# Patient Record
Sex: Female | Born: 1955 | Race: Black or African American | Hispanic: No | Smoking: Current every day smoker
Health system: Southern US, Community
[De-identification: ages and names within clinical notes are randomized; demographics above are authoritative.]

## PROBLEM LIST (undated history)

## (undated) DIAGNOSIS — I10 Essential (primary) hypertension: Secondary | ICD-10-CM

## (undated) DIAGNOSIS — G35 Multiple sclerosis: Secondary | ICD-10-CM

## (undated) DIAGNOSIS — M810 Age-related osteoporosis without current pathological fracture: Secondary | ICD-10-CM

---

## 1999-02-16 ENCOUNTER — Encounter: Payer: Self-pay | Admitting: Family Medicine

## 1999-02-16 ENCOUNTER — Ambulatory Visit (HOSPITAL_COMMUNITY): Admission: RE | Admit: 1999-02-16 | Discharge: 1999-02-16 | Payer: Self-pay | Admitting: Pediatrics

## 1999-03-19 ENCOUNTER — Ambulatory Visit (HOSPITAL_COMMUNITY): Admission: RE | Admit: 1999-03-19 | Discharge: 1999-03-19 | Payer: Self-pay | Admitting: Family Medicine

## 1999-05-08 ENCOUNTER — Encounter: Payer: Self-pay | Admitting: Neurology

## 1999-05-08 ENCOUNTER — Ambulatory Visit (HOSPITAL_COMMUNITY): Admission: RE | Admit: 1999-05-08 | Discharge: 1999-05-08 | Payer: Self-pay | Admitting: Neurology

## 2012-08-06 DIAGNOSIS — J449 Chronic obstructive pulmonary disease, unspecified: Secondary | ICD-10-CM | POA: Insufficient documentation

## 2012-08-06 DIAGNOSIS — E781 Pure hyperglyceridemia: Secondary | ICD-10-CM | POA: Insufficient documentation

## 2012-08-06 DIAGNOSIS — I10 Essential (primary) hypertension: Secondary | ICD-10-CM | POA: Insufficient documentation

## 2015-10-07 DIAGNOSIS — G500 Trigeminal neuralgia: Secondary | ICD-10-CM | POA: Diagnosis present

## 2016-01-13 DIAGNOSIS — G4700 Insomnia, unspecified: Secondary | ICD-10-CM | POA: Insufficient documentation

## 2016-01-13 DIAGNOSIS — I6930 Unspecified sequelae of cerebral infarction: Secondary | ICD-10-CM | POA: Insufficient documentation

## 2016-01-13 DIAGNOSIS — F17200 Nicotine dependence, unspecified, uncomplicated: Secondary | ICD-10-CM | POA: Insufficient documentation

## 2016-03-11 DIAGNOSIS — M722 Plantar fascial fibromatosis: Secondary | ICD-10-CM | POA: Insufficient documentation

## 2016-09-12 DIAGNOSIS — S4292XA Fracture of left shoulder girdle, part unspecified, initial encounter for closed fracture: Secondary | ICD-10-CM | POA: Insufficient documentation

## 2016-11-23 DIAGNOSIS — M810 Age-related osteoporosis without current pathological fracture: Secondary | ICD-10-CM | POA: Insufficient documentation

## 2016-11-23 DIAGNOSIS — M14671 Charcot's joint, right ankle and foot: Secondary | ICD-10-CM | POA: Insufficient documentation

## 2017-05-18 DIAGNOSIS — Z87311 Personal history of (healed) other pathological fracture: Secondary | ICD-10-CM | POA: Insufficient documentation

## 2017-05-18 DIAGNOSIS — E559 Vitamin D deficiency, unspecified: Secondary | ICD-10-CM | POA: Insufficient documentation

## 2018-03-22 ENCOUNTER — Emergency Department (HOSPITAL_BASED_OUTPATIENT_CLINIC_OR_DEPARTMENT_OTHER)
Admission: EM | Admit: 2018-03-22 | Discharge: 2018-03-22 | Disposition: A | Discharge status: Home / Self Care | Payer: Medicare HMO | Attending: Emergency Medicine | Admitting: Emergency Medicine

## 2018-03-22 ENCOUNTER — Other Ambulatory Visit: Payer: Self-pay

## 2018-03-22 ENCOUNTER — Encounter (HOSPITAL_BASED_OUTPATIENT_CLINIC_OR_DEPARTMENT_OTHER): Payer: Self-pay | Admitting: Emergency Medicine

## 2018-03-22 DIAGNOSIS — H5712 Ocular pain, left eye: Secondary | ICD-10-CM | POA: Diagnosis present

## 2018-03-22 DIAGNOSIS — F17210 Nicotine dependence, cigarettes, uncomplicated: Secondary | ICD-10-CM | POA: Insufficient documentation

## 2018-03-22 HISTORY — DX: Age-related osteoporosis without current pathological fracture: M81.0

## 2018-03-22 HISTORY — DX: Multiple sclerosis: G35

## 2018-03-22 MED ORDER — TRAMADOL HCL 50 MG PO TABS
50.0000 mg | ORAL_TABLET | Freq: Once | ORAL | Status: AC
  Administered 2018-03-22: 50 mg via ORAL
  Filled 2018-03-22: qty 1

## 2018-03-22 MED ORDER — PROPARACAINE HCL 0.5 % OP SOLN
1.0000 [drp] | Freq: Once | OPHTHALMIC | Status: AC
  Administered 2018-03-22: 1 [drp] via OPHTHALMIC
  Filled 2018-03-22: qty 15

## 2018-03-22 NOTE — ED Provider Notes (Signed)
[facility] EMERGENCY DEPARTMENT Provider Note   CSN: [id number] Arrival date & time: 03/22/18  1502     History   Chief Complaint Chief Complaint  Patient presents with  . Eye Pain    HPI Kerin Ransomva G Stanko is a 63 y.o. female.  The history is provided by the patient.  Eye Pain  This is a recurrent problem. The current episode started more than 2 days ago. The problem occurs daily. Progression since onset: intermittent. Pertinent negatives include no chest pain, no abdominal pain, no headaches and no shortness of breath. Exacerbated by: light. Nothing relieves the symptoms. Treatments tried: on prednisone. The treatment provided mild relief.    Past Medical History:  Diagnosis Date  . MS (multiple sclerosis) (HCC)   . Osteoporosis     There are no active problems to display for this patient.   History reviewed. No pertinent surgical history.   OB History   No obstetric history on file.      Home Medications    Prior to Admission medications   Not on File    Family History History reviewed. No pertinent family history.  Social History Social History   Tobacco Use  . Smoking status: Current Every Day Smoker    Packs/day: 0.50    Types: Cigarettes  . Smokeless tobacco: Never Used  Substance Use Topics  . Alcohol use: Never    Frequency: Never  . Drug use: Never     Allergies   Penicillins and Sulfa antibiotics   Review of Systems Review of Systems  Constitutional: Negative for chills and fever.  HENT: Negative for ear pain and sore throat.   Eyes: Positive for photophobia and pain. Negative for discharge, redness, itching and visual disturbance.  Respiratory: Negative for cough and shortness of breath.   Cardiovascular: Negative for chest pain and palpitations.  Gastrointestinal: Negative for abdominal pain and vomiting.  Genitourinary: Negative for dysuria and hematuria.  Musculoskeletal: Negative for arthralgias and back pain.  Skin:  Negative for color change and rash.  Neurological: Negative for seizures, syncope and headaches.  All other systems reviewed and are negative.    Physical Exam Updated Vital Signs BP (!) 164/90 (BP Location: Right Arm)   Pulse 72   Temp 97.8 F (36.6 C) (Oral)   Resp 16   Ht 5\' 3"  (1.6 m)   Wt 49 kg   SpO2 98%   BMI 19.13 kg/m   Physical Exam Vitals signs and nursing note reviewed.  Constitutional:      General: She is not in acute distress.    Appearance: She is well-developed.  HENT:     Head: Normocephalic and atraumatic.  Eyes:     General: Lids are normal.        Right eye: No discharge.        Left eye: No foreign body or discharge.     Intraocular pressure: Left eye pressure is 20 mmHg. Measurements were taken using a handheld tonometer.    Extraocular Movements: Extraocular movements intact.     Right eye: Normal extraocular motion.     Left eye: Normal extraocular motion.     Conjunctiva/sclera: Conjunctivae normal.     Right eye: Right conjunctiva is not injected. No chemosis.    Left eye: Left conjunctiva is not injected. No chemosis.    Pupils: Pupils are equal, round, and reactive to light.     Visual Fields: Right eye visual fields normal and left eye visual fields normal.  Comments: 20/50 vision b/l without glasses  Neck:     Musculoskeletal: Neck supple.  Cardiovascular:     Rate and Rhythm: Normal rate and regular rhythm.     Heart sounds: No murmur.  Pulmonary:     Effort: Pulmonary effort is normal. No respiratory distress.     Breath sounds: Normal breath sounds.  Abdominal:     Palpations: Abdomen is soft.     Tenderness: There is no abdominal tenderness.  Skin:    General: Skin is warm and dry.  Neurological:     Mental Status: She is alert.      ED Treatments / Results  Labs (all labs ordered are listed, but only abnormal results are displayed) Labs Reviewed - No data to display  EKG None  Radiology No results  found.  Procedures Procedures (including critical care time)  Medications Ordered in ED Medications  traMADol (ULTRAM) tablet 50 mg (has no administration in time range)  proparacaine (ALCAINE) 0.5 % ophthalmic solution 1 drop (1 drop Left Eye Given 03/22/18 1531)     Initial Impression / Assessment and Plan / ED Course  I have reviewed the triage vital signs and the nursing notes.  Pertinent labs & imaging results that were available during my care of the patient were reviewed by me and considered in my medical decision making (see chart for details).     ZYKERRIA GIBILISCO is a 63 year old female history of MS, trigeminal neuralgia who presents to the ED with left-sided head pain, left-sided eye pain.  Patient with normal vitals.  No fever.  Patient seen yesterday at outside emergency department for similar symptoms.  Was started on prednisone and was to follow-up with her ophthalmologist today.  Patient is being treated for trigeminal neuralgia.  She has no new vision changes.  She has normal pressure in the left eye.  She states that pain is similar to prior trigeminal neuralgia attacks.  Has used prednisone, tramadol in the past but does not have any tramadol.  Patient states that pain is intermittent.  She denies any loss of vision.  No history of any trauma to the eye.  She has no jaw pain.  No tenderness over the temporal artery.  Doubt temporal arteritis.  No fixed or dilated pupil.  No chemosis.  Normal eye pressure and doubt acute angle glaucoma.  Patient was given tramadol.  Encouraged her to follow-up with her ophthalmologist as well as previously instructed.  She would benefit from a dilated eye exam.  Likely trigeminal neuralgia.  Discharged from the ED in good condition given return precautions.  This chart was dictated using voice recognition software.  Despite best efforts to proofread,  errors can occur which can change the documentation meaning.   Final Clinical Impressions(s) /  ED Diagnoses   Final diagnoses:  Pain of left eye    ED Discharge Orders    None       Virgina Norfolk, DO 03/22/18 1552

## 2018-03-22 NOTE — ED Notes (Signed)
Discharge by Lupe Carney, RN

## 2018-03-22 NOTE — ED Triage Notes (Signed)
Reports left eye pain with history of MS.  States this episode started yesterday.  Reports photophobia.

## 2018-06-18 ENCOUNTER — Inpatient Hospital Stay: Admit: 2018-06-18 | Payer: Medicare HMO | Admitting: Orthopedic Surgery

## 2018-06-18 ENCOUNTER — Encounter (HOSPITAL_BASED_OUTPATIENT_CLINIC_OR_DEPARTMENT_OTHER): Payer: Self-pay | Admitting: Emergency Medicine

## 2018-06-18 ENCOUNTER — Observation Stay (HOSPITAL_BASED_OUTPATIENT_CLINIC_OR_DEPARTMENT_OTHER)
Admission: EM | Admit: 2018-06-18 | Discharge: 2018-06-19 | Disposition: A | Discharge status: Home / Self Care | Payer: Medicare HMO | Attending: Orthopedic Surgery | Admitting: Orthopedic Surgery

## 2018-06-18 ENCOUNTER — Encounter (HOSPITAL_COMMUNITY)
Admission: EM | Disposition: A | Discharge status: Home / Self Care | Payer: Self-pay | Source: Home / Self Care | Attending: Emergency Medicine

## 2018-06-18 ENCOUNTER — Other Ambulatory Visit: Payer: Self-pay

## 2018-06-18 ENCOUNTER — Emergency Department (HOSPITAL_BASED_OUTPATIENT_CLINIC_OR_DEPARTMENT_OTHER): Payer: Medicare HMO

## 2018-06-18 ENCOUNTER — Emergency Department (HOSPITAL_COMMUNITY): Payer: Medicare HMO | Admitting: Anesthesiology

## 2018-06-18 DIAGNOSIS — M810 Age-related osteoporosis without current pathological fracture: Secondary | ICD-10-CM | POA: Diagnosis not present

## 2018-06-18 DIAGNOSIS — F17210 Nicotine dependence, cigarettes, uncomplicated: Secondary | ICD-10-CM | POA: Diagnosis not present

## 2018-06-18 DIAGNOSIS — S62345A Nondisplaced fracture of base of fourth metacarpal bone, left hand, initial encounter for closed fracture: Secondary | ICD-10-CM | POA: Diagnosis present

## 2018-06-18 DIAGNOSIS — W06XXXA Fall from bed, initial encounter: Secondary | ICD-10-CM | POA: Insufficient documentation

## 2018-06-18 DIAGNOSIS — Z882 Allergy status to sulfonamides status: Secondary | ICD-10-CM | POA: Diagnosis not present

## 2018-06-18 DIAGNOSIS — S63269A Dislocation of metacarpophalangeal joint of unspecified finger, initial encounter: Secondary | ICD-10-CM

## 2018-06-18 DIAGNOSIS — S63261A Dislocation of metacarpophalangeal joint of left index finger, initial encounter: Principal | ICD-10-CM | POA: Insufficient documentation

## 2018-06-18 DIAGNOSIS — S6292XA Unspecified fracture of left wrist and hand, initial encounter for closed fracture: Secondary | ICD-10-CM | POA: Diagnosis present

## 2018-06-18 DIAGNOSIS — G35 Multiple sclerosis: Secondary | ICD-10-CM | POA: Diagnosis not present

## 2018-06-18 DIAGNOSIS — S62309A Unspecified fracture of unspecified metacarpal bone, initial encounter for closed fracture: Secondary | ICD-10-CM | POA: Diagnosis present

## 2018-06-18 DIAGNOSIS — Z880 Allergy status to penicillin: Secondary | ICD-10-CM | POA: Diagnosis not present

## 2018-06-18 DIAGNOSIS — S62323A Displaced fracture of shaft of third metacarpal bone, left hand, initial encounter for closed fracture: Secondary | ICD-10-CM | POA: Diagnosis present

## 2018-06-18 DIAGNOSIS — S52502A Unspecified fracture of the lower end of left radius, initial encounter for closed fracture: Secondary | ICD-10-CM

## 2018-06-18 HISTORY — PX: OPEN REDUCTION INTERNAL FIXATION (ORIF) METACARPAL: SHX6234

## 2018-06-18 SURGERY — OPEN REDUCTION INTERNAL FIXATION (ORIF) METACARPAL
Anesthesia: General | Laterality: Left

## 2018-06-18 MED ORDER — VANCOMYCIN HCL 1000 MG IV SOLR
INTRAVENOUS | Status: DC | PRN
  Administered 2018-06-18: 1000 mg via INTRAVENOUS

## 2018-06-18 MED ORDER — LACTATED RINGERS IV SOLN
INTRAVENOUS | Status: DC | PRN
  Administered 2018-06-18 (×2): via INTRAVENOUS

## 2018-06-18 MED ORDER — MIDAZOLAM HCL 2 MG/2ML IJ SOLN
INTRAMUSCULAR | Status: AC
  Filled 2018-06-18: qty 2

## 2018-06-18 MED ORDER — PROPOFOL 10 MG/ML IV BOLUS
INTRAVENOUS | Status: DC | PRN
  Administered 2018-06-18: 150 mg via INTRAVENOUS
  Administered 2018-06-18 (×2): 50 mg via INTRAVENOUS

## 2018-06-18 MED ORDER — FENTANYL CITRATE (PF) 250 MCG/5ML IJ SOLN
INTRAMUSCULAR | Status: AC
  Filled 2018-06-18: qty 5

## 2018-06-18 MED ORDER — BUPIVACAINE HCL (PF) 0.25 % IJ SOLN
INTRAMUSCULAR | Status: AC
  Filled 2018-06-18: qty 30

## 2018-06-18 MED ORDER — ONDANSETRON HCL 4 MG/2ML IJ SOLN
INTRAMUSCULAR | Status: DC | PRN
  Administered 2018-06-18: 4 mg via INTRAVENOUS

## 2018-06-18 MED ORDER — PROPOFOL 10 MG/ML IV BOLUS
INTRAVENOUS | Status: AC
  Filled 2018-06-18: qty 20

## 2018-06-18 MED ORDER — DEXAMETHASONE SODIUM PHOSPHATE 10 MG/ML IJ SOLN
INTRAMUSCULAR | Status: AC
  Filled 2018-06-18: qty 1

## 2018-06-18 MED ORDER — ALBUTEROL SULFATE (2.5 MG/3ML) 0.083% IN NEBU
INHALATION_SOLUTION | RESPIRATORY_TRACT | Status: AC
  Administered 2018-06-18: 2.5 mg via RESPIRATORY_TRACT
  Filled 2018-06-18: qty 3

## 2018-06-18 MED ORDER — 0.9 % SODIUM CHLORIDE (POUR BTL) OPTIME
TOPICAL | Status: DC | PRN
  Administered 2018-06-18: 1000 mL

## 2018-06-18 MED ORDER — LIDOCAINE HCL (PF) 1 % IJ SOLN
30.0000 mL | Freq: Once | INTRAMUSCULAR | Status: AC
  Administered 2018-06-18: 30 mL

## 2018-06-18 MED ORDER — HYDRALAZINE HCL 20 MG/ML IJ SOLN
5.0000 mg | INTRAMUSCULAR | Status: AC | PRN
  Administered 2018-06-18 – 2018-06-19 (×2): 2.5 mg via INTRAVENOUS

## 2018-06-18 MED ORDER — FENTANYL CITRATE (PF) 100 MCG/2ML IJ SOLN
INTRAMUSCULAR | Status: AC
  Administered 2018-06-18: 25 ug via INTRAVENOUS
  Filled 2018-06-18: qty 2

## 2018-06-18 MED ORDER — HYDROCODONE-ACETAMINOPHEN 5-325 MG PO TABS
1.0000 | ORAL_TABLET | Freq: Once | ORAL | Status: AC
  Administered 2018-06-18: 1 via ORAL
  Filled 2018-06-18: qty 1

## 2018-06-18 MED ORDER — SUCCINYLCHOLINE CHLORIDE 200 MG/10ML IV SOSY
PREFILLED_SYRINGE | INTRAVENOUS | Status: DC | PRN
  Administered 2018-06-18: 120 mg via INTRAVENOUS

## 2018-06-18 MED ORDER — DEXAMETHASONE SODIUM PHOSPHATE 10 MG/ML IJ SOLN
INTRAMUSCULAR | Status: DC | PRN
  Administered 2018-06-18: 10 mg via INTRAVENOUS

## 2018-06-18 MED ORDER — LIDOCAINE HCL (PF) 1 % IJ SOLN
5.0000 mL | Freq: Once | INTRAMUSCULAR | Status: AC
  Administered 2018-06-18: 5 mL
  Filled 2018-06-18: qty 5

## 2018-06-18 MED ORDER — METHOCARBAMOL 1000 MG/10ML IJ SOLN
500.0000 mg | Freq: Four times a day (QID) | INTRAVENOUS | Status: DC | PRN
  Filled 2018-06-18: qty 5

## 2018-06-18 MED ORDER — FENTANYL CITRATE (PF) 100 MCG/2ML IJ SOLN
25.0000 ug | INTRAMUSCULAR | Status: DC | PRN
  Administered 2018-06-18 – 2018-06-19 (×2): 25 ug via INTRAVENOUS

## 2018-06-18 MED ORDER — LIDOCAINE 2% (20 MG/ML) 5 ML SYRINGE
INTRAMUSCULAR | Status: AC
  Filled 2018-06-18: qty 5

## 2018-06-18 MED ORDER — METHOCARBAMOL 500 MG PO TABS: 500.0000 mg | ORAL_TABLET | Freq: Four times a day (QID) | ORAL | Status: DC | PRN

## 2018-06-18 MED ORDER — HYDRALAZINE HCL 20 MG/ML IJ SOLN
INTRAMUSCULAR | Status: AC
  Administered 2018-06-19: 2.5 mg via INTRAVENOUS
  Filled 2018-06-18: qty 1

## 2018-06-18 MED ORDER — VANCOMYCIN HCL IN DEXTROSE 1-5 GM/200ML-% IV SOLN
INTRAVENOUS | Status: AC
  Filled 2018-06-18: qty 200

## 2018-06-18 MED ORDER — LACTATED RINGERS IV SOLN
INTRAVENOUS | Status: DC
  Administered 2018-06-19: 05:00:00 via INTRAVENOUS

## 2018-06-18 MED ORDER — SUCCINYLCHOLINE CHLORIDE 200 MG/10ML IV SOSY
PREFILLED_SYRINGE | INTRAVENOUS | Status: AC
  Filled 2018-06-18: qty 10

## 2018-06-18 MED ORDER — FENTANYL CITRATE (PF) 100 MCG/2ML IJ SOLN
INTRAMUSCULAR | Status: DC | PRN
  Administered 2018-06-18 (×3): 50 ug via INTRAVENOUS
  Administered 2018-06-18: 100 ug via INTRAVENOUS

## 2018-06-18 MED ORDER — MIDAZOLAM HCL 5 MG/5ML IJ SOLN
INTRAMUSCULAR | Status: DC | PRN
  Administered 2018-06-18: 2 mg via INTRAVENOUS

## 2018-06-18 MED ORDER — VANCOMYCIN HCL 1000 MG IV SOLR
INTRAVENOUS | Status: AC
  Filled 2018-06-18: qty 1000

## 2018-06-18 MED ORDER — BUPIVACAINE HCL 0.5 % IJ SOLN
50.0000 mL | Freq: Once | INTRAMUSCULAR | Status: AC
  Administered 2018-06-18: 50 mL
  Filled 2018-06-18: qty 1

## 2018-06-18 MED ORDER — LIDOCAINE 2% (20 MG/ML) 5 ML SYRINGE
INTRAMUSCULAR | Status: DC | PRN
  Administered 2018-06-18: 100 mg via INTRAVENOUS

## 2018-06-18 MED ORDER — ALBUTEROL SULFATE (2.5 MG/3ML) 0.083% IN NEBU
2.5000 mg | INHALATION_SOLUTION | Freq: Four times a day (QID) | RESPIRATORY_TRACT | Status: AC | PRN
  Administered 2018-06-18: 2.5 mg via RESPIRATORY_TRACT

## 2018-06-18 SURGICAL SUPPLY — 79 items
BANDAGE ACE 3X5.8 VEL STRL LF (GAUZE/BANDAGES/DRESSINGS) ×3 IMPLANT
BANDAGE ACE 4X5 VEL STRL LF (GAUZE/BANDAGES/DRESSINGS) ×3 IMPLANT
BIT DRILL 2 FAST STEP (BIT) ×3 IMPLANT
BLADE CLIPPER SURG (BLADE) IMPLANT
BNDG ESMARK 4X9 LF (GAUZE/BANDAGES/DRESSINGS) ×3 IMPLANT
BNDG GAUZE ELAST 4 BULKY (GAUZE/BANDAGES/DRESSINGS) ×3 IMPLANT
CORDS BIPOLAR (ELECTRODE) ×3 IMPLANT
COVER SURGICAL LIGHT HANDLE (MISCELLANEOUS) ×3 IMPLANT
COVER WAND RF STERILE (DRAPES) ×3 IMPLANT
CUFF TOURNIQUET SINGLE 18IN (TOURNIQUET CUFF) ×3 IMPLANT
CUFF TOURNIQUET SINGLE 24IN (TOURNIQUET CUFF) IMPLANT
DRAIN TLS ROUND 10FR (DRAIN) IMPLANT
DRAPE OEC MINIVIEW 54X84 (DRAPES) IMPLANT
DRAPE SURG 17X23 STRL (DRAPES) ×3 IMPLANT
DRSG ADAPTIC 3X8 NADH LF (GAUZE/BANDAGES/DRESSINGS) ×3 IMPLANT
DRSG XEROFORM 1X8 (GAUZE/BANDAGES/DRESSINGS) ×3 IMPLANT
GAUZE SPONGE 4X4 12PLY STRL (GAUZE/BANDAGES/DRESSINGS) ×3 IMPLANT
GAUZE SPONGE 4X4 12PLY STRL LF (GAUZE/BANDAGES/DRESSINGS) ×3 IMPLANT
GAUZE XEROFORM 1X8 LF (GAUZE/BANDAGES/DRESSINGS) ×3 IMPLANT
GLOVE BIO SURGEON STRL SZ 6.5 (GLOVE) ×6 IMPLANT
GLOVE BIO SURGEON STRL SZ7 (GLOVE) ×6 IMPLANT
GLOVE BIO SURGEONS STRL SZ 6.5 (GLOVE) ×3
GLOVE BIOGEL M 8.0 STRL (GLOVE) ×3 IMPLANT
GLOVE BIOGEL PI IND STRL 6.5 (GLOVE) ×3 IMPLANT
GLOVE BIOGEL PI IND STRL 7.0 (GLOVE) ×1 IMPLANT
GLOVE BIOGEL PI INDICATOR 6.5 (GLOVE) ×6
GLOVE BIOGEL PI INDICATOR 7.0 (GLOVE) ×2
GLOVE SS BIOGEL STRL SZ 8 (GLOVE) ×1 IMPLANT
GLOVE SUPERSENSE BIOGEL SZ 8 (GLOVE) ×2
GLOVE SURG SS PI 7.0 STRL IVOR (GLOVE) ×3 IMPLANT
GOWN STRL REUS W/ TWL LRG LVL3 (GOWN DISPOSABLE) ×3 IMPLANT
GOWN STRL REUS W/ TWL XL LVL3 (GOWN DISPOSABLE) ×3 IMPLANT
GOWN STRL REUS W/TWL LRG LVL3 (GOWN DISPOSABLE) ×6
GOWN STRL REUS W/TWL XL LVL3 (GOWN DISPOSABLE) ×6
KIT BASIN OR (CUSTOM PROCEDURE TRAY) ×3 IMPLANT
KIT TURNOVER KIT B (KITS) ×3 IMPLANT
MANIFOLD NEPTUNE II (INSTRUMENTS) ×3 IMPLANT
NEEDLE 22X1 1/2 (OR ONLY) (NEEDLE) ×3 IMPLANT
NS IRRIG 1000ML POUR BTL (IV SOLUTION) ×3 IMPLANT
PACK ORTHO EXTREMITY (CUSTOM PROCEDURE TRAY) ×3 IMPLANT
PAD ARMBOARD 7.5X6 YLW CONV (MISCELLANEOUS) ×6 IMPLANT
PAD CAST 3X4 CTTN HI CHSV (CAST SUPPLIES) ×1 IMPLANT
PAD CAST 4YDX4 CTTN HI CHSV (CAST SUPPLIES) ×1 IMPLANT
PADDING CAST COTTON 3X4 STRL (CAST SUPPLIES) ×2
PADDING CAST COTTON 4X4 STRL (CAST SUPPLIES) ×2
PADDING CAST SYNTHETIC 4 (CAST SUPPLIES) ×2
PADDING CAST SYNTHETIC 4X4 STR (CAST SUPPLIES) ×1 IMPLANT
PILLOW ARM CARTER ADULT (MISCELLANEOUS) ×3 IMPLANT
PLATE LOCK 2.5MM Y-SHAPE (Plate) ×3 IMPLANT
PLATE LOCKING 1.5 Y SHAPE (Plate) ×3 IMPLANT
SCREW PEG 15MM (Screw) ×3 IMPLANT
SCREW PEG 2.5X12 NONLOCK (Screw) ×3 IMPLANT
SCREW PEG 2.5X20 NONLOCK (Screw) ×3 IMPLANT
SCREW PEG BONE N/L 2.5X11 (Screw) ×3 IMPLANT
SCREW PEG LOCK 2.5X10 (Peg) ×3 IMPLANT
SCREW PEG LOCK 2.5X12 (Screw) ×3 IMPLANT
SCREW PEG LOCK 2.5X16 (Peg) ×3 IMPLANT
SCREWDRIVER BIT 2.0/2.5 127MM (BIT) ×3 IMPLANT
SCRUB BETADINE 4OZ XXX (MISCELLANEOUS) ×3 IMPLANT
SOL PREP POV-IOD 4OZ 10% (MISCELLANEOUS) ×3 IMPLANT
SPLINT FIBERGLASS 4X30 (CAST SUPPLIES) ×3 IMPLANT
SPONGE LAP 4X18 RFD (DISPOSABLE) IMPLANT
SUT FIBERWIRE 4-0 18 TAPR NDL (SUTURE) ×3
SUT MNCRL AB 4-0 PS2 18 (SUTURE) ×3 IMPLANT
SUT PROLENE 3 0 PS 2 (SUTURE) IMPLANT
SUT PROLENE 4 0 PS 2 18 (SUTURE) ×12 IMPLANT
SUT VIC AB 3-0 FS2 27 (SUTURE) IMPLANT
SUT VIC AB 4-0 P-3 18X BRD (SUTURE) ×2 IMPLANT
SUT VIC AB 4-0 P3 18 (SUTURE) ×4
SUTURE FIBERWR 4-0 18 TAPR NDL (SUTURE) ×1 IMPLANT
SYR CONTROL 10ML LL (SYRINGE) ×3 IMPLANT
SYSTEM CHEST DRAIN TLS 7FR (DRAIN) IMPLANT
TOWEL OR 17X24 6PK STRL BLUE (TOWEL DISPOSABLE) ×3 IMPLANT
TOWEL OR 17X26 10 PK STRL BLUE (TOWEL DISPOSABLE) ×3 IMPLANT
TUBE CONNECTING 12'X1/4 (SUCTIONS) ×1
TUBE CONNECTING 12X1/4 (SUCTIONS) ×2 IMPLANT
TUBE EVACUATION TLS (MISCELLANEOUS) ×3 IMPLANT
WASHER 2.5 THREADED (Orthopedic Implant) ×3 IMPLANT
WATER STERILE IRR 1000ML POUR (IV SOLUTION) ×3 IMPLANT

## 2018-06-18 NOTE — ED Notes (Signed)
Contact made with pt's caregiver. Caregiver to come transport pt by POV to [facility].

## 2018-06-18 NOTE — ED Provider Notes (Signed)
[facility] EMERGENCY DEPARTMENT Provider Note   CSN: [id number] Arrival date & time: 06/18/18  1536    History   Chief Complaint Chief Complaint  Patient presents with  . Hand Injury    HPI Shawna Smith is a 63 y.o. female.     63 year old female presents with left hand pain.  Patient states that she was going to get out of bed last night and fell when she was trying to turn on her bedside light injuring her left hand.  Patient is right-hand dominant, ambulates with a cane in her right hand.  Patient is not on blood thinners, denies loss of consciousness, did not hit her head.  Pain primarily to her left index finger and neck and metacarpal.  No other injuries, complaints, concerns.     Past Medical History:  Diagnosis Date  . MS (multiple sclerosis) (HCC)   . Osteoporosis     There are no active problems to display for this patient.   History reviewed. No pertinent surgical history.   OB History   No obstetric history on file.      Home Medications    Prior to Admission medications   Not on File    Family History No family history on file.  Social History Social History   Tobacco Use  . Smoking status: Current Every Day Smoker    Packs/day: 0.50    Types: Cigarettes  . Smokeless tobacco: Never Used  Substance Use Topics  . Alcohol use: Never    Frequency: Never  . Drug use: Never     Allergies   Penicillins and Sulfa antibiotics   Review of Systems Review of Systems  Constitutional: Negative for fever.  Musculoskeletal: Positive for arthralgias and joint swelling.  Skin: Negative for color change, rash and wound.  Allergic/Immunologic: Negative for immunocompromised state.  Neurological: Negative for weakness and numbness.  Hematological: Does not bruise/bleed easily.  Psychiatric/Behavioral: Negative for confusion.  All other systems reviewed and are negative.    Physical Exam Updated Vital Signs BP (!) 202/97 (BP  Location: Right Arm)   Pulse 82   Temp 98.3 F (36.8 C) (Oral)   Resp 18   Ht  (1.727 m)   Wt 54.4 kg   SpO2 100%   BMI 18.25 kg/m   Physical Exam Vitals signs and nursing note reviewed.  Constitutional:      General: She is not in acute distress.    Appearance: She is well-developed. She is not diaphoretic.  HENT:     Head: Normocephalic and atraumatic.  Cardiovascular:     Pulses: Normal pulses.  Pulmonary:     Effort: Pulmonary effort is normal.  Musculoskeletal:        General: Swelling, tenderness, deformity and signs of injury present.       Hands:  Skin:    General: Skin is warm and dry.     Findings: Bruising present. No erythema or rash.  Neurological:     Mental Status: She is alert and oriented to person, place, and time.  Psychiatric:        Behavior: Behavior normal.      ED Treatments / Results  Labs (all labs ordered are listed, but only abnormal results are displayed) Labs Reviewed - No data to display  EKG None  Radiology Dg Hand Complete Left  Result Date: 06/18/2018 CLINICAL DATA:  63 year old female status post fall with left hand swelling and deformity. EXAM: LEFT HAND - COMPLETE 3+  VIEW COMPARISON:  Left wrist 11/10/2016. FINDINGS: Two thousand eighteen nondisplaced distal left radius fracture healing is felt to explain the irregular sclerosis in the distal left radius metadiaphysis since that time. The distal left radius and ulna now appear intact. Carpal bone alignment stable and within normal limits. There is a comminuted oblique fracture of the 4th metacarpal proximal metadiaphysis with volar angulation. The appearance is suspicious for nondisplaced extension to the 4th [facility] joint. Oblique comminuted fracture of the left 3rd metacarpal distal metadiaphysis, extra-articular. Mild ulnar displacement. Dorsal and ulnar dislocation of the left 2nd MCP joint, although a small round ossific fragment there is a small chronic sesamoid rather than  an acute fracture fragment. The 1st and 5th metacarpals are stable and intact. No phalanx fracture identified. IMPRESSION: 1. Dislocation of the 2nd MCP joint without acute fracture identified there. 2. Comminuted and mildly displaced and angulated fractures of the distal 3rd metacarpal (extra-articular) and proximal 4th metacarpal (probably intra-articular). 3. Remote distal left radius fracture. Electronically Signed   By: Odessa Fleming M.D.   On: 06/18/2018 16:31   Dg Finger Index Left  Result Date: 06/18/2018 CLINICAL DATA:  Post reduction films. EXAM: LEFT INDEX FINGER 2+V COMPARISON:  June 18, 2018 hand films. FINDINGS: There is continued dislocation of the index finger at the MCP joint. Fractures of the third and fourth metacarpals remain. IMPRESSION: Continued dislocation of the index finger at the MCP joint. Fractures of the third and fourth metacarpals remain. Electronically Signed   By: Gerome Sam III M.D   On: 06/18/2018 17:18    Procedures Procedures (including critical care time)  Medications Ordered in ED Medications  lidocaine (PF) (XYLOCAINE) 1 % injection 5 mL (5 mLs Infiltration Given 06/18/18 1649)  HYDROcodone-acetaminophen (NORCO/VICODIN) 5-325 MG per tablet 1 tablet (1 tablet Oral Given 06/18/18 1710)  lidocaine (PF) (XYLOCAINE) 1 % injection 30 mL (30 mLs Infiltration Given by Other 06/18/18 1715)  bupivacaine (MARCAINE) 0.5 % (with pres) injection 50 mL (50 mLs Infiltration Given by Other 06/18/18 1715)     Initial Impression / Assessment and Plan / ED Course  I have reviewed the triage vital signs and the nursing notes.  Pertinent labs & imaging results that were available during my care of the patient were reviewed by me and considered in my medical decision making (see chart for details).  Clinical Course as of Jun 18 1755  Sat Jun 18, 2018  1755 62yo female left hand injury after a fall which occurred last night.  On exam patient has swelling of her left hand with  deformity at the left second MCP, she is neurovascular intact.  X-ray shows dislocation at the left second MCP with fractures of the third and fourth metacarpals, distal radius.  Digital block to left second MCP with unsuccessful reduction attempt x2. Hematoma block done by Dr. Silverio Lay, ER attending with US guidance, block successful, unable to reduce dislocation. Care signed out pending ortho consult.    [LM]    Clinical Course User Index [LM] Jeannie Fend, PA-C        Final Clinical Impressions(s) / ED Diagnoses   Final diagnoses:  Dislocation, finger, metacarpophalangeal joint, initial encounter  Closed nondisplaced fracture of base of fourth metacarpal bone of left hand, initial encounter  Closed displaced fracture of shaft of third metacarpal bone of left hand, initial encounter  Closed fracture of distal end of left radius, unspecified fracture morphology, initial encounter    ED Discharge Orders    None  Jeannie FendMurphy,  A, PA-C 06/18/18 1757    Charlynne PanderYao, David Hsienta, MD 06/18/18 [phone]

## 2018-06-18 NOTE — Anesthesia Procedure Notes (Signed)
Procedure Name: Intubation Date/Time: 06/18/2018 9:27 PM Performed by: Babs Bertin, CRNA Pre-anesthesia Checklist: Patient identified, Emergency Drugs available, Suction available and Patient being monitored Patient Re-evaluated:Patient Re-evaluated prior to induction Oxygen Delivery Method: Circle System Utilized Preoxygenation: Pre-oxygenation with 100% oxygen Induction Type: IV induction Ventilation: Mask ventilation without difficulty Laryngoscope Size: Mac and 3 Grade View: Grade I Tube type: Oral Tube size: 7.0 mm Number of attempts: 1 Airway Equipment and Method: Stylet and Oral airway Placement Confirmation: ETT inserted through vocal cords under direct vision,  positive ETCO2 and breath sounds checked- equal and bilateral Secured at: 21 cm Tube secured with: Tape Dental Injury: Teeth and Oropharynx as per pre-operative assessment

## 2018-06-18 NOTE — ED Notes (Signed)
Pt ambulatory to front. Aware of her transfer POV to [facility].

## 2018-06-18 NOTE — H&P (Signed)
Shawna Smith is an 63 y.o. female.   Chief Complaint: 63 year old female presents with a reducible left index MCP dislocation, third and fourth metacarpal fractures displaced.  We are planning surgical reconstruction.    HPI: Patient presents for evaluation and treatment of the of their upper extremity predicament. The patient denies neck, back, chest or  abdominal pain. The patient notes that they have no lower extremity problems. The patients primary complaint is noted. We are planning surgical care pathway for the upper extremity.   The patient denies other complaints.  She fell yesterday and is been trying to endure the pain and swelling.  She was seen at [facility].  I asked him to transfer as soon as humanly possible so that we could give her some pain relief and correct her deformity.  She understands risk benefits surgery.  She states her multiple sclerosis is under fairly good control.  Past Medical History:  Diagnosis Date  . MS (multiple sclerosis) (HCC)   . Osteoporosis     History reviewed. No pertinent surgical history.  History reviewed. No pertinent family history. Social History:  reports that she has been smoking cigarettes. She has been smoking about 0.50 packs per day. She has never used smokeless tobacco. She reports that she does not drink alcohol or use drugs.  Allergies:  Allergies  Allergen Reactions  . Penicillins   . Sulfa Antibiotics     No medications prior to admission.    No results found for this or any previous visit (from the past 48 hour(s)). Dg Hand Complete Left  Result Date: 06/18/2018 CLINICAL DATA:  63 year old female status post fall with left hand swelling and deformity. EXAM: LEFT HAND - COMPLETE 3+ VIEW COMPARISON:  Left wrist 11/10/2016. FINDINGS: Two thousand eighteen nondisplaced distal left radius fracture healing is felt to explain the irregular sclerosis in the distal left radius metadiaphysis since that time. The  distal left radius and ulna now appear intact. Carpal bone alignment stable and within normal limits. There is a comminuted oblique fracture of the 4th metacarpal proximal metadiaphysis with volar angulation. The appearance is suspicious for nondisplaced extension to the 4th [facility] joint. Oblique comminuted fracture of the left 3rd metacarpal distal metadiaphysis, extra-articular. Mild ulnar displacement. Dorsal and ulnar dislocation of the left 2nd MCP joint, although a small round ossific fragment there is a small chronic sesamoid rather than an acute fracture fragment. The 1st and 5th metacarpals are stable and intact. No phalanx fracture identified. IMPRESSION: 1. Dislocation of the 2nd MCP joint without acute fracture identified there. 2. Comminuted and mildly displaced and angulated fractures of the distal 3rd metacarpal (extra-articular) and proximal 4th metacarpal (probably intra-articular). 3. Remote distal left radius fracture. Electronically Signed   By: Odessa Fleming M.D.   On: 06/18/2018 16:31   Dg Finger Index Left  Result Date: 06/18/2018 CLINICAL DATA:  Post reduction film. EXAM: LEFT INDEX FINGER 2+V COMPARISON:  Earlier same day FINDINGS: A single view of the index finger shows persistent dislocation of the proximal phalanx relative to the second metacarpal head. Position appear stem learn to the pre reduction film. Fractures in the distal third metacarpal again noted in the proximal fourth metacarpal fracture not well demonstrated on this film. IMPRESSION: Persistent second MCP joint dislocation. Electronically Signed   By: Kennith Center M.D.   On: 06/18/2018 18:18   Dg Finger Index Left  Result Date: 06/18/2018 CLINICAL DATA:  Post reduction films. EXAM: LEFT INDEX FINGER 2+V COMPARISON:  June 18, 2018 hand films. FINDINGS: There is continued dislocation of the index finger at the MCP joint. Fractures of the third and fourth metacarpals remain. IMPRESSION: Continued dislocation of the index  finger at the MCP joint. Fractures of the third and fourth metacarpals remain. Electronically Signed   By: Gerome Sam III M.D   On: 06/18/2018 17:18    Review of Systems  Respiratory: Negative.   Cardiovascular: Negative.   Gastrointestinal: Negative.   Genitourinary: Negative.     Blood pressure (!) 190/112, pulse 91, temperature 98.2 F (36.8 C), temperature source Oral, resp. rate 16, height 5\' 8"  (1.727 m), weight 54.4 kg, SpO2 97 %. Physical Exam  Irreducible index MCP dislocation left hand.  Displaced fourth and third metacarpal fractures left hand.  She is sensate has intact refill and a significant amount of swelling as expected given the greater than 24-hour old injury.  She denies other complaints.  I reviewed her x-rays and all findings.  I reviewed her chart in detail.  The patient is alert and oriented in no acute distress. The patient complains of pain in the affected upper extremity.  The patient is noted to have a normal HEENT exam. Lung fields show equal chest expansion and no shortness of breath. Abdomen exam is nontender without distention. Lower extremity examination does not show any fracture dislocation or blood clot symptoms. Pelvis is stable and the neck and back are stable and nontender. Assessment/Plan We will plan to proceed with open relocation of the index finger as well as repair reconstruction of the volar plate.  We will plan to proceed with ORIF as necessary third and fourth metacarpal fractures left hand.  We are planning surgery for your upper extremity. The risk and benefits of surgery to include risk of bleeding, infection, anesthesia,  damage to normal structures and failure of the surgery to accomplish its intended goals of relieving symptoms and restoring function have been discussed in detail. With this in mind we plan to proceed. I have specifically discussed with the patient the pre-and postoperative regime and the dos and don'ts and risk  and benefits in great detail. Risk and benefits of surgery also include risk of dystrophy(CRPS), chronic nerve pain, failure of the healing process to go onto completion and other inherent risks of surgery The relavent the pathophysiology of the disease/injury process, as well as the alternatives for treatment and postoperative course of action has been discussed in great detail with the patient who desires to proceed.  We will do everything in our power to help you (the patient) restore function to the upper extremity. It is a pleasure to see this patient today.   Oletta Cohn III, MD 06/18/2018, 9:12 PM

## 2018-06-18 NOTE — Transfer of Care (Signed)
Immediate Anesthesia Transfer of Care Note  Patient: Shawna Smith  Procedure(s) Performed: OPEN REDUCTION INTERNAL FIXATION (ORIF) LEFT HAND, MUTIPLE LEFT METACARPAL FRACTURES, AND OPEN REDUCTION OF MCP JOINT OF LEFT INDEX FINGER (Left )  Patient Location: PACU  Anesthesia Type:General  Level of Consciousness: awake and alert   Airway & Oxygen Therapy: Patient Spontanous Breathing and Patient connected to face mask oxygen  Post-op Assessment: Report given to RN and Post -op Vital signs reviewed and stable  Post vital signs: Reviewed and stable  Last Vitals:  Vitals Value Taken Time  BP 186/101 06/18/2018 11:21 PM  Temp    Pulse 95 06/18/2018 11:23 PM  Resp 22 06/18/2018 11:23 PM  SpO2 98 % 06/18/2018 11:23 PM  Vitals shown include unvalidated device data.  Last Pain:  Vitals:   06/18/18 2049  TempSrc: Oral  PainSc: 8          Complications: No apparent anesthesia complications

## 2018-06-18 NOTE — Op Note (Signed)
Operative note June 18, 2018  Dominica Severin MD  Preoperative diagnosis-#1- irreducible MCP dislocation left index finger #2-3rd metacarpal fracture displaced angulated left hand #3-4th metacarpal fracture displaced angulated left hand  Postop diagnosis: The same  Operative procedure: #1 open reduction irreducible MCP dislocation (metacarpal phalangeal joint) left index finger #2 left index finger volar plate repair MCP joint.  This was a volar plate/ligamentous repair MCP joint index finger left hand #3 extensive neuro lysis radial digital nerve left index finger volar approach #4 ORIF (open reduction internal fixation) third metacarpal fracture with 2.5 screw #5 open reduction internal fixation (ORIF) fourth metacarpal fracture left hand #6 stress radiography left hand 5 view  Surgeon Dominica Severin  Anesthesia General  Estimated blood loss minimal  Tourniquet time just at an hour  Indications pleasant 63 year old female with history of multiple sclerosis who had a fall yesterday and sustained the injuries in question as noted above.  She understands risk and benefits of surgery and desires to proceed with the operative procedure I discussed with her risk-benefit profiles and other issues germane to her predicament.  With this in mind we will proceed accordingly.  Operative procedure patient was seen by myself and anesthesia taken to the operative theater underwent a smooth induction of general anesthetic.  She was given preoperative vancomycin.  Tourniquet was in the deflated timeout observed and a volar radial modified Bruner incision was made about the index finger secondary to an irreducible dislocation.  The patient had the radial digital nerve identified this was wrapped around the head of the metacarpal.  I performed a neuro lysis of the radial digital nerve and retracted it very carefully.  It was disentangled from the neck of the metacarpal.  Following this I then performed  identification of the metacarpal head and the metacarpal neck.  I released the A1 and proximal portion of the A2 pulley to allow access for the volar plate.  I made a small incision in the volar plate which was trapdoor in the joint thus making it irreducible.  At this time I then with freer elevator reduce the area in question.  I checked this under AP lateral and oblique images and made sure all looked well.  The patient was reduced nicely and had fairly good stability.  This time I performed a volar plate repair.  Volar plate was repaired with 4-0 FiberWire.  Portions of the ligamentous and collateral/volar plate investments were tied down with the FiberWire to create a normal semblance of anatomy.  Following this patient was stress tested and final x-rays were taken which look good.  Tourniquet was deflated at 14 to 16 minutes and wound was closed with Prolene after irrigation.  Following this I then turned attention towards the dorsal aspect of the hand.  A incision was made about the ring finger metacarpal.  This was made proximally.  Dissection was carried down I performed retraction of the extensor apparatus dissected down to the fracture and then applied a 2.5 ALP S plate this was modified T plate which I sculpted.  AP lateral and oblique x-rays looked excellent.  This allowed ORIF and allowed fracture interdigitation in a perfect fashion.  Following this I then made a separate incision over the metacarpal head where it was displaced and unstable dissected down carefully manage the extensor apparatus and then performed ORIF with a 2.5 screw in the fracture.  The patient had a metacarpal head splitting fracture and a neck fracture noted.  The patient had this  area very carefully addressed with screw fixation.  I considered a plate but did not move forward with plate and screw fixation.  I felt that the area was too comminuted and tenuous  Nevertheless I was able to get good reduction and  fixation.  I then closed the periosteum with 4-0 Vicryl and closed the skin edge with Prolene.  AP lateral oblique x-rays were performed examined and interpreted by myself for the ORIF procedure of the fourth and third metacarpals left hand.  Patient looked rather well with excellent alignment interdigitation and stability.  She had soft compartments.  There were no complications.  She was dressed in a standard dressing with dorsal and volar slabs.  Going forward we will see her back in 2 weeks after her discharge and placed on a cast.  At 4 weeks begin aggressive range of motion to the areas.  She will need prolonged therapy in my estimation and she will likely have some degree of stiffness nevertheless ulnar aspect the injuries the first 4 weeks given their instability.  She will be admitted overnight for pain management and continue vancomycin.  All questions have been addressed.  Cieanna Stormes MD

## 2018-06-18 NOTE — Anesthesia Preprocedure Evaluation (Addendum)
Anesthesia Evaluation  Patient identified by MRN, date of birth, ID band Patient awake    Reviewed: Allergy & Precautions, NPO status , Patient's Chart, lab work & pertinent test results  Airway Mallampati: II  TM Distance: >3 FB     Dental   Pulmonary Current Smoker,    breath sounds clear to auscultation       Cardiovascular  Rhythm:Regular Rate:Normal  History noted CG   Neuro/Psych History noted CG    GI/Hepatic negative GI ROS, Neg liver ROS,   Endo/Other    Renal/GU negative Renal ROS     Musculoskeletal   Abdominal   Peds  Hematology   Anesthesia Other Findings   Reproductive/Obstetrics                            Anesthesia Physical Anesthesia Plan  ASA: III  Anesthesia Plan: General   Post-op Pain Management:    Induction: Intravenous  PONV Risk Score and Plan: Ondansetron and Midazolam  Airway Management Planned: Oral ETT  Additional Equipment:   Intra-op Plan:   Post-operative Plan: Extubation in OR  Informed Consent: I have reviewed the patients History and Physical, chart, labs and discussed the procedure including the risks, benefits and alternatives for the proposed anesthesia with the patient or authorized representative who has indicated his/her understanding and acceptance.     Dental advisory given  Plan Discussed with: CRNA and Anesthesiologist  Anesthesia Plan Comments:         Anesthesia Quick Evaluation

## 2018-06-18 NOTE — ED Provider Notes (Signed)
Care assumed from Army Melia, PA-C at shift change with consult to hand pending.   In brief, this patient is a 63 y.o. F who presents with left hand pain status post mechanical fall that occurred last night.  Please see note from previous provider for full history/physical exam.  PLAN: Patient with dislocation of the MCP as well as several years.  Previous provider attempted reduction 3 times with no improvement in alignment.  Plan to consult with hand.  MDM:  Discussed patient with Dr. Amanda Pea (hand).  He would like patient transferred to [id number] ED for hand evaluation.  Updated patient on plan.  She is agreeable.  Patient made n.p.o.  She will get a ride to take her over there.  Portions of this note were generated with Scientist, clinical (histocompatibility and immunogenetics). Dictation errors may occur despite best attempts at proofreading.    1. Dislocation, finger, metacarpophalangeal joint, initial encounter   2. Closed nondisplaced fracture of base of fourth metacarpal bone of left hand, initial encounter   3. Closed displaced fracture of shaft of third metacarpal bone of left hand, initial encounter   4. Closed fracture of distal end of left radius, unspecified fracture morphology, initial encounter       Maxwell Caul, PA-C 06/18/18 2050    Charlynne Pander, MD 06/18/18 2325

## 2018-06-18 NOTE — Discharge Instructions (Addendum)

## 2018-06-18 NOTE — ED Provider Notes (Signed)
Patient arrived from [facility] in stable condition. Dr. Amanda Pea of hand surgery notified of patient's arrival to ED. Hand surgery to evaluate, will keep NPO, anticipate O.R. tonight.    Ward, Chase Picket, PA-C 06/18/18 2052    Eber Hong, MD 06/20/18 [phone]

## 2018-06-18 NOTE — ED Notes (Signed)
ED Provider at bedside. 

## 2018-06-18 NOTE — ED Triage Notes (Signed)
L hand pain after falling. Swelling noted.

## 2018-06-19 DIAGNOSIS — S63261A Dislocation of metacarpophalangeal joint of left index finger, initial encounter: Secondary | ICD-10-CM | POA: Diagnosis not present

## 2018-06-19 LAB — BASIC METABOLIC PANEL
Anion gap: 11 (ref 5–15)
BUN: 5 mg/dL — ABNORMAL LOW (ref 6–20)
CO2: 21 mmol/L — ABNORMAL LOW (ref 22–32)
Calcium: 9.2 mg/dL (ref 8.9–10.3)
Chloride: 103 mmol/L (ref 98–111)
Creatinine, Ser: 0.64 mg/dL (ref 0.44–1.00)
GFR calc Af Amer: >60 mL/min (ref >60)
GFR calc non Af Amer: >60 mL/min (ref >60)
Glucose, Bld: 147 mg/dL — ABNORMAL HIGH (ref 70–99)
Potassium: 3.7 mmol/L (ref 3.5–5.1)
Sodium: 135 mmol/L (ref 135–145)

## 2018-06-19 MED ORDER — ONDANSETRON HCL 4 MG PO TABS: 4.0000 mg | ORAL_TABLET | Freq: Four times a day (QID) | ORAL | Status: DC | PRN

## 2018-06-19 MED ORDER — VANCOMYCIN HCL 1000 MG IV SOLR
1000.0000 mg | INTRAVENOUS | Status: DC
  Filled 2018-06-19: qty 1000

## 2018-06-19 MED ORDER — VITAMIN C 500 MG PO TABS
1000.0000 mg | ORAL_TABLET | Freq: Every day | ORAL | Status: DC
  Administered 2018-06-19: 1000 mg via ORAL
  Filled 2018-06-19: qty 2

## 2018-06-19 MED ORDER — OXYCODONE HCL 5 MG PO TABS: 10.0000 mg | ORAL_TABLET | ORAL | Status: DC | PRN

## 2018-06-19 MED ORDER — OXYCODONE HCL 10 MG PO TABS: 5.0000 mg | ORAL_TABLET | ORAL | 0 refills | Status: DC | PRN

## 2018-06-19 MED ORDER — DOXYCYCLINE HYCLATE 50 MG PO CAPS: 100.0000 mg | ORAL_CAPSULE | Freq: Two times a day (BID) | ORAL | 0 refills | Status: DC

## 2018-06-19 MED ORDER — ACETAMINOPHEN 325 MG PO TABS: 325.0000 mg | ORAL_TABLET | Freq: Four times a day (QID) | ORAL | Status: DC | PRN

## 2018-06-19 MED ORDER — DOCUSATE SODIUM 100 MG PO CAPS
100.0000 mg | ORAL_CAPSULE | Freq: Two times a day (BID) | ORAL | Status: DC
  Administered 2018-06-19 (×2): 100 mg via ORAL
  Filled 2018-06-19 (×2): qty 1

## 2018-06-19 MED ORDER — FAMOTIDINE 20 MG PO TABS: 20.0000 mg | ORAL_TABLET | Freq: Two times a day (BID) | ORAL | Status: DC | PRN

## 2018-06-19 MED ORDER — HYDROMORPHONE HCL 1 MG/ML IJ SOLN: 0.5000 mg | INTRAMUSCULAR | Status: DC | PRN

## 2018-06-19 MED ORDER — ACETAMINOPHEN 500 MG PO TABS
1000.0000 mg | ORAL_TABLET | Freq: Four times a day (QID) | ORAL | Status: DC
  Administered 2018-06-19 (×2): 1000 mg via ORAL
  Filled 2018-06-19 (×2): qty 2

## 2018-06-19 MED ORDER — OXYCODONE HCL 5 MG PO TABS
5.0000 mg | ORAL_TABLET | ORAL | Status: DC | PRN
  Administered 2018-06-19: 10 mg via ORAL
  Administered 2018-06-19: 5 mg via ORAL
  Filled 2018-06-19: qty 1
  Filled 2018-06-19: qty 2

## 2018-06-19 MED ORDER — PROMETHAZINE HCL 12.5 MG RE SUPP
12.5000 mg | Freq: Four times a day (QID) | RECTAL | Status: DC | PRN
  Filled 2018-06-19: qty 1

## 2018-06-19 MED ORDER — METHOCARBAMOL 500 MG PO TABS: 500.0000 mg | ORAL_TABLET | Freq: Four times a day (QID) | ORAL | 0 refills | Status: DC | PRN

## 2018-06-19 MED ORDER — ONDANSETRON HCL 4 MG/2ML IJ SOLN: 4.0000 mg | Freq: Four times a day (QID) | INTRAMUSCULAR | Status: DC | PRN

## 2018-06-19 NOTE — Anesthesia Postprocedure Evaluation (Signed)
Anesthesia Post Note  Patient: Shawna Smith  Procedure(s) Performed: OPEN REDUCTION INTERNAL FIXATION (ORIF) LEFT HAND, MUTIPLE LEFT METACARPAL FRACTURES, AND OPEN REDUCTION OF MCP JOINT OF LEFT INDEX FINGER (Left )     Patient location during evaluation: PACU Anesthesia Type: General Level of consciousness: awake Pain management: pain level controlled Vital Signs Assessment: post-procedure vital signs reviewed and stable Respiratory status: spontaneous breathing Cardiovascular status: stable Postop Assessment: no apparent nausea or vomiting Anesthetic complications: no    Last Vitals:  Vitals:   06/18/18 2325 06/18/18 2348  BP: (!) 179/91 (!) 195/108  Pulse: 94   Resp: 20   Temp:    SpO2: 95%     Last Pain:  Vitals:   06/18/18 2049  TempSrc: Oral  PainSc: 8                  Deakon Frix

## 2018-06-19 NOTE — Progress Notes (Signed)
Pharmacy Antibiotic Note  Shawna Smith is a 63 y.o. female admitted on 06/18/2018 with surgical prophylaxis.  Pharmacy has been consulted for vancomycin dosing. She received 1gm vanc earlier tonight  Plan: Cont vancomycin 1gm IV q24 hours F/u LOT  Height: 5\' 8"  (172.7 cm) Weight: 120 lb (54.4 kg) IBW/kg (Calculated) : 63.9  Temp (24hrs), Avg:98.2 F (36.8 C), Min:97.6 F (36.4 C), Max:98.8 F (37.1 C)  Recent Labs  Lab 06/19/18 0029  CREATININE 0.64    Estimated Creatinine Clearance: 64.2 mL/min (by C-G formula based on SCr of 0.64 mg/dL).    Allergies  Allergen Reactions  . Penicillins   . Sulfa Antibiotics      Thank you for allowing pharmacy to be a part of this patient's care.  Talbert Cage Poteet 06/19/2018 2:04 AM

## 2018-06-19 NOTE — Discharge Summary (Signed)
Physician Discharge Summary  Patient ID: Shawna Smith MRN: [id number] DOB/AGE: Nov 18, 1955 63 y.o.  Admit date: 06/18/2018 Discharge date:   Admission Diagnoses: multiple metacarpal fractures of left hand, dislocation of MCP joint of left index finger Past Medical History:  Diagnosis Date  . MS (multiple sclerosis) (HCC)   . Osteoporosis     Discharge Diagnoses:  Active Problems:   Hand fracture, left   Metacarpal bone fracture   Surgeries: Procedure(s): OPEN REDUCTION INTERNAL FIXATION (ORIF) LEFT HAND, MUTIPLE LEFT METACARPAL FRACTURES, AND OPEN REDUCTION OF MCP JOINT OF LEFT INDEX FINGER on 06/18/2018    Consultants:   Discharged Condition: Improved  Hospital Course: Shawna Smith is an 63 y.o. female who was admitted 06/18/2018 with a chief complaint of  Chief Complaint  Patient presents with  . Hand Injury  , and found to have a diagnosis of multiple metacarpal fractures of left hand, dislocation of MCP joint of left index finger.  They were brought to the operating room on 06/18/2018 and underwent Procedure(s): OPEN REDUCTION INTERNAL FIXATION (ORIF) LEFT HAND, MUTIPLE LEFT METACARPAL FRACTURES, AND OPEN REDUCTION OF MCP JOINT OF LEFT INDEX FINGER.    They were given perioperative antibiotics:  Anti-infectives (From admission, onward)   Start     Dose/Rate Route Frequency Ordered Stop   06/19/18 1600  vancomycin (VANCOCIN) 1,000 mg in sodium chloride 0.9 % 250 mL IVPB     1,000 mg 250 mL/hr over 60 Minutes Intravenous Every 24 hours 06/19/18 0204     06/19/18 0000  doxycycline (VIBRAMYCIN) 50 MG capsule     100 mg Oral 2 times daily 06/19/18 0818      .  They were given sequential compression devices, early ambulation, and Other (comment) for DVT prophylaxis.  Recent vital signs:  Patient Vitals for the past 24 hrs:  BP Temp Temp src Pulse Resp SpO2 Height Weight  06/19/18 0342 (!) 159/94 (!) 97.4 F (36.3 C) Oral 98 - 97 % - -  06/19/18 0054 (!) 157/80 98.2 F  (36.8 C) Oral 99 - 100 % - -  06/19/18 0031 (!) 156/76 97.6 F (36.4 C) - 92 20 98 % - -  06/19/18 0021 (!) 187/92 - - 92 (!) 22 98 % - -  06/19/18 0006 (!) 191/100 - - (!) 108 17 100 % - -  06/18/18 2354 (!) 204/97 - - 97 (!) 21 98 % - -  06/18/18 2348 (!) 195/108 - - - - - - -  06/18/18 2344 (!) 195/108 - - (!) 101 (!) 21 97 % - -  06/18/18 2336 (!) 194/104 - - 100 (!) 25 94 % - -  06/18/18 2325 (!) 179/91 - - 94 20 95 % - -  06/18/18 2322 - - - 95 (!) 23 98 % - -  06/18/18 2321 (!) 186/101 98.2 F (36.8 C) - 96 18 98 % - -  06/18/18 2049 (!) 190/112 98.2 F (36.8 C) Oral 91 16 97 % - -  06/18/18 1958 (!) 191/86 98.8 F (37.1 C) - 74 16 98 % - -  06/18/18 1756 (!) 201/84 - - 69 16 95 % - -  06/18/18 1544 (!) 202/97 98.3 F (36.8 C) Oral 82 18 100 % - -  06/18/18 1541 - - - - - -  (1.727 m) 54.4 kg  .  Recent laboratory studies: Dg Hand Complete Left  Result Date: 06/18/2018 CLINICAL DATA:  63 year old female status post fall with left hand  swelling and deformity. EXAM: LEFT HAND - COMPLETE 3+ VIEW COMPARISON:  Left wrist 11/10/2016. FINDINGS: Two thousand eighteen nondisplaced distal left radius fracture healing is felt to explain the irregular sclerosis in the distal left radius metadiaphysis since that time. The distal left radius and ulna now appear intact. Carpal bone alignment stable and within normal limits. There is a comminuted oblique fracture of the 4th metacarpal proximal metadiaphysis with volar angulation. The appearance is suspicious for nondisplaced extension to the 4th [facility] joint. Oblique comminuted fracture of the left 3rd metacarpal distal metadiaphysis, extra-articular. Mild ulnar displacement. Dorsal and ulnar dislocation of the left 2nd MCP joint, although a small round ossific fragment there is a small chronic sesamoid rather than an acute fracture fragment. The 1st and 5th metacarpals are stable and intact. No phalanx fracture identified. IMPRESSION: 1.  Dislocation of the 2nd MCP joint without acute fracture identified there. 2. Comminuted and mildly displaced and angulated fractures of the distal 3rd metacarpal (extra-articular) and proximal 4th metacarpal (probably intra-articular). 3. Remote distal left radius fracture. Electronically Signed   By: Odessa Fleming M.D.   On: 06/18/2018 16:31   Dg Finger Index Left  Result Date: 06/18/2018 CLINICAL DATA:  Post reduction film. EXAM: LEFT INDEX FINGER 2+V COMPARISON:  Earlier same day FINDINGS: A single view of the index finger shows persistent dislocation of the proximal phalanx relative to the second metacarpal head. Position appear stem learn to the pre reduction film. Fractures in the distal third metacarpal again noted in the proximal fourth metacarpal fracture not well demonstrated on this film. IMPRESSION: Persistent second MCP joint dislocation. Electronically Signed   By: Kennith Center M.D.   On: 06/18/2018 18:18   Dg Finger Index Left  Result Date: 06/18/2018 CLINICAL DATA:  Post reduction films. EXAM: LEFT INDEX FINGER 2+V COMPARISON:  June 18, 2018 hand films. FINDINGS: There is continued dislocation of the index finger at the MCP joint. Fractures of the third and fourth metacarpals remain. IMPRESSION: Continued dislocation of the index finger at the MCP joint. Fractures of the third and fourth metacarpals remain. Electronically Signed   By: Gerome Sam III M.D   On: 06/18/2018 17:18    Discharge Medications:   Allergies as of 06/19/2018      Reactions   Penicillins    Sulfa Antibiotics       Medication List    TAKE these medications   doxycycline 50 MG capsule Commonly known as:  VIBRAMYCIN Take 2 capsules (100 mg total) by mouth 2 (two) times daily.   methocarbamol 500 MG tablet Commonly known as:  ROBAXIN Take 1 tablet (500 mg total) by mouth every 6 (six) hours as needed for muscle spasms.   Oxycodone HCl 10 MG Tabs Take 0.5 tablets (5 mg total) by mouth every 4 (four) hours  as needed for severe pain (pain score 7-10).       Diagnostic Studies: Dg Hand Complete Left  Result Date: 06/18/2018 CLINICAL DATA:  63 year old female status post fall with left hand swelling and deformity. EXAM: LEFT HAND - COMPLETE 3+ VIEW COMPARISON:  Left wrist 11/10/2016. FINDINGS: Two thousand eighteen nondisplaced distal left radius fracture healing is felt to explain the irregular sclerosis in the distal left radius metadiaphysis since that time. The distal left radius and ulna now appear intact. Carpal bone alignment stable and within normal limits. There is a comminuted oblique fracture of the 4th metacarpal proximal metadiaphysis with volar angulation. The appearance is suspicious for nondisplaced extension to the  4th [facility] joint. Oblique comminuted fracture of the left 3rd metacarpal distal metadiaphysis, extra-articular. Mild ulnar displacement. Dorsal and ulnar dislocation of the left 2nd MCP joint, although a small round ossific fragment there is a small chronic sesamoid rather than an acute fracture fragment. The 1st and 5th metacarpals are stable and intact. No phalanx fracture identified. IMPRESSION: 1. Dislocation of the 2nd MCP joint without acute fracture identified there. 2. Comminuted and mildly displaced and angulated fractures of the distal 3rd metacarpal (extra-articular) and proximal 4th metacarpal (probably intra-articular). 3. Remote distal left radius fracture. Electronically Signed   By: Odessa Fleming M.D.   On: 06/18/2018 16:31   Dg Finger Index Left  Result Date: 06/18/2018 CLINICAL DATA:  Post reduction film. EXAM: LEFT INDEX FINGER 2+V COMPARISON:  Earlier same day FINDINGS: A single view of the index finger shows persistent dislocation of the proximal phalanx relative to the second metacarpal head. Position appear stem learn to the pre reduction film. Fractures in the distal third metacarpal again noted in the proximal fourth metacarpal fracture not well demonstrated on this  film. IMPRESSION: Persistent second MCP joint dislocation. Electronically Signed   By: Kennith Center M.D.   On: 06/18/2018 18:18   Dg Finger Index Left  Result Date: 06/18/2018 CLINICAL DATA:  Post reduction films. EXAM: LEFT INDEX FINGER 2+V COMPARISON:  June 18, 2018 hand films. FINDINGS: There is continued dislocation of the index finger at the MCP joint. Fractures of the third and fourth metacarpals remain. IMPRESSION: Continued dislocation of the index finger at the MCP joint. Fractures of the third and fourth metacarpals remain. Electronically Signed   By: Gerome Sam III M.D   On: 06/18/2018 17:18    They benefited maximally from their hospital stay and there were no complications.     Disposition: Discharge disposition: 01-Home or Self Care      Discharge Instructions    Call MD / Call 911   Complete by:  As directed    If you experience chest pain or shortness of breath, CALL 911 and be transported to the hospital emergency room.  If you develope a fever above 101 F, pus (white drainage) or increased drainage or redness at the wound, or calf pain, call your surgeon's office.   Constipation Prevention   Complete by:  As directed    Drink plenty of fluids.  Prune juice may be helpful.  You may use a stool softener, such as Colace (over the counter) 100 mg twice a day.  Use MiraLax (over the counter) for constipation as needed.   Diet - low sodium heart healthy   Complete by:  As directed    Increase activity slowly as tolerated   Complete by:  As directed      Follow-up Information    Dominica Severin, MD. Schedule an appointment as soon as possible for a visit in 14 day(s).   Specialty:  Orthopedic Surgery Why:  We will call to see you in 2 weeks Contact information: [address] [place] [address] [phone]        [facility].   Contact information: [address] [place] [address] [phone]          Patient looks very well postoperatively.  We will allow her discharge today.  She will be discharged home on oxycodone, Robaxin, doxycycline.  She will notify me should any problems occur.  We will see her back in 2 weeks and will call  her for her appointment.  She is awake alert and oriented has had no complicating features and should do well.  This will likely be a long road given the significant multi-layered level of traumatic injury to the left hand that we would do everything to try to give her the best upper extremity possible.  All questions have been addressed.  At the time of discharge no evidence of UTI, DVT or other complication.  Final discharge diagnosis status post left MCP dislocation, third and fourth metacarpal fracture status post ORIF    Signed: Oletta Cohn III 06/19/2018, 8:19 AM

## 2018-06-19 NOTE — Evaluation (Signed)
Occupational Therapy Evaluation Patient Details Name: Shawna Smith MRN: [id number] DOB: [birth year] Today's Date: 06/19/2018    History of Present Illness Pt is a 63 y/o female presenting with a reducible left index MCP dislocation, third and fourth metacarpal fractures displaced after a fall. S/P ORIF index finger, reconstruction of volar plate, ORIF digits 3 and 4.   PMH: MS, osteoporosis.    Clinical Impression   PTA patient using cane for mobility and has aide who assists her with ADLs as needed, IADLs 6 days/week.  She reports she lives with her son, who can provide 24/7 support.  Patient admitted for above and limited by problem list below, including NWB/decreased functional use of L UE, impaired balance, decreased activity tolerance, poor memory and attention to task.  Able to complete transfers with min guard assist using cane, UB ADLs with min assist, and LB ADLs with mod-max assist. Patient is at an increased risk for falls due to history and decreased functional use of L UE, therefore recommend 24/7 assistance.  Pt agreeable to recommendations, verbalizes understanding of exercises, ice and elevation.  Patient reports aide assists mon-Saturday.  Patient will benefit from continued OT services while admitted to maximize safety and independence prior to dc home, will follow up with OT services when deemed appropriate by surgeon.     Follow Up Recommendations  Supervision/Assistance - 24 hour;Follow surgeon's recommendation for DC plan and follow-up therapies    Equipment Recommendations  None recommended by OT    Recommendations for Other Services       Precautions / Restrictions Precautions Precautions: Other (comment) Precaution Comments: post op splint to L UE  Restrictions Weight Bearing Restrictions: Yes LUE Weight Bearing: Non weight bearing      Mobility Bed Mobility Overal bed mobility: Needs Assistance Bed Mobility: Supine to Sit     Supine to sit: Supervision      General bed mobility comments: increased time and effort, cueing for safety and attention to task   Transfers Overall transfer level: Needs assistance Equipment used: Straight cane Transfers: Sit to/from Stand Sit to Stand: Min guard         General transfer comment: min guard for safety, cueing for technique due to L UE precautions    Balance Overall balance assessment: Needs assistance Sitting-balance support: No upper extremity supported;Feet supported Sitting balance-Leahy Scale: Good     Standing balance support: Single extremity supported;During functional activity Standing balance-Leahy Scale: Fair Standing balance comment: relaint on 1 UE support                           ADL either performed or assessed with clinical judgement   ADL Overall ADL's : Needs assistance/impaired     Grooming: Set up;Sitting   Upper Body Bathing: Minimal assistance;Sitting   Lower Body Bathing: Moderate assistance;Sit to/from stand   Upper Body Dressing : Minimal assistance;Sitting   Lower Body Dressing: Moderate assistance;Sit to/from stand Lower Body Dressing Details (indicate cue type and reason): total assist to don socks, sit to stand with min guard   Toilet Transfer: Min guard;Ambulation Toilet Transfer Details (indicate cue type and reason): simulated to recliner, using cane Toileting- Clothing Manipulation and Hygiene: Min guard;Sit to/from stand       Functional mobility during ADLs: Min guard;Cane General ADL Comments: pt educated on precautions for R UE, education provided on compensatory dressing techniques and safety recommendations      Vision   Vision Assessment?: No  apparent visual deficits     Perception     Praxis      Pertinent Vitals/Pain Pain Assessment: Faces Faces Pain Scale: Hurts little more Pain Location: L UE  Pain Descriptors / Indicators: Discomfort;Grimacing;Guarding Pain Intervention(s): Limited activity within patient's  tolerance;Repositioned;Monitored during session     Hand Dominance Right   Extremity/Trunk Assessment Upper Extremity Assessment Upper Extremity Assessment: LUE deficits/detail LUE Deficits / Details: shoulder/elbow WFL, post op splint to forearm/hand LUE: Unable to fully assess due to immobilization LUE Coordination: decreased fine motor   Lower Extremity Assessment Lower Extremity Assessment: Overall WFL for tasks assessed       Communication Communication Communication: No difficulties   Cognition Arousal/Alertness: Awake/alert Behavior During Therapy: WFL for tasks assessed/performed Overall Cognitive Status: History of cognitive impairments - at baseline                                 General Comments: pt with hx of MS- presents with decreased STM, poor attention to task     General Comments       Exercises Exercises: Other exercises Other Exercises Other Exercises: reviewed elevation, ice and exercises; completed 10 reps 1 set elbow flex/ext and shoulder flex/ext; agreeable to complete 3x/day    Shoulder Instructions      Home Living Family/patient expects to be discharged to:: Private residence Living Arrangements: Children(son) Available Help at Discharge: Family;Available 24 hours/day Type of Home: Apartment Home Access: Stairs to enter Entrance Stairs-Number of Steps: 1+1   Home Layout: One level     Bathroom Shower/Tub: Chief Strategy Officer: Standard     Home Equipment: Environmental consultant - 2 wheels;Cane - single point;Crutches;Bedside commode;Shower seat          Prior Functioning/Environment Level of Independence: Independent with assistive device(s)        Comments: using cane for mobility, aide assist with ADLs, IADLs, med mgmt (6 days/week)         OT Problem List: Decreased activity tolerance;Impaired balance (sitting and/or standing);Decreased coordination;Decreased cognition;Decreased safety awareness;Decreased  knowledge of use of DME or AE;Decreased knowledge of precautions;Impaired UE functional use;Pain      OT Treatment/Interventions: Self-care/ADL training;Therapeutic exercise;DME and/or AE instruction;Cognitive remediation/compensation;Therapeutic activities;Balance training;Patient/family education    OT Goals(Current goals can be found in the care plan section) Acute Rehab OT Goals Patient Stated Goal: home today OT Goal Formulation: With patient Time For Goal Achievement: 07/03/18 Potential to Achieve Goals: Good  OT Frequency: Min 3X/week   Barriers to D/C:            Co-evaluation              AM-PAC OT "6 Clicks" Daily Activity     Outcome Measure Help from another person eating meals?: A Little Help from another person taking care of personal grooming?: A Little Help from another person toileting, which includes using toliet, bedpan, or urinal?: A Little Help from another person bathing (including washing, rinsing, drying)?: A Lot Help from another person to put on and taking off regular upper body clothing?: A Little Help from another person to put on and taking off regular lower body clothing?: A Lot 6 Click Score: 16   End of Session Equipment Utilized During Treatment: Other (comment)(cane) Nurse Communication: Mobility status  Activity Tolerance: Patient tolerated treatment well Patient left: in chair;with call bell/phone within reach  OT Visit Diagnosis: Unsteadiness on feet (R26.81);Pain Pain - Right/Left:  Left Pain - part of body: Arm;Hand                Time: [id number] OT Time Calculation (min): 28 min Charges:  OT General Charges $OT Visit: 1 Visit OT Evaluation $OT Eval Low Complexity: 1 Low OT Treatments $Self Care/Home Management : 8-22 mins  Chancy Milroyhristie S Camerin Ladouceur, OT Acute Rehabilitation Services Pager [phone] Office [phone]   Chancy MilroyChristie S Lorana Maffeo 06/19/2018, 8:51 AM

## 2018-06-19 NOTE — Progress Notes (Signed)
FLORAINE RAYBOULD is a 63 y.o. female patient admitted from PACU awake, alert - oriented  X 4 - no acute distress noted.  VSS - Blood pressure (!) 157/80, pulse 99, temperature 98.2 F (36.8 C), temperature source Oral, resp. rate 20, height 5\' 8"  (1.727 m), weight 54.4 kg, SpO2 100 %.    IV in place, occlusive dsg intact without redness.    Will cont to eval and treat per MD orders.  Rolland Porter, RN 06/19/2018 1:29 AM

## 2018-06-19 NOTE — Care Management CC44 (Signed)
Condition Code 44 Documentation Completed  Patient Details  Name: Shawna Smith MRN: [id number] Date of Birth: May 09, 1955   Condition Code 44 given:  Yes Patient signature on Condition Code 44 notice:  Yes Documentation of 2 MD's agreement:  Yes Code 44 added to claim:  Yes    Deveron Furlong, RN 06/19/2018, 8:57 AM

## 2018-06-19 NOTE — Progress Notes (Signed)
Pt called back to floor, the pharmacy is closed today.  I phoned after hour physician's office advised to have MD send scripts to updated [facility] in [facility] per the Pt's request.

## 2018-06-19 NOTE — Progress Notes (Signed)
Pt discharged home with daughter, taken to front of the hospital at 10:30 AM

## 2018-06-20 ENCOUNTER — Encounter (HOSPITAL_COMMUNITY): Payer: Self-pay | Admitting: Orthopedic Surgery

## 2018-06-20 MED FILL — DOXYCYCLINE HYC 50 MG CAP: 50 | 14 days supply | Qty: 56 | Fill #0

## 2018-06-20 MED FILL — METHOCARBAMOL 500 MG TABLET: 500 | 7 days supply | Qty: 30 | Fill #0

## 2018-06-20 MED FILL — oxyCODONE HCL 10 MG TABS: 10 | 13 days supply | Qty: 40 | Fill #0

## 2019-11-01 ENCOUNTER — Other Ambulatory Visit: Payer: Self-pay

## 2019-11-01 ENCOUNTER — Emergency Department (HOSPITAL_BASED_OUTPATIENT_CLINIC_OR_DEPARTMENT_OTHER)
Admission: EM | Admit: 2019-11-01 | Discharge: 2019-11-01 | Disposition: A | Discharge status: Home / Self Care | Payer: Medicare HMO | Attending: Emergency Medicine | Admitting: Emergency Medicine

## 2019-11-01 ENCOUNTER — Emergency Department (HOSPITAL_BASED_OUTPATIENT_CLINIC_OR_DEPARTMENT_OTHER): Payer: Medicare HMO

## 2019-11-01 ENCOUNTER — Encounter (HOSPITAL_BASED_OUTPATIENT_CLINIC_OR_DEPARTMENT_OTHER): Payer: Self-pay | Admitting: *Deleted

## 2019-11-01 DIAGNOSIS — R3589 Other polyuria: Secondary | ICD-10-CM

## 2019-11-01 DIAGNOSIS — W19XXXA Unspecified fall, initial encounter: Secondary | ICD-10-CM

## 2019-11-01 DIAGNOSIS — F17210 Nicotine dependence, cigarettes, uncomplicated: Secondary | ICD-10-CM | POA: Insufficient documentation

## 2019-11-01 DIAGNOSIS — G35 Multiple sclerosis: Secondary | ICD-10-CM | POA: Diagnosis not present

## 2019-11-01 DIAGNOSIS — R350 Frequency of micturition: Secondary | ICD-10-CM | POA: Diagnosis present

## 2019-11-01 DIAGNOSIS — R358 Other polyuria: Secondary | ICD-10-CM | POA: Insufficient documentation

## 2019-11-01 LAB — URINALYSIS, MICROSCOPIC (REFLEX)

## 2019-11-01 LAB — URINALYSIS, ROUTINE W REFLEX MICROSCOPIC
Bilirubin Urine: NEGATIVE
Glucose, UA: NEGATIVE mg/dL
Ketones, ur: NEGATIVE mg/dL
Leukocytes,Ua: NEGATIVE
Nitrite: NEGATIVE
Protein, ur: >300 mg/dL — AB
Specific Gravity, Urine: 1.025 (ref 1.005–1.030)
pH: 6 (ref 5.0–8.0)

## 2019-11-01 LAB — CBG MONITORING, ED: Glucose-Capillary: 101 mg/dL — ABNORMAL HIGH (ref 70–99)

## 2019-11-01 NOTE — ED Provider Notes (Signed)
[facility] EMERGENCY DEPARTMENT Provider Note   CSN: [id number] Arrival date & time: 11/01/19  1511     History Chief Complaint  Patient presents with   Urinary Frequency    Shawna Smith is a 64 y.o. female with past medical history of multiple sclerosis, osteoporosis, presenting to the emergency department with complaint of about 2 to 3 weeks of urinary symptoms.  She states she has urinary urgency and frequency. She feels as if she needs to urinate frequently though only has some dribbles of urine. She denies associated dysuria, abdominal pain, flank pain, nausea, vomiting, fevers.  She states she has history of UTI though this was many years ago when she was pregnant.  No interventions tried for symptoms.  The history is provided by the patient.       Past Medical History:  Diagnosis Date   MS (multiple sclerosis) (HCC)    Osteoporosis     Patient Active Problem List   Diagnosis Date Noted   Hand fracture, left 06/18/2018   Metacarpal bone fracture 06/18/2018    Past Surgical History:  Procedure Laterality Date   OPEN REDUCTION INTERNAL FIXATION (ORIF) METACARPAL Left 06/18/2018   Procedure: OPEN REDUCTION INTERNAL FIXATION (ORIF) LEFT HAND, MUTIPLE LEFT METACARPAL FRACTURES, AND OPEN REDUCTION OF MCP JOINT OF LEFT INDEX FINGER;  Surgeon: Dominica Severin, MD;  Location: [facility] OR;  Service: Orthopedics;  Laterality: Left;     OB History   No obstetric history on file.     No family history on file.  Social History   Tobacco Use   Smoking status: Current Every Day Smoker    Packs/day: 0.50    Types: Cigarettes   Smokeless tobacco: Never Used  Substance Use Topics   Alcohol use: Never   Drug use: Never    Home Medications Prior to Admission medications   Medication Sig Start Date End Date Taking? Authorizing Provider  doxycycline (VIBRAMYCIN) 50 MG capsule Take 2 capsules (100 mg total) by mouth 2 (two) times daily. 06/19/18   Dominica Severin,  MD  methocarbamol (ROBAXIN) 500 MG tablet Take 1 tablet (500 mg total) by mouth every 6 (six) hours as needed for muscle spasms. 06/19/18   Dominica Severin, MD  oxyCODONE 10 MG TABS Take 0.5 tablets (5 mg total) by mouth every 4 (four) hours as needed for severe pain (pain score 7-10). 06/19/18   Dominica Severin, MD    Allergies    Penicillins and Sulfa antibiotics  Review of Systems   Review of Systems  Constitutional: Negative for fever.  Gastrointestinal: Negative for abdominal pain.  Genitourinary: Positive for frequency and urgency. Negative for dysuria and flank pain.  All other systems reviewed and are negative.   Physical Exam Updated Vital Signs BP (!) 197/100 (BP Location: Right Arm) Comment: pt states not taking bp meds today   Pulse 89    Temp 98.4 F (36.9 C) (Oral)    Resp 17    Ht 5\' 3"  (1.6 m)    Wt 45.4 kg    SpO2 95%    BMI 17.71 kg/m   Physical Exam Vitals and nursing note reviewed.  Constitutional:      General: She is not in acute distress.    Appearance: She is well-developed. She is not ill-appearing.  HENT:     Head: Normocephalic and atraumatic.  Eyes:     Conjunctiva/sclera: Conjunctivae normal.  Cardiovascular:     Rate and Rhythm: Normal rate and regular rhythm.  Pulmonary:  Effort: Pulmonary effort is normal. No respiratory distress.     Breath sounds: Normal breath sounds.  Abdominal:     General: Bowel sounds are normal.     Palpations: Abdomen is soft.     Tenderness: There is no abdominal tenderness. There is no right CVA tenderness, left CVA tenderness, guarding or rebound.  Skin:    General: Skin is warm.  Neurological:     Mental Status: She is alert.  Psychiatric:        Behavior: Behavior normal.     ED Results / Procedures / Treatments   Labs (all labs ordered are listed, but only abnormal results are displayed) Labs Reviewed  URINE CULTURE  URINALYSIS, ROUTINE W REFLEX MICROSCOPIC    EKG None  Radiology No results  found.  Procedures Procedures (including critical care time)  Medications Ordered in ED Medications - No data to display  ED Course  I have reviewed the triage vital signs and the nursing notes.  Pertinent labs & imaging results that were available during my care of the patient were reviewed by me and considered in my medical decision making (see chart for details).    MDM Rules/Calculators/A&P                          Patient presenting with about 2-3 weeks of urinary urgency and frequency. Pt is afebrile, no CVA tenderness, normotensive, and denies N/V. She is well-appearing and in no distress. Patient missed the urine specimen cup on first attempt, therefore delay in obtaining urine.  Care handed off at shift change to PA Henderly, pending UA results and post void residual.  If UTI is present, recommend treat with Macrobid for uncomplicated UTI.  Patient has sulfa and penicillin allergies. Outpt follow up.   Final Clinical Impression(s) / ED Diagnoses Final diagnoses:  None    Rx / DC Orders ED Discharge Orders    None       Rizwan Kuyper, [place] N, PA-C 11/01/19 1837    Terrilee Files, MD 11/02/19 1056

## 2019-11-01 NOTE — ED Notes (Signed)
Unable to urinate at this time-missed cup on 1st attempt.   Pt states inability to empty bladder and increased frequency. No burning/discharge/bleeding per pt.

## 2019-11-01 NOTE — ED Triage Notes (Signed)
Co urinary symptoms x 3 weeks

## 2019-11-01 NOTE — ED Notes (Signed)
ED Provider at bedside. 

## 2019-11-01 NOTE — ED Provider Notes (Signed)
64 old presenting for evaluation urinary frequency x3 weeks. Care transferred from [place], [place]. See note for full HPI.  No polydyspnea. No history of diabetes.  No hematuria, discharge, pelvic pain, abdominal pain, burning with urination, or vomiting  Plan on follow-up on UA and postvoid residual.  Macrobid if UTI  If negative for UTI follow up with PCP. Does not need labs per previous provider  Physical Exam  BP (!) 185/106   Pulse 85   Temp 98.4 F (36.9 C) (Oral)   Resp 16   Ht 5\' 3"  (1.6 m)   Wt 45.4 kg   SpO2 96%   BMI 17.71 kg/m   Physical Exam Vitals and nursing note reviewed.  Constitutional:      General: She is not in acute distress.    Appearance: She is well-developed. She is not ill-appearing, toxic-appearing or diaphoretic.  HENT:     Head: Normocephalic and atraumatic.     Comments: No battle sign, raccoon eyes.  No hemotympanum    Nose: Nose normal.     Mouth/Throat:     Mouth: Mucous membranes are moist.  Eyes:     Pupils: Pupils are equal, round, and reactive to light.  Cardiovascular:     Rate and Rhythm: Normal rate.     Pulses: Normal pulses.     Heart sounds: Normal heart sounds.  Pulmonary:     Effort: Pulmonary effort is normal. No respiratory distress.     Breath sounds: Normal breath sounds.  Abdominal:     General: Bowel sounds are normal. There is no distension.     Tenderness: There is no abdominal tenderness. There is no right CVA tenderness, left CVA tenderness or guarding.  Musculoskeletal:        General: No swelling, tenderness or deformity. Normal range of motion.     Cervical back: Normal range of motion.     Right lower leg: No edema.     Comments: Full range of motion bilateral lower extremities at difficulty.  Mild tenderness to right anterior hip.  Patient ambulatory with cane at baseline.  No shortening or rotation of legs.  Pelvis stable, nontender palpation.  No midline spinal tenderness.  Skin:    General: Skin is warm  and dry.     Capillary Refill: Capillary refill takes less than 2 seconds.  Neurological:     General: No focal deficit present.     Mental Status: She is alert and oriented to person, place, and time.    ED Course/Procedures     Procedures Labs Reviewed  URINALYSIS, ROUTINE W REFLEX MICROSCOPIC - Abnormal; Notable for the following components:      Result Value   Hgb urine dipstick TRACE (*)    Protein, ur >300 (*)    All other components within normal limits  URINALYSIS, MICROSCOPIC (REFLEX) - Abnormal; Notable for the following components:   Bacteria, UA FEW (*)    All other components within normal limits  CBG MONITORING, ED - Abnormal; Notable for the following components:   Glucose-Capillary 101 (*)    All other components within normal limits  URINE CULTURE  CT Head Wo Contrast  Result Date: 11/01/2019 CLINICAL DATA:  Status post trauma. EXAM: CT HEAD WITHOUT CONTRAST TECHNIQUE: Contiguous axial images were obtained from the base of the skull through the vertex without intravenous contrast. COMPARISON:  May 31, 2019 FINDINGS: Brain: There is mild cerebral atrophy with widening of the extra-axial spaces and ventricular dilatation. There are areas of  decreased attenuation within the white matter tracts of the supratentorial brain, consistent with microvascular disease changes. A small, stable area of cortical encephalomalacia, with adjacent white matter low attenuation, is seen within the parasagittal portion of the posterior parietal region on the left. Vascular: No hyperdense vessel or unexpected calcification. Skull: Normal. Negative for fracture or focal lesion. Sinuses/Orbits: No acute finding. Other: None. IMPRESSION: 1. Generalized cerebral atrophy. 2. Small, stable area of cortical encephalomalacia, with adjacent white matter low attenuation, within the parasagittal portion of the posterior parietal region on the left. 3. No acute intracranial abnormality. Electronically  Signed   By: Aram Candela M.D.   On: 11/01/2019 19:58   CT Cervical Spine Wo Contrast  Result Date: 11/01/2019 CLINICAL DATA:  Status post trauma. EXAM: CT CERVICAL SPINE WITHOUT CONTRAST TECHNIQUE: Multidetector CT imaging of the cervical spine was performed without intravenous contrast. Multiplanar CT image reconstructions were also generated. COMPARISON:  None. FINDINGS: Alignment: Normal. Skull base and vertebrae: No acute fracture. No primary bone lesion or focal pathologic process. Soft tissues and spinal canal: No prevertebral fluid or swelling. No visible canal hematoma. Disc levels: Moderate to marked severity endplate sclerosis and anterior osteophyte formation is seen at the levels of C3-C4, C4-C5, C5-C6 and C6-C7. Moderate severity intervertebral disc space narrowing is also seen at these levels. Mild, bilateral multilevel facet joint hypertrophy is noted. Upper chest: Negative. Other: None. IMPRESSION: 1. No acute fracture or subluxation. 2. Moderate to marked severity degenerative changes at the levels of C3-C4, C4-C5, C5-C6 and C6-C7. Electronically Signed   By: Aram Candela M.D.   On: 11/01/2019 20:06   DG Hip Unilat W or Wo Pelvis 2-3 Views Right  Result Date: 11/01/2019 CLINICAL DATA:  Fall with right-sided hip pain EXAM: DG HIP (WITH OR WITHOUT PELVIS) 2-3V RIGHT COMPARISON:  None. FINDINGS: There is no evidence of hip fracture or dislocation. There is no evidence of arthropathy or other focal bone abnormality. IMPRESSION: Negative. CT follow-up may be performed if continued clinical suspicion for fracture. Electronically Signed   By: Jasmine Pang M.D.   On: 11/01/2019 19:45   MDM  Not following up on UA per previous provider.  UA negative for infection.  She does have proteinuria.  No evidence of infectious process will send for culture  Post void residual <4 ml on bladder scan, low suspicion for retention  CBG 101  Patient reassessed. Soft, nontender.  Negative CVA  tap bilaterally.  Patient tolerating p.o. intake without difficulty.  She denies any pelvic pain, vaginal discharge.  She is hypertensive.  Has not taken her blood pressure medication in 2 days.  I offered to give this to her here however she declined.  Discharge patient and she states she was actually here for a fall this morning had a mechanical fall. Hit the posterior aspect of her head. States she has a headache and some posterior neck pain as well as right hip pain. She is been Press photographer but difficulty. Patient with nonfocal neuro exam without deficits.  Imaging without acute findings.  She does have chronic encephalomalacia.  I discussed with patient we did not find a cure for her polyuria.  Will refer to urology whom she states she has seen previously.  Low suspicion for acute infectious process.  Patient ambulatory at her baseline.  The patient has been appropriately medically screened and/or stabilized in the ED. I have low suspicion for any other emergent medical condition which would require further screening, evaluation or treatment in the  ED or require inpatient management.  Patient is hemodynamically stable and in no acute distress.  Patient able to ambulate in department prior to ED.  Evaluation does not show acute pathology that would require ongoing or additional emergent interventions while in the emergency department or further inpatient treatment.  I have discussed the diagnosis with the patient and answered all questions.  Pain is been managed while in the emergency department and patient has no further complaints prior to discharge.  Patient is comfortable with plan discussed in room and is stable for discharge at this time.  I have discussed strict return precautions for returning to the emergency department.  Patient was encouraged to follow-up with PCP/specialist refer to at discharge.      Daniele Yankowski A, PA-C 11/01/19 2053    Terrilee Files, MD 11/02/19 1056

## 2019-11-01 NOTE — ED Notes (Signed)
Post void residual measured twice, >28mL & less than >4 mL

## 2019-11-01 NOTE — Discharge Instructions (Signed)
Call [facility] to schedule an appointment  Return for new or worsening symptoms  Take your blood pressure medication when you get home

## 2019-11-02 LAB — URINE CULTURE: Culture: <10000 — AB

## 2020-05-27 ENCOUNTER — Emergency Department (HOSPITAL_COMMUNITY)
Admission: EM | Admit: 2020-05-27 | Discharge: 2020-05-27 | Disposition: A | Discharge status: Home / Self Care | Payer: Medicare HMO | Source: Home / Self Care

## 2020-05-27 ENCOUNTER — Encounter (HOSPITAL_COMMUNITY): Payer: Self-pay | Admitting: Emergency Medicine

## 2020-05-27 DIAGNOSIS — Z5321 Procedure and treatment not carried out due to patient leaving prior to being seen by health care provider: Secondary | ICD-10-CM | POA: Insufficient documentation

## 2020-05-27 DIAGNOSIS — J189 Pneumonia, unspecified organism: Secondary | ICD-10-CM | POA: Diagnosis not present

## 2020-05-27 DIAGNOSIS — R531 Weakness: Secondary | ICD-10-CM | POA: Insufficient documentation

## 2020-05-27 LAB — CBC
HCT: 40.7 % (ref 36.0–46.0)
Hemoglobin: 14.7 g/dL (ref 12.0–15.0)
MCH: 38.3 pg — ABNORMAL HIGH (ref 26.0–34.0)
MCHC: 36.1 g/dL — ABNORMAL HIGH (ref 30.0–36.0)
MCV: 106 fL — ABNORMAL HIGH (ref 80.0–100.0)
Platelets: 211 10*3/uL (ref 150–400)
RBC: 3.84 MIL/uL — ABNORMAL LOW (ref 3.87–5.11)
RDW: 14.9 % (ref 11.5–15.5)
WBC: 4.7 10*3/uL (ref 4.0–10.5)
nRBC: 0 % (ref 0.0–0.2)

## 2020-05-27 LAB — BASIC METABOLIC PANEL
Anion gap: 10 (ref 5–15)
BUN: 7 mg/dL — ABNORMAL LOW (ref 8–23)
CO2: 30 mmol/L (ref 22–32)
Calcium: 9 mg/dL (ref 8.9–10.3)
Chloride: 99 mmol/L (ref 98–111)
Creatinine, Ser: 0.72 mg/dL (ref 0.44–1.00)
GFR, Estimated: >60 mL/min (ref >60)
Glucose, Bld: 82 mg/dL (ref 70–99)
Potassium: 2.6 mmol/L — CL (ref 3.5–5.1)
Sodium: 139 mmol/L (ref 135–145)

## 2020-05-27 NOTE — ED Notes (Signed)
Pt sister stated she was here at 9am and the same people are here. Asked for e to remove the IV so that she can take her home.

## 2020-05-27 NOTE — ED Triage Notes (Signed)
20 g in Lt. AC given 400 cc N/S

## 2020-05-27 NOTE — ED Notes (Signed)
sister stated, to nurse tech she wants her IV out so we can go home.

## 2020-05-27 NOTE — ED Triage Notes (Signed)
EMS stated, weakness x 4 days.

## 2020-05-28 ENCOUNTER — Emergency Department (HOSPITAL_BASED_OUTPATIENT_CLINIC_OR_DEPARTMENT_OTHER): Payer: Medicare HMO

## 2020-05-28 ENCOUNTER — Other Ambulatory Visit: Payer: Self-pay

## 2020-05-28 ENCOUNTER — Inpatient Hospital Stay (HOSPITAL_BASED_OUTPATIENT_CLINIC_OR_DEPARTMENT_OTHER)
Admission: EM | Admit: 2020-05-28 | Discharge: 2020-05-30 | DRG: 193 | Disposition: A | Discharge status: Home Health | Payer: Medicare HMO | Attending: Family Medicine | Admitting: Family Medicine

## 2020-05-28 ENCOUNTER — Encounter (HOSPITAL_BASED_OUTPATIENT_CLINIC_OR_DEPARTMENT_OTHER): Payer: Self-pay

## 2020-05-28 DIAGNOSIS — J189 Pneumonia, unspecified organism: Secondary | ICD-10-CM | POA: Diagnosis present

## 2020-05-28 DIAGNOSIS — I340 Nonrheumatic mitral (valve) insufficiency: Secondary | ICD-10-CM | POA: Diagnosis not present

## 2020-05-28 DIAGNOSIS — M810 Age-related osteoporosis without current pathological fracture: Secondary | ICD-10-CM | POA: Diagnosis present

## 2020-05-28 DIAGNOSIS — R9431 Abnormal electrocardiogram [ECG] [EKG]: Secondary | ICD-10-CM | POA: Diagnosis not present

## 2020-05-28 DIAGNOSIS — E43 Unspecified severe protein-calorie malnutrition: Secondary | ICD-10-CM | POA: Diagnosis present

## 2020-05-28 DIAGNOSIS — Z720 Tobacco use: Secondary | ICD-10-CM | POA: Diagnosis not present

## 2020-05-28 DIAGNOSIS — R339 Retention of urine, unspecified: Secondary | ICD-10-CM | POA: Diagnosis present

## 2020-05-28 DIAGNOSIS — R531 Weakness: Secondary | ICD-10-CM | POA: Diagnosis present

## 2020-05-28 DIAGNOSIS — J810 Acute pulmonary edema: Secondary | ICD-10-CM | POA: Diagnosis present

## 2020-05-28 DIAGNOSIS — I10 Essential (primary) hypertension: Secondary | ICD-10-CM | POA: Diagnosis present

## 2020-05-28 DIAGNOSIS — Z20822 Contact with and (suspected) exposure to covid-19: Secondary | ICD-10-CM | POA: Diagnosis present

## 2020-05-28 DIAGNOSIS — M62838 Other muscle spasm: Secondary | ICD-10-CM

## 2020-05-28 DIAGNOSIS — Z882 Allergy status to sulfonamides status: Secondary | ICD-10-CM | POA: Diagnosis not present

## 2020-05-28 DIAGNOSIS — R0603 Acute respiratory distress: Secondary | ICD-10-CM | POA: Diagnosis not present

## 2020-05-28 DIAGNOSIS — G4700 Insomnia, unspecified: Secondary | ICD-10-CM | POA: Diagnosis present

## 2020-05-28 DIAGNOSIS — E876 Hypokalemia: Secondary | ICD-10-CM | POA: Diagnosis present

## 2020-05-28 DIAGNOSIS — I361 Nonrheumatic tricuspid (valve) insufficiency: Secondary | ICD-10-CM | POA: Diagnosis not present

## 2020-05-28 DIAGNOSIS — Z880 Allergy status to penicillin: Secondary | ICD-10-CM | POA: Diagnosis not present

## 2020-05-28 DIAGNOSIS — Z681 Body mass index (BMI) 19 or less, adult: Secondary | ICD-10-CM

## 2020-05-28 DIAGNOSIS — G35 Multiple sclerosis: Secondary | ICD-10-CM

## 2020-05-28 DIAGNOSIS — J9601 Acute respiratory failure with hypoxia: Secondary | ICD-10-CM | POA: Diagnosis present

## 2020-05-28 DIAGNOSIS — Z79899 Other long term (current) drug therapy: Secondary | ICD-10-CM

## 2020-05-28 DIAGNOSIS — F17210 Nicotine dependence, cigarettes, uncomplicated: Secondary | ICD-10-CM | POA: Diagnosis present

## 2020-05-28 DIAGNOSIS — J811 Chronic pulmonary edema: Secondary | ICD-10-CM | POA: Diagnosis present

## 2020-05-28 DIAGNOSIS — J690 Pneumonitis due to inhalation of food and vomit: Secondary | ICD-10-CM

## 2020-05-28 LAB — BASIC METABOLIC PANEL
Anion gap: 13 (ref 5–15)
Anion gap: 9 (ref 5–15)
BUN: 8 mg/dL (ref 8–23)
BUN: 8 mg/dL (ref 8–23)
CO2: 30 mmol/L (ref 22–32)
CO2: 26 mmol/L (ref 22–32)
Calcium: 9 mg/dL (ref 8.9–10.3)
Calcium: 8.6 mg/dL — ABNORMAL LOW (ref 8.9–10.3)
Chloride: 91 mmol/L — ABNORMAL LOW (ref 98–111)
Chloride: 101 mmol/L (ref 98–111)
Creatinine, Ser: 0.72 mg/dL (ref 0.44–1.00)
Creatinine, Ser: 0.79 mg/dL (ref 0.44–1.00)
GFR, Estimated: >60 mL/min (ref >60)
GFR, Estimated: >60 mL/min (ref >60)
Glucose, Bld: 152 mg/dL — ABNORMAL HIGH (ref 70–99)
Glucose, Bld: 123 mg/dL — ABNORMAL HIGH (ref 70–99)
Potassium: 2.2 mmol/L — CL (ref 3.5–5.1)
Potassium: 4 mmol/L (ref 3.5–5.1)
Sodium: 134 mmol/L — ABNORMAL LOW (ref 135–145)
Sodium: 136 mmol/L (ref 135–145)

## 2020-05-28 LAB — CBC WITH DIFFERENTIAL/PLATELET
Abs Immature Granulocytes: 0.03 10*3/uL (ref 0.00–0.07)
Basophils Absolute: 0.1 10*3/uL (ref 0.0–0.1)
Basophils Relative: 1 %
Eosinophils Absolute: 0 10*3/uL (ref 0.0–0.5)
Eosinophils Relative: 1 %
HCT: 43.7 % (ref 36.0–46.0)
Hemoglobin: 15.3 g/dL — ABNORMAL HIGH (ref 12.0–15.0)
Immature Granulocytes: 0 %
Lymphocytes Relative: 18 %
Lymphs Abs: 1.2 10*3/uL (ref 0.7–4.0)
MCH: 36.7 pg — ABNORMAL HIGH (ref 26.0–34.0)
MCHC: 35 g/dL (ref 30.0–36.0)
MCV: 104.8 fL — ABNORMAL HIGH (ref 80.0–100.0)
Monocytes Absolute: 0.9 10*3/uL (ref 0.1–1.0)
Monocytes Relative: 13 %
Neutro Abs: 4.7 10*3/uL (ref 1.7–7.7)
Neutrophils Relative %: 67 %
Platelets: 259 10*3/uL (ref 150–400)
RBC: 4.17 MIL/uL (ref 3.87–5.11)
RDW: 14.7 % (ref 11.5–15.5)
WBC: 7 10*3/uL (ref 4.0–10.5)
nRBC: 0 % (ref 0.0–0.2)

## 2020-05-28 LAB — BLOOD GAS, VENOUS
Acid-Base Excess: 3.6 mmol/L — ABNORMAL HIGH (ref 0.0–2.0)
Bicarbonate: 30.8 mmol/L — ABNORMAL HIGH (ref 20.0–28.0)
O2 Saturation: 26.7 %
Patient temperature: 98.6
pCO2, Ven: 59.9 mmHg (ref 44.0–60.0)
pH, Ven: 7.331 (ref 7.250–7.430)
pO2, Ven: <31 mmHg — CL (ref 32.0–45.0)

## 2020-05-28 LAB — RESP PANEL BY RT-PCR (FLU A&B, COVID) ARPGX2
Influenza A by PCR: NEGATIVE
Influenza B by PCR: NEGATIVE
SARS Coronavirus 2 by RT PCR: NEGATIVE

## 2020-05-28 LAB — BRAIN NATRIURETIC PEPTIDE: B Natriuretic Peptide: 1428.1 pg/mL — ABNORMAL HIGH (ref 0.0–100.0)

## 2020-05-28 LAB — MAGNESIUM
Magnesium: 1.3 mg/dL — ABNORMAL LOW (ref 1.7–2.4)
Magnesium: 2.5 mg/dL — ABNORMAL HIGH (ref 1.7–2.4)

## 2020-05-28 LAB — MRSA PCR SCREENING: MRSA by PCR: NEGATIVE

## 2020-05-28 MED ORDER — IOHEXOL 350 MG/ML SOLN
100.0000 mL | Freq: Once | INTRAVENOUS | Status: AC | PRN
  Administered 2020-05-28: 100 mL via INTRAVENOUS

## 2020-05-28 MED ORDER — IPRATROPIUM-ALBUTEROL 0.5-2.5 (3) MG/3ML IN SOLN: 3.0000 mL | RESPIRATORY_TRACT | Status: DC | PRN

## 2020-05-28 MED ORDER — POTASSIUM CHLORIDE CRYS ER 20 MEQ PO TBCR
40.0000 meq | EXTENDED_RELEASE_TABLET | Freq: Once | ORAL | Status: AC
  Administered 2020-05-28: 40 meq via ORAL
  Filled 2020-05-28: qty 2

## 2020-05-28 MED ORDER — CHLORHEXIDINE GLUCONATE CLOTH 2 % EX PADS
6.0000 | MEDICATED_PAD | Freq: Every day | CUTANEOUS | Status: DC
  Administered 2020-05-28 – 2020-05-30 (×3): 6 via TOPICAL

## 2020-05-28 MED ORDER — POTASSIUM CHLORIDE 10 MEQ/100ML IV SOLN
10.0000 meq | INTRAVENOUS | Status: AC
  Administered 2020-05-28: 10 meq via INTRAVENOUS
  Filled 2020-05-28 (×2): qty 100

## 2020-05-28 MED ORDER — MAGNESIUM SULFATE 50 % IJ SOLN: 1.0000 g | Freq: Once | INTRAMUSCULAR | Status: DC

## 2020-05-28 MED ORDER — ALBUTEROL SULFATE HFA 108 (90 BASE) MCG/ACT IN AERS
2.0000 | INHALATION_SPRAY | Freq: Once | RESPIRATORY_TRACT | Status: AC
  Administered 2020-05-28: 2 via RESPIRATORY_TRACT
  Filled 2020-05-28: qty 6.7

## 2020-05-28 MED ORDER — MAGNESIUM SULFATE 50 % IJ SOLN: 2.0000 g | Freq: Once | INTRAMUSCULAR | Status: DC

## 2020-05-28 MED ORDER — ENOXAPARIN SODIUM 30 MG/0.3ML ~~LOC~~ SOLN
30.0000 mg | SUBCUTANEOUS | Status: DC
  Administered 2020-05-28 – 2020-05-29 (×2): 30 mg via SUBCUTANEOUS
  Filled 2020-05-28 (×2): qty 0.3

## 2020-05-28 MED ORDER — HYDRALAZINE HCL 20 MG/ML IJ SOLN
10.0000 mg | Freq: Three times a day (TID) | INTRAMUSCULAR | Status: DC | PRN
Start: 2020-05-28 — End: 2020-05-30
  Administered 2020-05-28 – 2020-05-30 (×4): 10 mg via INTRAVENOUS
  Filled 2020-05-28 (×5): qty 1

## 2020-05-28 MED ORDER — ACETAMINOPHEN 650 MG RE SUPP: 650.0000 mg | Freq: Four times a day (QID) | RECTAL | Status: DC | PRN

## 2020-05-28 MED ORDER — SODIUM CHLORIDE 0.9 % IV SOLN
INTRAVENOUS | Status: DC | PRN
  Administered 2020-05-28: 1000 mL via INTRAVENOUS

## 2020-05-28 MED ORDER — SODIUM CHLORIDE 0.9 % IV SOLN: 100.0000 mg | Freq: Once | INTRAVENOUS | Status: DC

## 2020-05-28 MED ORDER — FUROSEMIDE 10 MG/ML IJ SOLN
20.0000 mg | Freq: Once | INTRAMUSCULAR | Status: AC
  Administered 2020-05-28: 20 mg via INTRAVENOUS
  Filled 2020-05-28: qty 2

## 2020-05-28 MED ORDER — HYDROCODONE-ACETAMINOPHEN 5-325 MG PO TABS
1.0000 | ORAL_TABLET | ORAL | Status: DC | PRN
Start: 2020-05-28 — End: 2020-05-30
  Administered 2020-05-29: 1 via ORAL
  Filled 2020-05-28: qty 1

## 2020-05-28 MED ORDER — ACETAMINOPHEN 325 MG PO TABS: 650.0000 mg | ORAL_TABLET | Freq: Four times a day (QID) | ORAL | Status: DC | PRN

## 2020-05-28 MED ORDER — DOXYCYCLINE HYCLATE 100 MG PO TABS
100.0000 mg | ORAL_TABLET | Freq: Two times a day (BID) | ORAL | Status: DC
  Administered 2020-05-28 – 2020-05-30 (×4): 100 mg via ORAL
  Filled 2020-05-28 (×4): qty 1

## 2020-05-28 MED ORDER — POTASSIUM CHLORIDE 10 MEQ/100ML IV SOLN
10.0000 meq | Freq: Once | INTRAVENOUS | Status: DC
  Filled 2020-05-28: qty 100

## 2020-05-28 MED ORDER — SODIUM CHLORIDE 0.9% FLUSH
3.0000 mL | Freq: Two times a day (BID) | INTRAVENOUS | Status: DC
  Administered 2020-05-28 – 2020-05-30 (×4): 3 mL via INTRAVENOUS

## 2020-05-28 MED ORDER — OXYCODONE HCL 5 MG PO TABS: 5.0000 mg | ORAL_TABLET | ORAL | Status: DC | PRN

## 2020-05-28 MED ORDER — MAGNESIUM SULFATE IN D5W 1-5 GM/100ML-% IV SOLN
1.0000 g | Freq: Once | INTRAVENOUS | Status: AC
  Administered 2020-05-28: 1 g via INTRAVENOUS
  Filled 2020-05-28: qty 100

## 2020-05-28 MED ORDER — MAGNESIUM SULFATE 50 % IJ SOLN
3.0000 g | Freq: Once | INTRAVENOUS | Status: AC
  Administered 2020-05-28: 3 g via INTRAVENOUS
  Filled 2020-05-28: qty 6

## 2020-05-28 MED ORDER — SODIUM CHLORIDE 0.9 % IV BOLUS
1000.0000 mL | Freq: Once | INTRAVENOUS | Status: AC
  Administered 2020-05-28: 1000 mL via INTRAVENOUS

## 2020-05-28 MED ORDER — POTASSIUM CHLORIDE 10 MEQ/100ML IV SOLN
10.0000 meq | INTRAVENOUS | Status: AC
  Administered 2020-05-28 (×4): 10 meq via INTRAVENOUS
  Filled 2020-05-28: qty 100

## 2020-05-28 MED ORDER — POLYETHYLENE GLYCOL 3350 17 G PO PACK: 17.0000 g | PACK | Freq: Every day | ORAL | Status: DC | PRN

## 2020-05-28 MED ORDER — ORAL CARE MOUTH RINSE
15.0000 mL | Freq: Two times a day (BID) | OROMUCOSAL | Status: DC
  Administered 2020-05-28 – 2020-05-30 (×4): 15 mL via OROMUCOSAL

## 2020-05-28 NOTE — Progress Notes (Signed)
Noted pt has frequently c/o discomfort while urinating. Now pt reports she can not void ( pt voided 600cc at 1945). Bladder scan reveals 600 cc in bladder. Pt requesting foley. Call placed to on call and foley requested per pt request. Pt was educated on all complications r/t cath insertions and pt expressed an understanding. Physician notes we will follow the standard protocol prior to the insertion of a foley cath. Pt made aware of plan of care.

## 2020-05-28 NOTE — ED Notes (Signed)
Pt placed flat for purewick placement and position change.  Pt placed on pulse ox, noted excellent waveform and pulse ox noted at 50-60%. HOB elevated and placed on [place] at 2L per RT and noted sats increased to 70's.  Pt then placed on NRB, sats slowly increased to 90's.  Pulse ox waveform was excellent the entire time.  Pt sleepy, responds to voice.  Dr. Lockie Mola notified by RT

## 2020-05-28 NOTE — Progress Notes (Signed)
Critical Value: Venous pO2 <31.0.  Dairl Ponder MD notified. No New orders at this time

## 2020-05-28 NOTE — ED Notes (Signed)
[facility] TE[facility] AT BEDSIDE, UPDATED REPORT GIVEN TO TEAM

## 2020-05-28 NOTE — ED Notes (Signed)
NOW REQUIRES NRB SECONDARY TO POX ON RA OF 72% EDP HAS BEEN INFORMED. HR 86/MIN, RR 28/MIN, POX WITH NRB IS 100%, RESP SLIGHTLY INCREASED SINCE LAST VS ASSESSMENT, RHONCHI NOTED BILATERLY

## 2020-05-28 NOTE — ED Triage Notes (Signed)
Patient reports weakness x 5 days, reports she is unable to walk without falling, getting progressively worse, patient also having muscle spasms/cramps in hands and feet, nausea/vomiting.  Patient was at [facility] last night and left from the waiting room, potassium was 2.6 per chart.

## 2020-05-28 NOTE — ED Notes (Signed)
EDP IN ROOM, PT VERY ALERT NOW, POX ON RA IS 98%, PT WAS PLACED ON  AT 4LPM, RESP THERAPIST IN ROOM WITH IS DEVICE

## 2020-05-28 NOTE — ED Provider Notes (Addendum)
[facility] EMERGENCY DEPARTMENT Provider Note   CSN: [id number] Arrival date & time: 05/28/20  1135     History Chief Complaint  Patient presents with  . Weakness    Shawna Smith is a 65 y.o. female.  Patient here with some general weakness for the last several days.  Having some spasms in her feet and calves.  Sometimes in her hands.  Not having any vision changes, unilateral weakness or stroke symptoms.  Nuys any infectious symptoms.  The history is provided by the patient.  Weakness Severity:  Mild Onset quality:  Gradual Timing:  Constant Progression:  Unchanged Chronicity:  New Context comment:  Muscles spasms and generalized weeakness. Hx of MS Relieved by:  Nothing Worsened by:  Nothing Associated symptoms: no abdominal pain, no arthralgias, no chest pain, no cough, no dysuria, no fever, no seizures, no shortness of breath and no vomiting   Risk factors: neurologic disease   Risk factors: no anemia        Past Medical History:  Diagnosis Date  . MS (multiple sclerosis) (HCC)   . Osteoporosis     Patient Active Problem List   Diagnosis Date Noted  . Generalized weakness 05/28/2020  . Hand fracture, left 06/18/2018  . Metacarpal bone fracture 06/18/2018    Past Surgical History:  Procedure Laterality Date  . OPEN REDUCTION INTERNAL FIXATION (ORIF) METACARPAL Left 06/18/2018   Procedure: OPEN REDUCTION INTERNAL FIXATION (ORIF) LEFT HAND, MUTIPLE LEFT METACARPAL FRACTURES, AND OPEN REDUCTION OF MCP JOINT OF LEFT INDEX FINGER;  Surgeon: Dominica Severin, MD;  Location: [facility] OR;  Service: Orthopedics;  Laterality: Left;     OB History   No obstetric history on file.     History reviewed. No pertinent family history.  Social History   Tobacco Use  . Smoking status: Current Every Day Smoker    Packs/day: 0.50    Types: Cigarettes  . Smokeless tobacco: Never Used  Substance Use Topics  . Alcohol use: Never  . Drug use: Never    Home  Medications Prior to Admission medications   Medication Sig Start Date End Date Taking? Authorizing Provider  doxycycline (VIBRAMYCIN) 50 MG capsule Take 2 capsules (100 mg total) by mouth 2 (two) times daily. 06/19/18   Dominica Severin, MD  methocarbamol (ROBAXIN) 500 MG tablet Take 1 tablet (500 mg total) by mouth every 6 (six) hours as needed for muscle spasms. 06/19/18   Dominica Severin, MD  oxyCODONE 10 MG TABS Take 0.5 tablets (5 mg total) by mouth every 4 (four) hours as needed for severe pain (pain score 7-10). 06/19/18   Dominica Severin, MD    Allergies    Penicillins and Sulfa antibiotics  Review of Systems   Review of Systems  Constitutional: Negative for chills and fever.  HENT: Negative for ear pain and sore throat.   Eyes: Negative for pain and visual disturbance.  Respiratory: Negative for cough and shortness of breath.   Cardiovascular: Negative for chest pain and palpitations.  Gastrointestinal: Negative for abdominal pain and vomiting.  Genitourinary: Negative for dysuria and hematuria.  Musculoskeletal: Negative for arthralgias and back pain.       Muscle spasms  Skin: Negative for color change, pallor, rash and wound.  Neurological: Positive for weakness. Negative for seizures and syncope.  All other systems reviewed and are negative.   Physical Exam Updated Vital Signs  ED Triage Vitals [05/28/20 1144]  Enc Vitals Group     BP (!) 192/103  Pulse Rate 78     Resp 18     Temp 98.5 F (36.9 C)     Temp Source Oral     SpO2      Weight 92 lb (41.7 kg)     Height 5\' 3"  (1.6 m)     Head Circumference      Peak Flow      Pain Score      Pain Loc      Pain Edu?      Excl. in GC?     Physical Exam Vitals and nursing note reviewed.  Constitutional:      General: She is not in acute distress.    Appearance: She is well-developed. She is not ill-appearing.  HENT:     Head: Normocephalic and atraumatic.     Nose: Nose normal.     Mouth/Throat:      Mouth: Mucous membranes are moist.  Eyes:     Conjunctiva/sclera: Conjunctivae normal.     Pupils: Pupils are equal, round, and reactive to light.  Cardiovascular:     Rate and Rhythm: Normal rate and regular rhythm.     Pulses: Normal pulses.     Heart sounds: Normal heart sounds. No murmur heard.   Pulmonary:     Effort: Pulmonary effort is normal. No respiratory distress.     Breath sounds: Normal breath sounds.  Abdominal:     General: Abdomen is flat.     Palpations: Abdomen is soft.     Tenderness: There is no abdominal tenderness.  Musculoskeletal:        General: No tenderness. Normal range of motion.     Cervical back: Normal range of motion and neck supple.  Skin:    General: Skin is warm and dry.     Capillary Refill: Capillary refill takes less than 2 seconds.  Neurological:     General: No focal deficit present.     Mental Status: She is alert and oriented to person, place, and time.     Cranial Nerves: No cranial nerve deficit.     Sensory: No sensory deficit.     Motor: No weakness.     Coordination: Coordination normal.     Comments: 5+/5 strength throughout, normal sensation     ED Results / Procedures / Treatments   Labs (all labs ordered are listed, but only abnormal results are displayed) Labs Reviewed  MAGNESIUM - Abnormal; Notable for the following components:      Result Value   Magnesium 1.3 (*)    All other components within normal limits  BASIC METABOLIC PANEL - Abnormal; Notable for the following components:   Sodium 134 (*)    Potassium 2.2 (*)    Chloride 91 (*)    Glucose, Bld 152 (*)    All other components within normal limits  CBC WITH DIFFERENTIAL/PLATELET - Abnormal; Notable for the following components:   Hemoglobin 15.3 (*)    MCV 104.8 (*)    MCH 36.7 (*)    All other components within normal limits  RESP PANEL BY RT-PCR (FLU A&B, COVID) [id number]  URINALYSIS, ROUTINE W REFLEX MICROSCOPIC    EKG EKG  Interpretation  Date/Time:  Tuesday May 28 2020 12:02:49 EDT Ventricular Rate:  77 PR Interval:  131 QRS Duration: 92 QT Interval:  552 QTC Calculation: 625 R Axis:   70 Text Interpretation: Sinus rhythm Probable LVH with secondary repol abnrm QTC prolonged Reconfirmed by [id number], Apollo Timothy ([id number]) on 05/28/2020 12:48:46 PM  Radiology DG Chest Portable 1 View  Result Date: 05/28/2020 CLINICAL DATA:  Weakness. EXAM: PORTABLE CHEST 1 VIEW COMPARISON:  May 31, 2019. FINDINGS: The heart size and mediastinal contours are within normal limits. No pneumothorax or pleural effusion is noted. Left lung is clear. Mild right midlung subsegmental atelectasis or infiltrate is noted. The visualized skeletal structures are unremarkable. IMPRESSION: Mild right midlung subsegmental atelectasis or infiltrate. Aortic Atherosclerosis (ICD10-I70.0). Electronically Signed   By: Lupita RaiderJames  Green Jr M.D.   On: 05/28/2020 12:44    Procedures Procedures   Medications Ordered in ED Medications  potassium chloride 10 mEq in 100 mL IVPB (0 mEq Intravenous Stopped 05/28/20 1329)  potassium chloride 10 mEq in 100 mL IVPB (has no administration in time range)  potassium chloride 10 mEq in 100 mL IVPB (10 mEq Intravenous New Bag/Given 05/28/20 1336)  magnesium sulfate IVPB 1 g 100 mL (has no administration in time range)  magnesium sulfate (IV Push/IM) injection 2 g (has no administration in time range)  0.9 %  sodium chloride infusion (1,000 mLs Intravenous New Bag/Given 05/28/20 1333)  doxycycline (VIBRAMYCIN) 100 mg in sodium chloride 0.9 % 250 mL IVPB (has no administration in time range)  albuterol (VENTOLIN HFA) 108 (90 Base) MCG/ACT inhaler 2 puff (has no administration in time range)  sodium chloride 0.9 % bolus 1,000 mL (0 mLs Intravenous Stopped 05/28/20 1328)  potassium chloride SA (KLOR-CON) CR tablet 40 mEq (40 mEq Oral Given 05/28/20 1229)    ED Course  I have reviewed the triage vital signs and the nursing  notes.  Pertinent labs & imaging results that were available during my care of the patient were reviewed by me and considered in my medical decision making (see chart for details).    MDM Rules/Calculators/A&P                          Job Foundsva Mcgath is a 65 year old female with history of MS who presents the ED with generalized weakness, muscle spasms in her feet and hands.  One episode of emesis.  Denies any diarrhea, chest pain, shortness of breath.  No vision changes.  No unilateral weakness.  Just feels slightly run down.  Typically ambulates with a cane but getting more difficult due to having some spasms in her feet.  Patient went [facility] yesterday but never was seen.  Blood work showed a potassium of 2.6 otherwise lab work was unremarkable.  Suspect potassium being low causing her muscle spasms.  Does not appear to have symptoms suggestive of MS flare.  Overall will rehydrate recheck potassium and magnesium.  Will give IV potassium and oral potassium.  Suspect that symptoms will improve with potassium repletion.  No concern for stroke.  EKG shows sinus rhythm.  QTC is prolonged to >600.  Repeat potassium is down to 2.2.  Magnesium also low at 1.3.  Overall suspect that this is because in spasms and causing her generalized weakness.  May be even an MS flare.  Will admit for potassium repletion, magnesium repletion and IV fluids.  She does not appear to be safe to go home as balance has been an issue now as well.  Will admit to medicine.  X-ray did show questionable right-sided infiltrate.  Has a chronic cough.  Does choke up on food at times.  Will talk to medicine about may be coverage for aspiration pneumonia.  No fever.  No white count.  No concern for sepsis.  We will start antibiotics to cover for possible aspiration pneumonia.  Having some desaturation events likely secondary to pneumonia.  We will keep her on 2 L of oxygen.  We will do some pulmonary toilet.  Patient having some  intermittent drops in her oxygen despite being on 4 L.  We will get a CT scan of her chest.  We will upgrade her bed to a stepdown bed.  Suspect some mucous plugging.  This chart was dictated using voice recognition software.  Despite best efforts to proofread,  errors can occur which can change the documentation meaning.    Final Clinical Impression(s) / ED Diagnoses Final diagnoses:  Hypokalemia  Hypomagnesemia  Weakness  Muscle spasm  QT prolongation  Aspiration pneumonia of right middle lobe, unspecified aspiration pneumonia type [facility])    Rx / DC Orders ED Discharge Orders    None       Virgina Norfolk, DO 05/28/20 1251    Virgina Norfolk, DO 05/28/20 1252    Lockie Mola, Abree Romick, DO 05/28/20 1303    Lockie Mola, Neville Pauls, DO 05/28/20 1417    Ziyan Schoon, DO 05/28/20 1458

## 2020-05-28 NOTE — ED Notes (Signed)
PHONE REPORT GIVEN TO Shawna Smith WITH [facility]

## 2020-05-28 NOTE — ED Notes (Signed)
RT helping RN with procedure that we laid flat for. SAT 55-60% on RA laying flat, perfect pleth. NRB placed, and MD notified. Attempted to wean back down, and not successful. Still on NRB, SAT 88-90%

## 2020-05-28 NOTE — ED Notes (Signed)
To CT scan via stretcher, cont with cont cardiac monitoring with RN at bedside.

## 2020-05-28 NOTE — ED Notes (Signed)
MD asked RT to do some pulmonary toilet, and try to wean off NRB. I instructed her with flutter and IS, tolerated well. Able to cough up small amount. Weaned to 4L, currently 90%.

## 2020-05-28 NOTE — ED Notes (Signed)
Patient placed back on NRB. SAT 87% on 6L. Patient having to lay flat shortly for CT, so I feel this is appropriate.

## 2020-05-28 NOTE — H&P (Signed)
History and Physical        Hospital Admission Note Date: 05/28/2020  Patient name: Shawna Smith Medical record number: [id number] Date of birth: [birth year] Age: 65 y.o. Gender: female  PCP: Ardyth Gal, MD  Patient coming from: home Lives with: mother At baseline, ambulates: Librarian, academic Complaint    Chief Complaint  Patient presents with  . Weakness      HPI:   This is a 65 year old female with a past medical history of multiple sclerosis on aubagio, osteoporosis, tobacco abuse (3/4 ppd) who presented to [facility] ED with generalized weakness and muscle spasms in her feet/calves and sometimes her hands for the past several days.  She went to [facility] yesterday but was never seen and left. She states she has been without some of her medications for two months but unclear which ones. Reports subjective fever and productive cough.  No vision changes, unilateral weakness or strokelike symptoms.  No abdominal pain, chest pain.    ED Course: Afebrile, hypertensive, hypoxic (SpO2 Down to 60% on room air) placed on HFNC 15 L/min. Notable Labs: Sodium 134, K2.2, chloride 91, glucose 152, BUN 8, creatinine 0.72, Mg 1.3, WBC 7.0, Hb 15.3, platelets 259, COVID-19 and flu negative. Notable Imaging: CXR-mild right midlung subsegmental atelectasis or infiltrate.  CTA chest-bilateral small pleural effusions and interstitial thickening concerning for pulmonary edema, no PE. Patient received Albuterol, potassium IV and p.o. magnesium sulfate.  EKG: Sinus rhythm with prolonged QTc 625 ms.  Patient was transferred to [facility] for further management.   Vitals:   05/28/20 1518 05/28/20 1700  BP:  (!) 156/136  Pulse: 84 (!) 55  Resp: (!) 26 (!) 25  Temp:    SpO2: 95% 100%     Review of Systems:  Review of Systems  All other systems reviewed and are negative.   Medical/Social/Family History   Past Medical  History: Past Medical History:  Diagnosis Date  . MS (multiple sclerosis) (HCC)   . Osteoporosis     Past Surgical History:  Procedure Laterality Date  . OPEN REDUCTION INTERNAL FIXATION (ORIF) METACARPAL Left 06/18/2018   Procedure: OPEN REDUCTION INTERNAL FIXATION (ORIF) LEFT HAND, MUTIPLE LEFT METACARPAL FRACTURES, AND OPEN REDUCTION OF MCP JOINT OF LEFT INDEX FINGER;  Surgeon: Dominica Severin, MD;  Location: [facility] OR;  Service: Orthopedics;  Laterality: Left;    Medications: Prior to Admission medications   Medication Sig Start Date End Date Taking? Authorizing Provider  doxycycline (VIBRAMYCIN) 50 MG capsule Take 2 capsules (100 mg total) by mouth 2 (two) times daily. 06/19/18   Dominica Severin, MD  methocarbamol (ROBAXIN) 500 MG tablet Take 1 tablet (500 mg total) by mouth every 6 (six) hours as needed for muscle spasms. 06/19/18   Dominica Severin, MD  oxyCODONE 10 MG TABS Take 0.5 tablets (5 mg total) by mouth every 4 (four) hours as needed for severe pain (pain score 7-10). 06/19/18   Dominica Severin, MD    Allergies:   Allergies  Allergen Reactions  . Penicillins   . Sulfa Antibiotics     Social History:  reports that she has been smoking cigarettes. She has been smoking about 0.50 packs per day. She has never used smokeless tobacco. She  reports that she does not drink alcohol and does not use drugs.  Family History: History reviewed. No pertinent family history.   Objective   Physical Exam: Blood pressure (!) 156/136, pulse (!) 55, temperature 98.5 F (36.9 C), temperature source Oral, resp. rate (!) 25, height 5\' 3"  (1.6 m), weight 41.7 kg, SpO2 100 %.  Physical Exam Vitals and nursing note reviewed.  Constitutional:      General: She is not in acute distress.    Appearance: She is ill-appearing.  HENT:     Head: Normocephalic.  Eyes:     Conjunctiva/sclera: Conjunctivae normal.  Cardiovascular:     Rate and Rhythm: Normal rate and regular rhythm.  Pulmonary:      Breath sounds: Examination of the right-lower field reveals decreased breath sounds and rales. Examination of the left-lower field reveals decreased breath sounds and rales. Decreased breath sounds, wheezing and rales present.  Neurological:     Mental Status: She is alert.     LABS on Admission: I have personally reviewed all the labs and imaging below    Basic Metabolic Panel: Recent Labs  Lab 05/27/20 1859 05/28/20 1158  NA 139 134*  K 2.6* 2.2*  CL 99 91*  CO2 30 30  GLUCOSE 82 152*  BUN 7* 8  CREATININE 0.72 0.72  CALCIUM 9.0 9.0  MG  --  1.3*   Liver Function Tests: No results for input(s): AST, ALT, ALKPHOS, BILITOT, PROT, ALBUMIN in the last 168 hours. No results for input(s): LIPASE, AMYLASE in the last 168 hours. No results for input(s): AMMONIA in the last 168 hours. CBC: Recent Labs  Lab 05/27/20 1900 05/28/20 1158  WBC 4.7 7.0  NEUTROABS  --  4.7  HGB 14.7 15.3*  HCT 40.7 43.7  MCV 106.0* 104.8*  PLT 211 259   Cardiac Enzymes: No results for input(s): CKTOTAL, CKMB, CKMBINDEX, TROPONINI in the last 168 hours. BNP: Invalid input(s): POCBNP CBG: No results for input(s): GLUCAP in the last 168 hours.  Radiological Exams on Admission:  CT Angio Chest PE W and/or Wo Contrast  Result Date: 05/28/2020 CLINICAL DATA:  Shortness of breath, no chest pain EXAM: CT ANGIOGRAPHY CHEST WITH CONTRAST TECHNIQUE: Multidetector CT imaging of the chest was performed using the standard protocol during bolus administration of intravenous contrast. Multiplanar CT image reconstructions and MIPs were obtained to evaluate the vascular anatomy. CONTRAST:  05/30/2020 OMNIPAQUE IOHEXOL 350 MG/ML SOLN COMPARISON:  None. FINDINGS: Cardiovascular: Satisfactory opacification of the pulmonary arteries to the segmental level. No evidence of pulmonary embolism. Normal heart size. No pericardial effusion. Thoracic aortic atherosclerosis. Coronary artery atherosclerosis. Mediastinum/Nodes: No  enlarged mediastinal, hilar, or axillary lymph nodes. Thyroid gland, trachea, and esophagus demonstrate no significant findings. Lungs/Pleura: Bilateral small pleural effusions. Bilateral interstitial thickening. No focal consolidation. No pneumothorax. Upper Abdomen: No acute abnormality. Musculoskeletal: No acute osseous abnormality. No aggressive osseous lesion. Review of the MIP images confirms the above findings. IMPRESSION: 1. No evidence of pulmonary embolus. 2. Bilateral small pleural effusions and interstitial thickening concerning for pulmonary edema. 3. Aortic Atherosclerosis (ICD10-I70.0). Electronically Signed   By:   On: 05/28/2020 16:11   DG Chest Portable 1 View  Result Date: 05/28/2020 CLINICAL DATA:  Weakness. EXAM: PORTABLE CHEST 1 VIEW COMPARISON:  May 31, 2019. FINDINGS: The heart size and mediastinal contours are within normal limits. No pneumothorax or pleural effusion is noted. Left lung is clear. Mild right midlung subsegmental atelectasis or infiltrate is noted. The visualized skeletal structures  are unremarkable. IMPRESSION: Mild right midlung subsegmental atelectasis or infiltrate. Aortic Atherosclerosis (ICD10-I70.0). Electronically Signed   By: Lupita Raider M.D.   On: 05/28/2020 12:44      EKG: normal sinus rhythm, prolonged QT interval 625 ms   A & P   Principal Problem:   Acute respiratory failure with hypoxia (HCC) Active Problems:   Generalized weakness   Multiple sclerosis (HCC)   Hypokalemia   Hypomagnesemia   Prolonged QT interval   1. Generalized weakness  Myalgias/muscle spasms, suspect multifactorial: CAP, electrolyte abnormalities, debility a. Does not appear to be in an acute MS flare b. PT eval c. Treat underlying conditions as below  2. Acute hypoxic respiratory failure in immunocompromised patient, suspect multifactorial: CAP/aspiration pneumonia and volume overload a. Hypoxic to 60% requiring 15 L/min HFNC b. Pulmonary  edema on CT chest likely from receiving so many fluids in the ED (electrolytes and 1L NS bolus). No known CHF history c. May have undiagnosed COPD due to tobacco use as well d. Check BNP e. Lasix 20 mg IV x1 - follow up K and stick to PO if able f. Doxycycline. PO to limit further IV fluids.  i. Unfortunately she has a PCN allergy and has prolonged QTc which limits antibiotics g. Follow up sputum culture h. Follow up MRSA nares i. DuoNebs PRN j. Flutter valve and IS k. If no improvement with above then would add on steroids but will hold off for now   3. Hypokalemia a. repleted both IV and PO b. Repeat BMP this evening. Stick to PO if able to avoid further fluids  4. Hypomagnesemia a. Follow up this evening and in AM  5. Prolonged QTc a. Correct electrolyte abnormalities b. Repeat EKG this PM  c. Holding home Robaxin d. telemetry  6. Multiple Sclerosis, not in acute flare a. On Aubagio outpatient, holding for now b. Continue home oxycodone for severe pain  7. Tobacco use a. Smokes 3/4 pack per day b. Advised cessation c. Declines nicotine patch    DVT prophylaxis: lovenox   Code Status: Full Code  Diet: heart healthy Family Communication: Admission, patients condition and plan of care including tests being ordered have been discussed with the patient who indicates understanding and agrees with the plan and Code Status. Disposition Plan: The appropriate patient status for this patient is INPATIENT. Inpatient status is judged to be reasonable and necessary in order to provide the required intensity of service to ensure the patient's safety. The patient's presenting symptoms, physical exam findings, and initial radiographic and laboratory data in the context of their chronic comorbidities is felt to place them at high risk for further clinical deterioration. Furthermore, it is not anticipated that the patient will be medically stable for discharge from the hospital within 2  midnights of admission. The following factors support the patient status of inpatient.   " The patient's presenting symptoms include myalgias, muscle spasms, shortness of breath. " The worrisome physical exam findings include hypoxia, rales, wheeze. " The initial radiographic and laboratory data are worrisome because of hypokalemia, hypomagnesemia, pulmonary edema . " The chronic co-morbidities include MS.   * I certify that at the point of admission it is my clinical judgment that the patient will require inpatient hospital care spanning beyond 2 midnights from the point of admission due to high intensity of service, high risk for further deterioration and high frequency of surveillance required.*   Status is: Inpatient  Remains inpatient appropriate because:Persistent severe electrolyte disturbances, IV  treatments appropriate due to intensity of illness or inability to take PO and Inpatient level of care appropriate due to severity of illness   Dispo: The patient is from: Home              Anticipated d/c is to: Home              Patient currently is not medically stable to d/c.   Difficult to place patient No         The medical decision making on this patient was of high complexity and the patient is at high risk for clinical deterioration, therefore this is a level 3 admission.  Consultants  . none  Procedures  . none  Time Spent on Admission: 73 minutes    Jae Dire, DO [facility]  05/28/2020, 5:58 PM

## 2020-05-28 NOTE — ED Notes (Signed)
PHONE HANDOFF REPORT GIVEN TO ICU TEAM AT [facility]

## 2020-05-28 NOTE — Progress Notes (Signed)
Patient received total of 4 IV potassium runs and 40 mEq oral potassium at [facility], MD aware.

## 2020-05-29 ENCOUNTER — Inpatient Hospital Stay (HOSPITAL_COMMUNITY): Payer: Medicare HMO

## 2020-05-29 DIAGNOSIS — I361 Nonrheumatic tricuspid (valve) insufficiency: Secondary | ICD-10-CM | POA: Diagnosis not present

## 2020-05-29 DIAGNOSIS — R0603 Acute respiratory distress: Secondary | ICD-10-CM | POA: Diagnosis not present

## 2020-05-29 DIAGNOSIS — E43 Unspecified severe protein-calorie malnutrition: Secondary | ICD-10-CM

## 2020-05-29 DIAGNOSIS — G35 Multiple sclerosis: Secondary | ICD-10-CM

## 2020-05-29 DIAGNOSIS — E876 Hypokalemia: Secondary | ICD-10-CM

## 2020-05-29 DIAGNOSIS — Z720 Tobacco use: Secondary | ICD-10-CM

## 2020-05-29 DIAGNOSIS — I340 Nonrheumatic mitral (valve) insufficiency: Secondary | ICD-10-CM

## 2020-05-29 DIAGNOSIS — R531 Weakness: Secondary | ICD-10-CM

## 2020-05-29 DIAGNOSIS — J9601 Acute respiratory failure with hypoxia: Secondary | ICD-10-CM

## 2020-05-29 LAB — URINALYSIS, ROUTINE W REFLEX MICROSCOPIC
Bacteria, UA: NONE SEEN
Bilirubin Urine: NEGATIVE
Glucose, UA: NEGATIVE mg/dL
Hgb urine dipstick: NEGATIVE
Ketones, ur: NEGATIVE mg/dL
Leukocytes,Ua: NEGATIVE
Nitrite: NEGATIVE
Protein, ur: NEGATIVE mg/dL
Specific Gravity, Urine: 1.005 (ref 1.005–1.030)
pH: 8 (ref 5.0–8.0)

## 2020-05-29 LAB — CBC
HCT: 41.9 % (ref 36.0–46.0)
Hemoglobin: 14.8 g/dL (ref 12.0–15.0)
MCH: 36.9 pg — ABNORMAL HIGH (ref 26.0–34.0)
MCHC: 35.3 g/dL (ref 30.0–36.0)
MCV: 104.5 fL — ABNORMAL HIGH (ref 80.0–100.0)
Platelets: 229 10*3/uL (ref 150–400)
RBC: 4.01 MIL/uL (ref 3.87–5.11)
RDW: 14.6 % (ref 11.5–15.5)
WBC: 6.9 10*3/uL (ref 4.0–10.5)
nRBC: 0 % (ref 0.0–0.2)

## 2020-05-29 LAB — BASIC METABOLIC PANEL
Anion gap: 10 (ref 5–15)
BUN: 8 mg/dL (ref 8–23)
CO2: 25 mmol/L (ref 22–32)
Calcium: 8.7 mg/dL — ABNORMAL LOW (ref 8.9–10.3)
Chloride: 102 mmol/L (ref 98–111)
Creatinine, Ser: 0.7 mg/dL (ref 0.44–1.00)
GFR, Estimated: >60 mL/min (ref >60)
Glucose, Bld: 113 mg/dL — ABNORMAL HIGH (ref 70–99)
Potassium: 2.9 mmol/L — ABNORMAL LOW (ref 3.5–5.1)
Sodium: 137 mmol/L (ref 135–145)

## 2020-05-29 LAB — ECHOCARDIOGRAM COMPLETE
Area-P 1/2: 4.89 cm2
Height: 63 in
S' Lateral: 3.93 cm
Weight: 1472 oz

## 2020-05-29 LAB — MAGNESIUM: Magnesium: 2.4 mg/dL (ref 1.7–2.4)

## 2020-05-29 LAB — HIV ANTIBODY (ROUTINE TESTING W REFLEX): HIV Screen 4th Generation wRfx: NONREACTIVE

## 2020-05-29 MED ORDER — ADULT MULTIVITAMIN W/MINERALS CH
1.0000 | ORAL_TABLET | Freq: Every day | ORAL | Status: DC
  Administered 2020-05-29 – 2020-05-30 (×2): 1 via ORAL
  Filled 2020-05-29: qty 1

## 2020-05-29 MED ORDER — METOPROLOL SUCCINATE ER 25 MG PO TB24
100.0000 mg | ORAL_TABLET | Freq: Every day | ORAL | Status: DC
  Administered 2020-05-29 – 2020-05-30 (×2): 100 mg via ORAL
  Filled 2020-05-29 (×2): qty 4

## 2020-05-29 MED ORDER — ENSURE ENLIVE PO LIQD
237.0000 mL | Freq: Two times a day (BID) | ORAL | Status: DC
  Administered 2020-05-30: 237 mL via ORAL

## 2020-05-29 MED ORDER — TRAZODONE HCL 50 MG PO TABS
50.0000 mg | ORAL_TABLET | Freq: Every evening | ORAL | Status: DC | PRN
  Administered 2020-05-29: 50 mg via ORAL
  Filled 2020-05-29: qty 1

## 2020-05-29 MED ORDER — LORAZEPAM 2 MG/ML IJ SOLN
1.0000 mg | Freq: Once | INTRAMUSCULAR | Status: AC | PRN
  Administered 2020-05-29: 1 mg via INTRAVENOUS
  Filled 2020-05-29: qty 1

## 2020-05-29 MED ORDER — OXCARBAZEPINE 150 MG PO TABS
150.0000 mg | ORAL_TABLET | Freq: Two times a day (BID) | ORAL | Status: DC
  Administered 2020-05-29 – 2020-05-30 (×3): 150 mg via ORAL
  Filled 2020-05-29 (×3): qty 1

## 2020-05-29 MED ORDER — GADOBUTROL 1 MMOL/ML IV SOLN
4.0000 mL | Freq: Once | INTRAVENOUS | Status: AC | PRN
  Administered 2020-05-29: 4 mL via INTRAVENOUS

## 2020-05-29 MED ORDER — FUROSEMIDE 10 MG/ML IJ SOLN
20.0000 mg | Freq: Once | INTRAMUSCULAR | Status: AC
  Administered 2020-05-29: 20 mg via INTRAVENOUS
  Filled 2020-05-29: qty 2

## 2020-05-29 MED ORDER — POTASSIUM CHLORIDE 10 MEQ/100ML IV SOLN
INTRAVENOUS | Status: AC
  Filled 2020-05-29: qty 100

## 2020-05-29 MED ORDER — AMLODIPINE BESYLATE 10 MG PO TABS
10.0000 mg | ORAL_TABLET | Freq: Every day | ORAL | Status: DC
  Administered 2020-05-29 – 2020-05-30 (×2): 10 mg via ORAL
  Filled 2020-05-29 (×2): qty 1

## 2020-05-29 MED ORDER — POTASSIUM CHLORIDE CRYS ER 20 MEQ PO TBCR
40.0000 meq | EXTENDED_RELEASE_TABLET | Freq: Two times a day (BID) | ORAL | Status: AC
  Administered 2020-05-29 (×2): 40 meq via ORAL
  Filled 2020-05-29 (×2): qty 2

## 2020-05-29 MED ORDER — PROSOURCE PLUS PO LIQD
30.0000 mL | Freq: Every day | ORAL | Status: DC
  Administered 2020-05-30: 30 mL via ORAL
  Filled 2020-05-29: qty 30

## 2020-05-29 NOTE — Progress Notes (Signed)
Pt reports feeling full and she is prepared to except all consequences to have a foley placed. Pt s bladder was scanned and 225 noted in patients bladde ( avg of 3 reading) . Policy explained to pt and pt expressed an understanding.

## 2020-05-29 NOTE — Progress Notes (Signed)
PT Cancellation Note  Patient Details Name: Shawna Smith MRN: [id number] DOB: 11-Jul-1955   Cancelled Treatment:    Reason Eval/Treat Not Completed: Medical issues which prohibited therapy (pt's BP 207/5mmHg. Pt not appropriate for therapy at this time. Will follow up when pt appropriate and as schedule allows.)  Loyal Gambler, SPT  Acute rehab  Loyal Gambler 05/29/2020, 11:14 AM

## 2020-05-29 NOTE — Progress Notes (Signed)
Pt states she feels full and bladder was scanned. Noted 275cc in bladder. Pt states I know what the policy is , I will wait to see if I can pee myself IF I have to!

## 2020-05-29 NOTE — Evaluation (Signed)
SLP Cancellation Note  Patient Details Name: Shawna Smith MRN: [id number] DOB: 04-Oct-1955   Cancelled treatment:       Reason Eval/Treat Not Completed: Other (comment) (pt going to MRI at this time, will continue efforts)  Rolena Infante, MS [facility] SLP Acute Rehab Services Office [phone] Pager [phone]   Chales Abrahams 05/29/2020, 11:08 AM

## 2020-05-29 NOTE — Progress Notes (Signed)
Post IN/OUT CATH at 0045 (660cc obtained) pt is requesting another IN/OUT. Explained the protocol to the patient and she expressed an understanding

## 2020-05-29 NOTE — Evaluation (Signed)
Clinical/Bedside Swallow Evaluation Patient Details  Name: Evelette Hollern MRN: [id number] Date of Birth: 12-16-1955  Today's Date: 05/29/2020 Time: SLP Start Time (ACUTE ONLY): 1725 SLP Stop Time (ACUTE ONLY): 1800 SLP Time Calculation (min) (ACUTE ONLY): 35 min  Past Medical History:  Past Medical History:  Diagnosis Date  . MS (multiple sclerosis) (HCC)   . Osteoporosis    Past Surgical History:  Past Surgical History:  Procedure Laterality Date  . OPEN REDUCTION INTERNAL FIXATION (ORIF) METACARPAL Left 06/18/2018   Procedure: OPEN REDUCTION INTERNAL FIXATION (ORIF) LEFT HAND, MUTIPLE LEFT METACARPAL FRACTURES, AND OPEN REDUCTION OF MCP JOINT OF LEFT INDEX FINGER;  Surgeon: Dominica Severin, MD;  Location: [facility] OR;  Service: Orthopedics;  Laterality: Left;   HPI:  MD note indicates "65 year old female with a past medical history of multiple sclerosis on aubagio, osteoporosis, tobacco abuse (3/4 ppd) who presented to [facility] ED with generalized weakness and muscle spasms in her feet/calves and sometimes her hands for the past several days.    She went to [facility] yesterday but was never seen and left. She states she has been without some of her medications for two months but unclear which ones. Reports subjective fever and productive cough.  No vision changes, unilateral weakness or strokelike symptoms.  No abdominal pain, chest pain."   In ED, pt was Afebrile, hypertensive, hypoxic (SpO2 Down to 60% on room air) placed on HFNC 15 L/min but today weaned down to 4 Liters. MRI neck Moderate disc space narrowing from C3-4 to C6-7 with associated degenerative endplate changes, C2 small chronic demyelinating plaque likely. MRI 3/30 Brain study, mild progression of white matter dx, no evidence of acute MS exacerbation  Bilateral small pleural effusions and interstitial thickening concerning for pulmonary edema per chest imaging. Swallow evaluation ordered.  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  ETOH use (30 drinks a week) and cigarette usage (25 per day).  She has previously been informed to attempt for ETOH and smoking cessation.  Pt reports to this SLP that she will "try".  Pt admits to taking pain pills and consuming alcohol at times - which SLP advised against due to depression of neurological system.     Assessment / Plan / Recommendation Clinical Impression  Pt presents with minimal right facial asymmetry - has h/o CVA impacting left occipital lobe, concern for oral candidiasis, and congested cough.  No overt indication of aspiration with po consumed (minimal - 2 bites Snickers bar, 2 bites pineapple and a few sips of liquids).  Pt did not pass the Yale swallow screen as she required rest break with swallow attempts.  She has h/o trigeminal neuralgia, severe xerostomia and ? oral candidiasis- which may contribute to decreased po intake but pt denies pain with eating.  Pt blames her poor intake on her "MS" initially reporting gustatory changes - but then later says she likes to eat certain things.  Mildly prolonged mastication observed but self modulates rate of mastication/po for safety - thus recommend continue regular consistency and thin with precautions.  Pt denies dysphagia but admits to "choking" on salad during hospital coarse.   Of note, pt did admit to SLP that she will take her pain medicine with ETOH and SLP advised against this due to system depressants and potential pulmonary ramifications.   Pt also educated to swallowing/diet recommendations and provided written tips.  Messaged RN with recommendations via secure chat  and posted precautions.  Will follow up x1 to assure tolerance of po, education completed and to assess for indication of instrumental evaluation. SLP Visit Diagnosis: Dysphagia, unspecified (R13.10)    Aspiration Risk  Mild aspiration risk    Diet Recommendation  Regular;Thin liquid   Liquid Administration via: Straw;Cup Medication Administration: Whole meds with liquid Supervision: Patient able to self feed Compensations: Slow rate;Small sips/bites Postural Changes: Seated upright at 90 degrees;Remain upright for at least 30 minutes after po intake    Other  Recommendations Oral Care Recommendations: Oral care BID   Follow up Recommendations None      Frequency and Duration min 1 x/week  1 week       Prognosis Prognosis for Safe Diet Advancement: Good Barriers to Reach Goals: Motivation      Swallow Study   General      Oral/Motor/Sensory Function Overall Oral Motor/Sensory Function: Other (comment) (? minimal left facial asymmetry, otherwise unremarkable)   Ice Chips Ice chips: Not tested   Thin Liquid Thin Liquid: Within functional limits Presentation: Self Fed;Straw;Cup Other Comments: pt did not pass 3 ounce Yale water screen due to requiring frequent rest breaks but she willingly attempted, no s/s of aspiration    Nectar Thick Nectar Thick Liquid: Not tested   Honey Thick Honey Thick Liquid: Not tested   Puree Puree: Not tested   Solid     Solid: Impaired Oral Phase Impairments: Impaired mastication Other Comments: pt with prolonged mastication but no oral retention, suspect h/o trigeminal neuralgia may negatively impact her mastication, she feeds herself and eats slowly - thus recommend to continue diet to allow her to choose foods she can tolerate      Chales Abrahams 05/29/2020,6:29 PM   Rolena Infante, MS [facility] SLP Acute Rehab Services Office [phone] Pager [phone]

## 2020-05-29 NOTE — Progress Notes (Signed)
NOTED bladder scan reveals 490 in bladder. I/O cath reveals 790cc drained from bladder. On going assessment

## 2020-05-29 NOTE — Progress Notes (Signed)
PROGRESS NOTE  Shawna Foundsva Hyslop  ZOX:[id number] DOB: 1955-09-27 DOA: 05/28/2020 PCP: Ardyth GalYbanez, Jane, MD   Brief Narrative: Shawna Smith is a 65 y.o. female with a history of MS on aubagio, toabacco use, and osteoporosis who [facility] with weakness and muscle spasms and subjective fever. On arrival she was afebrile, hypertensive, hypoxic requiring 15LPM, severely hypokalemic with right midlung opacities on CXR further defined to be consistent with pulmonary edema with bilateral small pleural effusions on CTA chest (no PE). Patient received Albuterol, potassium IV and p.o. magnesium sulfate.  EKG: Sinus rhythm with prolonged QTc 625 ms.  [facility].  Assessment & Plan: Principal Problem:   Acute respiratory failure with hypoxia (HCC) Active Problems:   Generalized weakness   Multiple sclerosis (HCC)   Hypokalemia   Hypomagnesemia   Prolonged QT interval   Tobacco use  Acute hypoxic respiratory failure due to CAP and acute pulmonary edema:  - BNP grossly elevated, though not peripherally overloaded. Edema seen on chest imaging. Will repeat lasix this AM. Check echocardiogram.  - Continue doxycycline (abx limited by allergies, prolonged QT). No leukocytosis.  - SLP evaluation as aspiration is a consideration and has poor oral care.   MS: Dx about 10 years ago, [place] [facility]. Feels some of her symptoms are similar to previous flares, though finds this difficult to define. Symptoms currently are diffuse and not only in LE's.  - D/w neurology, Dr. Derry LoryKhaliqdina who recommends MRI brain, C-spine w/ and w/o contrast which is ordered. We will start steroids only if new demyelinating lesions are noted.  - Ok to hold [place]ub[place]io currently.  - PT eval requested.  Acute urinary retention: ?If Sx of MS flare - Attempt BSC which may improve ability to void.  - I/O prn - D/w RN need to send UA  Hypokalemia: Significant and refractory to  supplementation.  - Augment supplementation especially in light of repeat loop diuretic dosing.  Hypomagnesemia:  - Supplement and monitor.   HTN:  - Restart home norvasc, metoprolol. Holding ACE/HCTZ in setting of diuresis.   Prolonged QTc: Likely a product of severe hypokalemia. - Monitor telemetry, QTc 460msec on personal review of telemetry today.  - Avoid provocative agents as much as possible.   Insomnia: Pt requests restart trazodone.  - Restart trazodone at lowest possible dose  DVT prophylaxis: Lovenox Code Status: Full Family Communication: None at bedside Disposition Plan:  Status is: Inpatient  Remains inpatient appropriate because:Persistent severe electrolyte disturbances, Ongoing diagnostic testing needed not appropriate for outpatient work up and Inpatient level of care appropriate due to severity of illness   Dispo: The patient is from: Home              Anticipated d/c is to: Home              Patient currently is not medically stable to d/c.Marland Kitchen. Pt eager to be discharged, but continues to have new hypoxia and weakness with PT consult pending.    Difficult to place patient No  Consultants:   Neurology by phone 3/30  Procedures:   None  Antimicrobials:  Doxycycline    Subjective: No dyspnea at rest. Weakness was described as diffuse and relatively abrupt onset without numbness or tingling anywhere. BPs up. No HA, N/V/D. Does have intermittent leg swelling but no orthopnea. Gets around usually "slowly and with a cane."   Objective: Vitals:   05/29/20 0400 05/29/20 0407 05/29/20 0500 05/29/20 0800  BP: (!) 187/109 (!) 187/109 (!) 152/75   Pulse: 87  83   Resp: 15  11   Temp: 98.1 F (36.7 C)   98.1 F (36.7 C)  TempSrc: Oral   Oral  SpO2: 96%  97%   Weight:      Height:        Intake/Output Summary (Last 24 hours) at 05/29/2020 0857 Last data filed at 05/29/2020 0500 Gross per 24 hour  Intake 1053.97 ml  Output 2360 ml  Net -1306.03 ml    Filed Weights   05/28/20 1144  Weight: 41.7 kg   Gen: 65 y.o. female in no distress Pulm: Non-labored breathing supplemental oxygen. Crackles bilaterally  CV: Regular rate and rhythm. No murmur, rub, or gallop. No JVD, 1+ pedal edema. GI: Abdomen soft, non-tender, non-distended, with normoactive bowel sounds. No organomegaly or masses felt. Ext: Warm, no deformities Skin: No rashes, lesions or ulcers Neuro: Alert and oriented. Some diffuse weakness. No focal neurological deficits. Psych: Judgement and insight appear normal. Mood & affect appropriate.   Data Reviewed: I have personally reviewed following labs and imaging studies  CBC: Recent Labs  Lab 05/27/20 1900 05/28/20 1158 05/29/20 0234  WBC 4.7 7.0 6.9  NEUTROABS  --  4.7  --   HGB 14.7 15.3* 14.8  HCT 40.7 43.7 41.9  MCV 106.0* 104.8* 104.5*  PLT 211 259 229   Basic Metabolic Panel: Recent Labs  Lab 05/27/20 1859 05/28/20 1158 05/28/20 2035 05/29/20 0234  NA 139 134* 136 137  K 2.6* 2.2* 4.0 2.9*  CL 99 91* 101 102  CO2 30 30 26 25   GLUCOSE 82 152* 123* 113*  BUN 7* 8 8 8   CREATININE 0.72 0.72 0.79 0.70  CALCIUM 9.0 9.0 8.6* 8.7*  MG  --  1.3* 2.5* 2.4   Recent Results (from the past 240 hour(s))  Resp Panel by RT-PCR (Flu A&B, Covid) Nasopharyngeal Swab     Status: None   Collection Time: 05/28/20  1:28 PM   Specimen: Nasopharyngeal Swab; Nasopharyngeal(NP) swabs in vial transport medium  Result Value Ref Range Status   SARS Coronavirus 2 by RT PCR NEGATIVE NEGATIVE Final    Comment: (NOTE) SARS-CoV-2 target nucleic acids are NOT DETECTED.  The SARS-CoV-2 RNA is generally detectable in upper respiratory specimens during the acute phase of infection. The lowest concentration of SARS-CoV-2 viral copies this assay can detect is 138 copies/mL. A negative result does not preclude SARS-Cov-2 infection and should not be used as the sole basis for treatment or other patient management decisions. A  negative result may occur with  improper specimen collection/handling, submission of specimen other than nasopharyngeal swab, presence of viral mutation(s) within the areas targeted by this assay, and inadequate number of viral copies(<138 copies/mL). A negative result must be combined with clinical observations, patient history, and epidemiological information. The expected result is Negative.  Fact Sheet for Patients:   Fact Sheet for Healthcare Providers:  05/30/20  This test is no t yet approved or cleared by the [employer] FDA and  has been authorized for detection and/or diagnosis of SARS-CoV-2 by FDA under an Emergency Use Authorization (EUA). This EUA will remain  in effect (meaning this test can be used) for the duration of the COVID-19 declaration under Section 564(b)(1) of the Act, 21 U.S.C.section 360bbb-3(b)(1), unless the authorization is terminated  or revoked sooner.       Influenza A by PCR NEGATIVE NEGATIVE Final   Influenza B by PCR  NEGATIVE NEGATIVE Final    Comment: (NOTE) The Xpert Xpress SARS-CoV-2/FLU/RSV plus assay is intended as an aid in the diagnosis of influenza from Nasopharyngeal swab specimens and should not be used as a sole basis for treatment. Nasal washings and aspirates are unacceptable for Xpert Xpress SARS-CoV-2/FLU/RSV testing.  Fact Sheet for Patients: BloggerCourse.com  Fact Sheet for Healthcare Providers: SeriousBroker.it  This test is not yet approved or cleared by the [place] FDA and has been authorized for detection and/or diagnosis of SARS-CoV-2 by FDA under an Emergency Use Authorization (EUA). This EUA will remain in effect (meaning this test can be used) for the duration of the COVID-19 declaration under Section 564(b)(1) of the Act, 21 U.S.C. section 360bbb-3(b)(1), unless the authorization  is terminated or revoked.  Performed at [facility], [address] [place], [place] [address]   MRSA PCR Screening     Status: None   Collection Time: 05/28/20  4:44 PM   Specimen: Nasal Mucosa; Nasopharyngeal  Result Value Ref Range Status   MRSA by PCR NEGATIVE NEGATIVE Final    Comment:        The GeneXpert MRSA Assay (FDA approved for NASAL specimens only), is one component of a comprehensive MRSA colonization surveillance program. It is not intended to diagnose MRSA infection nor to guide or monitor treatment for MRSA infections. Performed at [facility], [address], [place], [place] [address]       Radiology Studies: CT Angio Chest PE W and/or Wo Contrast  Result Date: 05/28/2020 CLINICAL DATA:  Shortness of breath, no chest pain EXAM: CT ANGIOGRAPHY CHEST WITH CONTRAST TECHNIQUE: Multidetector CT imaging of the chest was performed using the standard protocol during bolus administration of intravenous contrast. Multiplanar CT image reconstructions and MIPs were obtained to evaluate the vascular anatomy. CONTRAST:  OMNIPAQUE IOHEXOL 350 MG/ML SOLN COMPARISON:  None. FINDINGS: Cardiovascular: Satisfactory opacification of the pulmonary arteries to the segmental level. No evidence of pulmonary embolism. Normal heart size. No pericardial effusion. Thoracic aortic atherosclerosis. Coronary artery atherosclerosis. Mediastinum/Nodes: No enlarged mediastinal, hilar, or axillary lymph nodes. Thyroid gland, trachea, and esophagus demonstrate no significant findings. Lungs/Pleura: Bilateral small pleural effusions. Bilateral interstitial thickening. No focal consolidation. No pneumothorax. Upper Abdomen: No acute abnormality. Musculoskeletal: No acute osseous abnormality. No aggressive osseous lesion. Review of the MIP images confirms the above findings. IMPRESSION: 1. No evidence of pulmonary embolus. 2. Bilateral small pleural effusions and  interstitial thickening concerning for pulmonary edema. 3. Aortic Atherosclerosis (ICD10-I70.0). Electronically Signed   By: Elige Ko   On: 05/28/2020 16:11   DG Chest Portable 1 View  Result Date: 05/28/2020 CLINICAL DATA:  Weakness. EXAM: PORTABLE CHEST 1 VIEW COMPARISON:  May 31, 2019. FINDINGS: The heart size and mediastinal contours are within normal limits. No pneumothorax or pleural effusion is noted. Left lung is clear. Mild right midlung subsegmental atelectasis or infiltrate is noted. The visualized skeletal structures are unremarkable. IMPRESSION: Mild right midlung subsegmental atelectasis or infiltrate. Aortic Atherosclerosis (ICD10-I70.0). Electronically Signed   By: Lupita Raider M.D.   On: 05/28/2020 12:44    Scheduled Meds: . amLODipine  10 mg Oral Daily  . Chlorhexidine Gluconate Cloth  6 each Topical Daily  . doxycycline  100 mg Oral Q12H  . enoxaparin (LOVENOX) injection  30 mg Subcutaneous Q24H  . furosemide  20 mg Intravenous Once  . mouth rinse  15 mL Mouth Rinse BID  . metoprolol succinate  100 mg Oral Daily  .  OXcarbazepine  150 mg Oral BID  . potassium chloride  40 mEq Oral BID  . sodium chloride flush  3 mL Intravenous Q12H   Continuous Infusions: . sodium chloride 20 mL/hr at 05/29/20 0500  . potassium chloride       LOS: 1 day   Time spent: 35 minutes.  Tyrone Nine, MD [facility] [email or web address] 05/29/2020, 8:57 AM

## 2020-05-29 NOTE — Progress Notes (Signed)
Initial Nutrition Assessment  DOCUMENTATION CODES:   Severe malnutrition in context of chronic illness,Underweight  INTERVENTION:  - will order Ensure Enlive BID, each supplement provides 350 kcal and 20 grams of protein. - will order Magic Cup with lunch meals, each supplement provides 290 kcal and 9 grams of protein. - will order 30 ml Prosource Plus once/day, each supplement provides 100 kcal and 15 grams protein.  - will order 1 tablet multivitamin with minerals/day.    NUTRITION DIAGNOSIS:   Severe Malnutrition related to chronic illness (multiple sclerosis) as evidenced by moderate fat depletion,moderate muscle depletion,severe fat depletion,severe muscle depletion.  GOAL:   Patient will meet greater than or equal to 90% of their needs  MONITOR:   PO intake,Supplement acceptance,Labs,Weight trends  REASON FOR ASSESSMENT:   Malnutrition Screening Tool  ASSESSMENT:   65 year old female with medical history of multiple sclerosis on aubagio, osteoporosis, and ongoing tobacco abuse (3/4 ppd). She presented to the ED with generalized weakness and muscle spasms in her feet/calves and sometimes her hands for the past several days, subjective fever, and cough. She stated she has been without some of her medications for 2 months (unclear which medications).  Patient had returned from MRI shortly before RD visit. RN had reported to other staff, which RD overheard, that patient was given Ativan prior to going to MRI and was still drowsy from medication.   Patient was needing to use the bathroom at the time of visit. Unable to obtain any nutrition-related information from patient at the time, including if she requires feeding assistance. Alerted RN to patient's need.   No intakes have been documented since admission.  Weight yesterday was 92 lb and weight on 11/01/19 was 100 lb. This indicates 8 lb weight loss (8% body weight) in the past 5 months; not significant for time frame. Unsure  if weight loss occurred more acutely.   SLP was unable to work with patient this AM as patient was going to MRI. Will monitor for SLP recommendations.   Per notes: - generalized weakness, myalgias/muscle spasms - acute hypoxic respiratory failure likely due to CAP/aspiration PNA and volume overload - hx of MS without acute flare   Labs reviewed; K: 2.9 mmol/l, Ca: 8.7 mg/dl. Medications reviewed; 20 mg IV lasix x1 dose 3/30, 3 g IV Mg sulfate x1 run 3/29, 1 g IV Mg sulfate x1 run 3/29, 10 mEq IV KCl x4 runs 3/29, 40 mEq Klor-Con x2 doses 3/30.    NUTRITION - FOCUSED PHYSICAL EXAM:  Flowsheet Row Most Recent Value  Orbital Region Moderate depletion  Upper Arm Region Severe depletion  Thoracic and Lumbar Region Unable to assess  Buccal Region Severe depletion  Temple Region Severe depletion  Clavicle Bone Region Severe depletion  Clavicle and Acromion Bone Region Severe depletion  Scapular Bone Region Severe depletion  Dorsal Hand Moderate depletion  Patellar Region Unable to assess  Anterior Thigh Region Unable to assess  Posterior Calf Region Unable to assess  Edema (RD Assessment) Unable to assess  Hair Reviewed  Eyes Reviewed  Mouth Reviewed  Skin Reviewed  Nails Reviewed       Diet Order:   Diet Order            Diet Heart Room service appropriate? Yes; Fluid consistency: Thin  Diet effective now                 EDUCATION NEEDS:   Not appropriate for education at this time  Skin:  Skin Assessment: Reviewed  RN Assessment  Last BM:  PTA/unknown  Height:   Ht Readings from Last 1 Encounters:  05/28/20 5\' 3"  (1.6 m)    Weight:   Wt Readings from Last 1 Encounters:  05/28/20 41.7 kg     Estimated Nutritional Needs:  Kcal:  1400-1600 kcal Protein:  70-85 grams Fluid:  >/= 1.6 L/day     05/30/20, MS, RD, LDN, CNSC Inpatient Clinical Dietitian RD pager # available in AMION  After hours/weekend pager # available in [facility]

## 2020-05-29 NOTE — Progress Notes (Signed)
  Echocardiogram 2D Echocardiogram has been performed.  Shawna Smith 05/29/2020, 3:14 PM

## 2020-05-29 NOTE — TOC Initial Note (Signed)
Transition of Care [facility]) - Initial/Assessment Note    Patient Details  Name: Shawna Smith MRN: [id number] Date of Birth: 05-29-1955  Transition of Care [facility]) CM/SW Contact:    Golda Acre, RN Phone Number: 05/29/2020, 7:43 AM  Clinical Narrative:                 65 year old female with a past medical history of multiple sclerosis on aubagio, osteoporosis, tobacco abuse (3/4 ppd) who presented to [facility] ED with generalized weakness and muscle spasms in her feet/calves and sometimes her hands for the past several days.  She went to [facility] yesterday but was never seen and left. She states she has been without some of her medications for two months but unclear which ones. Reports subjective fever and productive cough.  No vision changes, unilateral weakness or strokelike symptoms.  No abdominal pain, chest pain.    ED Course: Afebrile, hypertensive, hypoxic (SpO2 Down to 60% on room air) placed on HFNC 15 L/min. Notable Labs: Sodium 134, K2.2, chloride 91, glucose 152, BUN 8, creatinine 0.72, Mg 1.3, WBC 7.0, Hb 15.3, platelets 259, COVID-19 and flu negative. Notable Imaging: CXR-mild right midlung subsegmental atelectasis or infiltrate.  CTA chest-bilateral small pleural effusions and interstitial thickening concerning for pulmonary edema, no PE. Patient received Albuterol, potassium IV and p.o. magnesium sulfate.  EKG: Sinus rhythm with prolonged QTc 625 ms.  Patient was transferred to [facility] for further management.  PLAN: to return to home with self care.  Expected Discharge Plan: Home/Self Care Barriers to Discharge: Continued Medical Work up   Patient Goals and CMS Choice Patient states their goals for this hospitalization and ongoing recovery are:: to go home CMS Medicare.gov Compare Post Acute Care list provided to:: Patient Choice offered to / list presented to : Patient  Expected Discharge Plan and Services Expected Discharge Plan: Home/Self Care   Discharge Planning Services: CM  Consult   Living arrangements for the past 2 months: Single Family Home                                      Prior Living Arrangements/Services Living arrangements for the past 2 months: Single Family Home Lives with:: Self Patient language and need for interpreter reviewed:: Yes Do you feel safe going back to the place where you live?: Yes      Need for Family Participation in Patient Care: No (Comment) Care giver support system in place?: No (comment)   Criminal Activity/Legal Involvement Pertinent to Current Situation/Hospitalization: No - Comment as needed  Activities of Daily Living Home Assistive Devices/Equipment: Eyeglasses ADL Screening (condition at time of admission) Patient's cognitive ability adequate to safely complete daily activities?: No Is the patient deaf or have difficulty hearing?: No Does the patient have difficulty seeing, even when wearing glasses/contacts?: No Does the patient have difficulty concentrating, remembering, or making decisions?: Yes (words jumbled lately) Patient able to express need for assistance with ADLs?: Yes Does the patient have difficulty dressing or bathing?: Yes Independently performs ADLs?: No Communication: Independent Dressing (OT): Needs assistance Is this a change from baseline?: Pre-admission baseline Grooming: Independent Feeding: Independent Bathing: Needs assistance Is this a change from baseline?: Pre-admission baseline Toileting: Needs assistance Is this a change from baseline?: Pre-admission baseline In/Out Bed: Needs assistance Is this a change from baseline?: Pre-admission baseline Walks in Home: Needs assistance Is this a change from baseline?: Pre-admission baseline Does the patient  have difficulty walking or climbing stairs?: Yes Weakness of Legs: Both Weakness of Arms/Hands: Both  Permission Sought/Granted                  Emotional Assessment Appearance:: Appears stated  age Attitude/Demeanor/Rapport: Engaged Affect (typically observed): Calm Orientation: : Oriented to Place,Oriented to Self,Oriented to  Time,Oriented to Situation Alcohol / Substance Use: Not Applicable Psych Involvement: No (comment)  Admission diagnosis:  Hypokalemia [E87.6] Hypomagnesemia [E83.42] Weakness [R53.1] Muscle spasm [M62.838] Acute respiratory failure with hypoxia (HCC) [J96.01] Generalized weakness [R53.1] QT prolongation [R94.31] Aspiration pneumonia of right middle lobe, unspecified aspiration pneumonia type (HCC) [J69.0] Patient Active Problem List   Diagnosis Date Noted  . Generalized weakness 05/28/2020  . Acute respiratory failure with hypoxia (HCC) 05/28/2020  . Multiple sclerosis (HCC) 05/28/2020  . Hypokalemia 05/28/2020  . Hypomagnesemia 05/28/2020  . Prolonged QT interval 05/28/2020  . Tobacco use 05/28/2020  . Hand fracture, left 06/18/2018  . Metacarpal bone fracture 06/18/2018   PCP:  Ardyth Gal, MD Pharmacy:   [facility] [phone] - [place], [place] - [address] AT [facility] [address] [place] [address] Phone: [phone] Fax: [phone]     Social Determinants of Health (SDOH) Interventions    Readmission Risk Interventions No flowsheet data found.

## 2020-05-30 DIAGNOSIS — R9431 Abnormal electrocardiogram [ECG] [EKG]: Secondary | ICD-10-CM

## 2020-05-30 LAB — CBC
HCT: 46 % (ref 36.0–46.0)
Hemoglobin: 16.2 g/dL — ABNORMAL HIGH (ref 12.0–15.0)
MCH: 36.5 pg — ABNORMAL HIGH (ref 26.0–34.0)
MCHC: 35.2 g/dL (ref 30.0–36.0)
MCV: 103.6 fL — ABNORMAL HIGH (ref 80.0–100.0)
Platelets: 240 10*3/uL (ref 150–400)
RBC: 4.44 MIL/uL (ref 3.87–5.11)
RDW: 14.6 % (ref 11.5–15.5)
WBC: 6.3 10*3/uL (ref 4.0–10.5)
nRBC: 0 % (ref 0.0–0.2)

## 2020-05-30 LAB — BASIC METABOLIC PANEL
Anion gap: 13 (ref 5–15)
BUN: 6 mg/dL — ABNORMAL LOW (ref 8–23)
CO2: 25 mmol/L (ref 22–32)
Calcium: 9.3 mg/dL (ref 8.9–10.3)
Chloride: 98 mmol/L (ref 98–111)
Creatinine, Ser: 0.64 mg/dL (ref 0.44–1.00)
GFR, Estimated: >60 mL/min (ref >60)
Glucose, Bld: 114 mg/dL — ABNORMAL HIGH (ref 70–99)
Potassium: 3.4 mmol/L — ABNORMAL LOW (ref 3.5–5.1)
Sodium: 136 mmol/L (ref 135–145)

## 2020-05-30 MED ORDER — LOSARTAN POTASSIUM-HCTZ 50-12.5 MG PO TABS: 1.0000 | ORAL_TABLET | Freq: Every day | ORAL | Status: DC

## 2020-05-30 MED ORDER — HYDROCHLOROTHIAZIDE 12.5 MG PO CAPS
12.5000 mg | ORAL_CAPSULE | Freq: Every day | ORAL | Status: DC
  Administered 2020-05-30: 12.5 mg via ORAL
  Filled 2020-05-30: qty 1

## 2020-05-30 MED ORDER — LOSARTAN POTASSIUM 50 MG PO TABS
50.0000 mg | ORAL_TABLET | Freq: Every day | ORAL | Status: DC
  Administered 2020-05-30: 50 mg via ORAL
  Filled 2020-05-30: qty 1

## 2020-05-30 MED ORDER — DOXYCYCLINE HYCLATE 100 MG PO CAPS: 100.0000 mg | ORAL_CAPSULE | Freq: Two times a day (BID) | ORAL | 0 refills | Status: AC

## 2020-05-30 NOTE — Evaluation (Signed)
Physical Therapy Evaluation Patient Details Name: Shawna Smith MRN: [id number] DOB: [birth year] Today's Date: 05/30/2020   History of Present Illness  Patient is a 65 y.o. female who presented to the ED with complaintas of general weakness and spasms in calves, feet, and hands, found to have low potassium and magnesium levels. PMH significant for MS, osteoporosis, and metacarpal ORIF (2020).  Clinical Impression  Pt is a 64y.o. female with above HPI. Pt on 1.5L [place] at start of session. Pt reports that she is modified independent with use of RW and SPC for mobility at baseline with history of falls. Pt required MIN guard-supervision with cues for safe hand placement for sit to stand transfer. Pt required MIN guard-supervision for safety with ambulation 256ft with mild drifting Lt/Rt with RW. Pt able to negotiate obstacles without cuing, no LOB observed. Therapist trialed pt on RA during ambulation with 92% lowest O2 reading. Pt placed back on [place] at EOS with O2 sat 96%. Pt will have assistance from her son and sister upon discharge and has home health aide regularly. Pt reports she is at baseline mobility level, but reports that she would like continued skilled PT at home with history of falls and to improve strength. Pt will benefit from skilled PT to increase independence and safety with mobility. No further acute PT needs identified, therapy to sign off.      Follow Up Recommendations Home health PT (pt prefers [facility] at home)    Equipment Recommendations  None recommended by PT (pt owns RW)    Recommendations for Other Services       Precautions / Restrictions Precautions Precautions: Fall Restrictions Weight Bearing Restrictions: No      Mobility  Bed Mobility Overal bed mobility: Needs Assistance Bed Mobility: Supine to Sit     Supine to sit: Modified independent (Device/Increase time)     General bed mobility comments: pt with use of bed rails and B UEs to scoot to EOB     Transfers Overall transfer level: Needs assistance Equipment used: Rolling walker (2 wheeled) Transfers: Sit to/from Stand Sit to Stand: Min guard;Supervision         General transfer comment: x3 from EOB and toilet; MIN guard-supervision for safety with cues for safe hand placement  Ambulation/Gait Ambulation/Gait assistance: Min guard;Supervision Gait Distance (Feet): 220 Feet Assistive device: Rolling walker (2 wheeled) Gait Pattern/deviations: Step-through pattern;Drifts right/left Gait velocity: fair   General Gait Details: Pt on 1.5L at start of session. MIN guard-supervision for safety with no overt LOB observed. Pt displayed mild drifitng Lt/Rt with use of RW but able to avoid obstacles in hallway with no cuing. Therapist trialed pt on RA lowest O2 sat 92%. Pt placed back on 1.5L at EOS, O2 sat at 97%.  Pt has 1 STE home, declined trialing step stating that she feels comfortable that she can do it and her son will be available to assist upon discharge getting into home. Max HR during session 124bpm. Pt states that she is at baseline mobility and feels that she does not need contiued acute PT, but would like to have HHPT as she has in the past to work on strength and reduce fall risk.  Stairs            Wheelchair Mobility    Modified Rankin (Stroke Patients Only)       Balance Overall balance assessment: Needs assistance Sitting-balance support: Feet supported Sitting balance-Leahy Scale: Good     Standing balance support: Bilateral upper  extremity supported;Single extremity supported;During functional activity Standing balance-Leahy Scale: Fair                               Pertinent Vitals/Pain Pain Assessment: No/denies pain Faces Pain Scale: No hurt    Home Living Family/patient expects to be discharged to:: Private residence Living Arrangements: Parent;Other relatives Available Help at Discharge: Family Type of Home: House Home  Access: Stairs to enter Entrance Stairs-Rails: None Entrance Stairs-Number of Steps: 1 Home Layout: One level Home Equipment: Walker - 2 wheels;Cane - single point;Bedside commode;Shower seat;Grab bars - tub/shower;Grab bars - toilet;Wheelchair - Pharmacist, hospital Comments: pt lives with her mom and sister and son come to check in everyday. Pt reports that she has an in-home aide 6hrs/day mon-sat, that assist with cooking meals and getting dressed.    Prior Function Level of Independence: Independent with assistive device(s)         Comments: use of RW and SPC. Pt reports history fo falls when she moves around without her assistive device.     Hand Dominance   Dominant Hand: Right    Extremity/Trunk Assessment   Upper Extremity Assessment Upper Extremity Assessment: Overall WFL for tasks assessed    Lower Extremity Assessment Lower Extremity Assessment: Generalized weakness    Cervical / Trunk Assessment Cervical / Trunk Assessment: Normal  Communication   Communication: No difficulties  Cognition Arousal/Alertness: Awake/alert Behavior During Therapy: WFL for tasks assessed/performed                                          General Comments      Exercises     Assessment/Plan    PT Assessment Patent does not need any further PT services  PT Problem List Decreased strength;Decreased activity tolerance;Decreased balance;Decreased mobility       PT Treatment Interventions DME instruction;Gait training;Stair training;Functional mobility training;Therapeutic activities;Therapeutic exercise;Balance training;Patient/family education    PT Goals (Current goals can be found in the Care Plan section)  Acute Rehab PT Goals Patient Stated Goal: get stronger PT Goal Formulation: With patient Time For Goal Achievement: 06/13/20 Potential to Achieve Goals: Fair    Frequency Other (Comment) (1x Eval)   Barriers to discharge         Co-evaluation               AM-PAC PT "6 Clicks" Mobility  Outcome Measure Help needed turning from your back to your side while in a flat bed without using bedrails?: None Help needed moving from lying on your back to sitting on the side of a flat bed without using bedrails?: A Little Help needed moving to and from a bed to a chair (including a wheelchair)?: A Little Help needed standing up from a chair using your arms (e.g., wheelchair or bedside chair)?: A Little Help needed to walk in hospital room?: A Little Help needed climbing 3-5 steps with a railing? : A Little 6 Click Score: 19    End of Session Equipment Utilized During Treatment: Gait belt Activity Tolerance: Patient tolerated treatment well Patient left: in bed;with call bell/phone within reach;with bed alarm set Nurse Communication: Mobility status (pt O2 sats on RA during session) PT Visit Diagnosis: Unsteadiness on feet (R26.81);Repeated falls (R29.6);Muscle weakness (generalized) (M62.81)    Time: 2706-2376 PT Time Calculation (min) (ACUTE ONLY): 34 min  Charges:              Loyal Gambler, SPT  Acute rehab   Loyal Gambler 05/30/2020, 1:10 PM

## 2020-05-30 NOTE — Discharge Summary (Signed)
Physician Discharge Summary  Shawna Smith ZOX:[id number] DOB: May 27, 1955 DOA: 05/28/2020  PCP: Ardyth Gal, MD  Admit date: 05/28/2020 Discharge date: 05/30/2020  Admitted From: Home Disposition: Home   Recommendations for Outpatient Follow-up:  1. Follow up with PCP in 1-2 weeks 2. Please obtain BMP/CBC in one week 3. Follow up with neurology per routine.   Home Health: PT Equipment/Devices: None new (has RW and cane) Discharge Condition: Stable CODE STATUS: Full Diet recommendation: Heart healthy  Brief/Interim Summary: Shawna Smith is a 65 y.o. female with a history of MS on aubagio, tobacco use, and osteoporosis who presented to [facility] 3/29 with weakness, muscle spasms and subjective fever. On arrival she was afebrile, hypertensive, hypoxic requiring 15LPM, severely hypokalemic with right midlung opacities on CXR further defined to be consistent with pulmonary edema with bilateral small pleural effusions on CTA chest (no PE). Patient receivedalbuterol, potassium, and magnesium supplementation, and antibiotics.EKG: Sinus rhythm with prolonged QTc625 ms. Patient was transferred to [facility] for further management. Lasix was given as well as doxycycline with improvement, resolution of hypoxia and normalization of respiratory effort. MRI brain and C spine did not show any active demyelinating lesions and the patient's weakness has resolved at time of discharge. See below for further details of admission.  Discharge Diagnoses:  Principal Problem:   Acute respiratory failure with hypoxia (HCC) Active Problems:   Generalized weakness   Multiple sclerosis (HCC)   Hypokalemia   Hypomagnesemia   Prolonged QT interval   Tobacco use   Protein-calorie malnutrition, severe  Acute hypoxic respiratory failure due to CAP and acute pulmonary edema:  - BNP grossly elevated. Echocardiogram very reassuring without systolic or diastolic LV dysfunction. No proteinuriaGiven lasix while admitted, appears  euvolemic at DC.  - Continue doxycycline (abx limited by allergies, prolonged QT). No leukocytosis.   MS: Dx about 10 years ago, follows with neurology in [facility].  - D/w neurology, Dr. Derry Lory who recommends MRI brain, C-spine w/ and w/o contrast which showed no active demyelinating lesions. Symptoms have improved with treatment of infection.  - Continue home aubagio at discharge.  Acute urinary retention: ?If Sx of MS flare - Attempt BSC which may improve ability to void.  - I/O prn - D/w RN need to send UA  Hypokalemia: Significant and refractory to supplementation.  - Augment supplementation especially in light of repeat loop diuretic dosing.  Hypomagnesemia:  - Supplemented    HTN:  - Restart home medications  Prolonged QTc: Likely a product of severe hypokalemia as it has resolved. . - Avoid provocative agents as much as possible.   Insomnia: Given trazodone while inpatient  Severe protein calorie malnutrition:  - Supplement protein as able  Discharge Instructions  Allergies as of 05/30/2020      Reactions   Ace Inhibitors Swelling   Other reaction(s): Other (see comments)   Penicillins    Sulfa Antibiotics       Medication List    TAKE these medications   albuterol 108 (90 Base) MCG/ACT inhaler Commonly known as: VENTOLIN HFA Inhale 2 puffs into the lungs every 4 (four) hours as needed for wheezing.   amitriptyline 50 MG tablet Commonly known as: ELAVIL Take 1 tablet by mouth at bedtime.   amLODipine 10 MG tablet Commonly known as: NORVASC Take 10 mg by mouth daily.   ascorbic Acid 500 MG Cpcr Commonly known as: VITAMIN C Take 500 mg by mouth daily.   aspirin 81 MG EC tablet Take 81 mg by mouth daily.  Aubagio 14 MG Tabs Generic drug: Teriflunomide Take 14 mg by mouth daily.   Calcium Carb-Ergocalciferol 500-200 MG-UNIT Tabs Take 1 tablet by mouth daily.   cholecalciferol 25 MCG (1000 UNIT) tablet Commonly known as: VITAMIN  D3 Take 1,000 Units by mouth daily.   doxycycline 100 MG capsule Commonly known as: VIBRAMYCIN Take 1 capsule (100 mg total) by mouth 2 (two) times daily for 5 days. What changed: medication strength   Ensure Plus Liqd Take 237 mLs by mouth 3 (three) times daily between meals.   EPINEPHrine 0.3 mg/0.3 mL Soaj injection Commonly known as: EPI-PEN Inject 0.3 mg into the muscle once as needed for anaphylaxis.   Fish Oil 1000 MG Caps Take 1,000 mg by mouth daily.   gabapentin 300 MG capsule Commonly known as: NEURONTIN Take 900 mg by mouth 2 (two) times daily.   losartan-hydrochlorothiazide 50-12.5 MG tablet Commonly known as: HYZAAR Take 1 tablet by mouth daily.   methocarbamol 500 MG tablet Commonly known as: ROBAXIN Take 1 tablet (500 mg total) by mouth every 6 (six) hours as needed for muscle spasms.   metoprolol succinate 100 MG 24 hr tablet Commonly known as: TOPROL-XL Take by mouth.   OXcarbazepine 150 MG tablet Commonly known as: TRILEPTAL Take 150 mg by mouth 2 (two) times daily.   Oxycodone HCl 10 MG Tabs Take 0.5 tablets (5 mg total) by mouth every 4 (four) hours as needed for severe pain (pain score 7-10).   traMADol 50 MG tablet Commonly known as: ULTRAM Take 50 mg by mouth 4 (four) times daily as needed.   traZODone 50 MG tablet Commonly known as: DESYREL Take 50-100 mg by mouth at bedtime as needed.       Follow-up Information    Ardyth Gal, MD. Schedule an appointment as soon as possible for a visit.   Specialty: Internal Medicine Contact information: [address] [place] [address] [phone]              Allergies  Allergen Reactions  . Ace Inhibitors Swelling    Other reaction(s): Other (see comments)  . Penicillins   . Sulfa Antibiotics     Consultations:  Neurology by phone only  Procedures/Studies: CT Angio Chest PE W and/or Wo Contrast  Result Date: 05/28/2020 CLINICAL DATA:  Shortness of breath, no chest  pain EXAM: CT ANGIOGRAPHY CHEST WITH CONTRAST TECHNIQUE: Multidetector CT imaging of the chest was performed using the standard protocol during bolus administration of intravenous contrast. Multiplanar CT image reconstructions and MIPs were obtained to evaluate the vascular anatomy. CONTRAST:  OMNIPAQUE IOHEXOL 350 MG/ML SOLN COMPARISON:  None. FINDINGS: Cardiovascular: Satisfactory opacification of the pulmonary arteries to the segmental level. No evidence of pulmonary embolism. Normal heart size. No pericardial effusion. Thoracic aortic atherosclerosis. Coronary artery atherosclerosis. Mediastinum/Nodes: No enlarged mediastinal, hilar, or axillary lymph nodes. Thyroid gland, trachea, and esophagus demonstrate no significant findings. Lungs/Pleura: Bilateral small pleural effusions. Bilateral interstitial thickening. No focal consolidation. No pneumothorax. Upper Abdomen: No acute abnormality. Musculoskeletal: No acute osseous abnormality. No aggressive osseous lesion. Review of the MIP images confirms the above findings. IMPRESSION: 1. No evidence of pulmonary embolus. 2. Bilateral small pleural effusions and interstitial thickening concerning for pulmonary edema. 3. Aortic Atherosclerosis (ICD10-I70.0). Electronically Signed   By: Elige Ko   On: 05/28/2020 16:11   MR BRAIN W WO CONTRAST  Result Date: 05/29/2020 CLINICAL DATA:  Multiple sclerosis, new event.  Weakness. EXAM: MRI HEAD WITHOUT AND WITH CONTRAST MRI CERVICAL SPINE  WITHOUT AND WITH CONTRAST TECHNIQUE: Multiplanar, multiecho pulse sequences of the brain and surrounding structures, and cervical spine, to include the craniocervical junction and cervicothoracic junction, were obtained without and with intravenous contrast. CONTRAST:  73mL GADAVIST GADOBUTROL 1 MMOL/ML IV SOLN COMPARISON:  Head CT 11/01/2019 and head MRI 06/22/2017 FINDINGS: MRI HEAD FINDINGS Multiple sequences are moderately motion degraded. Brain: There is no evidence of an  acute infarct, intracranial hemorrhage, mass, midline shift, or extra-axial fluid collection. The ventricles are normal in size. Patchy and confluent T2 hyperintensities in the cerebral white matter bilaterally predominantly affect the periventricular white matter, particularly in the periatrial regions, and have mildly progressed from the prior MRI. A small region of cortical and subcortical encephalomalacia in the medial left occipital lobe is unchanged. Abnormal T2 hyperintensity bilaterally in the pons/anterior aspects of the middle cerebellar peduncles is unchanged. No abnormal enhancement or restricted diffusion is identified. Vascular: Major intracranial vascular flow voids are grossly preserved. Skull and upper cervical spine: No suspicious marrow lesion. Sinuses/Orbits: Grossly unremarkable orbits. Paranasal sinuses and mastoid air cells are clear. Other: None. MRI CERVICAL SPINE FINDINGS The study is moderately to severely motion degraded despite repeated imaging attempts. Alignment: Straightening of the normal cervical lordosis. Trace retrolisthesis of C3 on C4 and C4 on C5. Vertebrae: No fracture, suspicious osseous lesion, significant marrow edema, or evidence of discitis. Hemangioma in the T2 vertebral body. Moderate disc space narrowing from C3-4 to C6-7 with associated degenerative endplate changes. Cord: The spinal cord is poorly evaluated due to motion. There appears to be a subcentimeter focus of hyperintensity in the dorsal midline of the cord at C2 on both the sagittal STIR and axial T2 gradient echo sequences. No abnormal enhancement is identified. Posterior Fossa, vertebral arteries, paraspinal tissues: Minimal prevertebral soft tissue swelling in the upper cervical spine. Grossly preserved vertebral artery flow voids. Disc levels: Detailed assessment of degenerative changes is limited by motion. Disc bulging and spurring result in mild spinal stenosis at C3-4 and C4-5. There is multilevel  neural foraminal stenosis which appears severe bilaterally at C4-5 and moderate to severe at C5-6. IMPRESSION: 1. Motion degraded head MRI without evidence of an acute intracranial abnormality. 2. Mild progression of white matter disease since 2019 with clinical history of multiple sclerosis. No evidence of active demyelination. 3. Severely motion degraded cervical spine MRI with a suspected small chronic demyelinating plaque at C2. 4. Multilevel disc degeneration resulting in up to mild spinal stenosis and severe neural foraminal stenosis. Electronically Signed   By: Sebastian Ache M.D.   On: 05/29/2020 13:15   MR CERVICAL SPINE W WO CONTRAST  Result Date: 05/29/2020 CLINICAL DATA:  Multiple sclerosis, new event.  Weakness. EXAM: MRI HEAD WITHOUT AND WITH CONTRAST MRI CERVICAL SPINE WITHOUT AND WITH CONTRAST TECHNIQUE: Multiplanar, multiecho pulse sequences of the brain and surrounding structures, and cervical spine, to include the craniocervical junction and cervicothoracic junction, were obtained without and with intravenous contrast. CONTRAST:  71mL GADAVIST GADOBUTROL 1 MMOL/ML IV SOLN COMPARISON:  Head CT 11/01/2019 and head MRI 06/22/2017 FINDINGS: MRI HEAD FINDINGS Multiple sequences are moderately motion degraded. Brain: There is no evidence of an acute infarct, intracranial hemorrhage, mass, midline shift, or extra-axial fluid collection. The ventricles are normal in size. Patchy and confluent T2 hyperintensities in the cerebral white matter bilaterally predominantly affect the periventricular white matter, particularly in the periatrial regions, and have mildly progressed from the prior MRI. A small region of cortical and subcortical encephalomalacia in the medial left occipital lobe  is unchanged. Abnormal T2 hyperintensity bilaterally in the pons/anterior aspects of the middle cerebellar peduncles is unchanged. No abnormal enhancement or restricted diffusion is identified. Vascular: Major intracranial  vascular flow voids are grossly preserved. Skull and upper cervical spine: No suspicious marrow lesion. Sinuses/Orbits: Grossly unremarkable orbits. Paranasal sinuses and mastoid air cells are clear. Other: None. MRI CERVICAL SPINE FINDINGS The study is moderately to severely motion degraded despite repeated imaging attempts. Alignment: Straightening of the normal cervical lordosis. Trace retrolisthesis of C3 on C4 and C4 on C5. Vertebrae: No fracture, suspicious osseous lesion, significant marrow edema, or evidence of discitis. Hemangioma in the T2 vertebral body. Moderate disc space narrowing from C3-4 to C6-7 with associated degenerative endplate changes. Cord: The spinal cord is poorly evaluated due to motion. There appears to be a subcentimeter focus of hyperintensity in the dorsal midline of the cord at C2 on both the sagittal STIR and axial T2 gradient echo sequences. No abnormal enhancement is identified. Posterior Fossa, vertebral arteries, paraspinal tissues: Minimal prevertebral soft tissue swelling in the upper cervical spine. Grossly preserved vertebral artery flow voids. Disc levels: Detailed assessment of degenerative changes is limited by motion. Disc bulging and spurring result in mild spinal stenosis at C3-4 and C4-5. There is multilevel neural foraminal stenosis which appears severe bilaterally at C4-5 and moderate to severe at C5-6. IMPRESSION: 1. Motion degraded head MRI without evidence of an acute intracranial abnormality. 2. Mild progression of white matter disease since 2019 with clinical history of multiple sclerosis. No evidence of active demyelination. 3. Severely motion degraded cervical spine MRI with a suspected small chronic demyelinating plaque at C2. 4. Multilevel disc degeneration resulting in up to mild spinal stenosis and severe neural foraminal stenosis. Electronically Signed   By: Sebastian Ache M.D.   On: 05/29/2020 13:15   DG Chest Portable 1 View  Result Date:  05/28/2020 CLINICAL DATA:  Weakness. EXAM: PORTABLE CHEST 1 VIEW COMPARISON:  May 31, 2019. FINDINGS: The heart size and mediastinal contours are within normal limits. No pneumothorax or pleural effusion is noted. Left lung is clear. Mild right midlung subsegmental atelectasis or infiltrate is noted. The visualized skeletal structures are unremarkable. IMPRESSION: Mild right midlung subsegmental atelectasis or infiltrate. Aortic Atherosclerosis (ICD10-I70.0). Electronically Signed   By: Lupita Raider M.D.   On: 05/28/2020 12:44   ECHOCARDIOGRAM COMPLETE  Result Date: 05/29/2020    ECHOCARDIOGRAM REPORT   Patient Name:   Shawna Smith Date of Exam: 05/29/2020 Medical Rec #:  [id number]  Height:       63.0 in Accession #:    [id number] Weight:       92.0 lb Date of Birth:  1955-09-18 BSA:          1.390 m Patient Age:    64 years   BP:           207/88 mmHg Patient Gender: F          HR:           85 bpm. Exam Location:  Inpatient Procedure: 2D Echo, Cardiac Doppler and Color Doppler Indications:    Acute Respiratory Distress R06.03  History:        Patient has no prior history of Echocardiogram examinations.                 Multiple Sclerosis.  Sonographer:    Elmarie Shiley Dance Referring Phys: [id number] IMPRESSIONS  1. Left ventricular ejection fraction, by estimation, is 50 to 55%. The  left ventricle has low normal function. The left ventricle has no regional wall motion abnormalities. Left ventricular diastolic parameters were normal.  2. Right ventricular systolic function is normal. The right ventricular size is normal. There is mildly elevated pulmonary artery systolic pressure.  3. The mitral valve is normal in structure. Mild mitral valve regurgitation. No evidence of mitral stenosis.  4. The aortic valve is tricuspid. Aortic valve regurgitation is trivial. No aortic stenosis is present.  5. The inferior vena cava is normal in size with greater than 50% respiratory variability, suggesting right atrial  pressure of 3 mmHg. FINDINGS  Left Ventricle: Left ventricular ejection fraction, by estimation, is 50 to 55%. The left ventricle has low normal function. The left ventricle has no regional wall motion abnormalities. The left ventricular internal cavity size was normal in size. There is no left ventricular hypertrophy. Left ventricular diastolic parameters were normal. Right Ventricle: The right ventricular size is normal. No increase in right ventricular wall thickness. Right ventricular systolic function is normal. There is mildly elevated pulmonary artery systolic pressure. The tricuspid regurgitant velocity is 2.88  m/s, and with an assumed right atrial pressure of 3 mmHg, the estimated right ventricular systolic pressure is 36.2 mmHg. Left Atrium: Left atrial size was normal in size. Right Atrium: Right atrial size was normal in size. Pericardium: There is no evidence of pericardial effusion. Mitral Valve: The mitral valve is normal in structure. There is mild thickening of the mitral valve leaflet(s). Mild mitral valve regurgitation. No evidence of mitral valve stenosis. Tricuspid Valve: The tricuspid valve is normal in structure. Tricuspid valve regurgitation is mild . No evidence of tricuspid stenosis. Aortic Valve: The aortic valve is tricuspid. Aortic valve regurgitation is trivial. No aortic stenosis is present. Pulmonic Valve: The pulmonic valve was normal in structure. Pulmonic valve regurgitation is not visualized. No evidence of pulmonic stenosis. Aorta: The aortic root is normal in size and structure. Venous: The inferior vena cava is normal in size with greater than 50% respiratory variability, suggesting right atrial pressure of 3 mmHg. IAS/Shunts: No atrial level shunt detected by color flow Doppler.  LEFT VENTRICLE PLAX 2D LVIDd:         4.44 cm  Diastology LVIDs:         3.93 cm  LV e' medial:    4.46 cm/s LV PW:         0.88 cm  LV E/e' medial:  20.3 LV IVS:        0.98 cm  LV e' lateral:    6.16 cm/s LVOT diam:     2.00 cm  LV E/e' lateral: 14.7 LV SV:         55 LV SV Index:   39 LVOT Area:     3.14 cm  RIGHT VENTRICLE             IVC RV Basal diam:  3.23 cm     IVC diam: 1.45 cm RV Mid diam:    2.09 cm RV S prime:     10.30 cm/s TAPSE (M-mode): 1.8 cm LEFT ATRIUM         Index LA diam:    3.60 cm 2.59 cm/m  AORTIC VALVE LVOT Vmax:   91.85 cm/s LVOT Vmean:  62.150 cm/s LVOT VTI:    0.174 m  AORTA Ao Root diam: 3.00 cm Ao Asc diam:  2.90 cm MITRAL VALVE               TRICUSPID VALVE  MV Area (PHT): 4.89 cm    TR Peak grad:   33.2 mmHg MV Decel Time: 155 msec    TR Vmax:        288.00 cm/s MV E velocity: 90.60 cm/s MV A velocity: 68.60 cm/s  SHUNTS MV E/A ratio:  1.32        Systemic VTI:  0.17 m                            Systemic Diam: 2.00 cm Charlton Haws MD Electronically signed by Charlton Haws MD Signature Date/Time: 05/29/2020/4:05:34 PM    Final      Subjective: Feels well, no dyspnea even with exertion. Weakness resolved. Eager to go home.   Discharge Exam: Vitals:   05/30/20 1400 05/30/20 1436  BP: (!) 172/84   Pulse: 99   Resp: 16   Temp:  97.8 F (36.6 C)  SpO2: 94%    General: Pt is alert, awake, not in acute distress Cardiovascular: RRR, S1/S2 +, no rubs, no gallops Respiratory: CTA bilaterally, no wheezing, no rhonchi Abdominal: Soft, NT, ND, bowel sounds + Extremities: No edema, no cyanosis  Labs: BNP (last 3 results) Recent Labs    05/28/20 2035  BNP 1,428.1*   Basic Metabolic Panel: Recent Labs  Lab 05/27/20 1859 05/28/20 1158 05/28/20 2035 05/29/20 0234 05/30/20 0203  NA 139 134* 136 137 136  K 2.6* 2.2* 4.0 2.9* 3.4*  CL 99 91* 101 102 98  CO2 30 30 26 25 25   GLUCOSE 82 152* 123* 113* 114*  BUN 7* 8 8 8  6*  CREATININE 0.72 0.72 0.79 0.70 0.64  CALCIUM 9.0 9.0 8.6* 8.7* 9.3  MG  --  1.3* 2.5* 2.4  --    Liver Function Tests: No results for input(s): AST, ALT, ALKPHOS, BILITOT, PROT, ALBUMIN in the last 168 hours. No results for  input(s): LIPASE, AMYLASE in the last 168 hours. No results for input(s): AMMONIA in the last 168 hours. CBC: Recent Labs  Lab 05/27/20 1900 05/28/20 1158 05/29/20 0234 05/30/20 0203  WBC 4.7 7.0 6.9 6.3  NEUTROABS  --  4.7  --   --   HGB 14.7 15.3* 14.8 16.2*  HCT 40.7 43.7 41.9 46.0  MCV 106.0* 104.8* 104.5* 103.6*  PLT 211 259 229 240   Cardiac Enzymes: No results for input(s): CKTOTAL, CKMB, CKMBINDEX, TROPONINI in the last 168 hours. BNP: Invalid input(s): POCBNP CBG: No results for input(s): GLUCAP in the last 168 hours. D-Dimer No results for input(s): DDIMER in the last 72 hours. Hgb A1c No results for input(s): HGBA1C in the last 72 hours. Lipid Profile No results for input(s): CHOL, HDL, LDLCALC, TRIG, CHOLHDL, LDLDIRECT in the last 72 hours. Thyroid function studies No results for input(s): TSH, T4TOTAL, T3FREE, THYROIDAB in the last 72 hours.  Invalid input(s): FREET3 Anemia work up No results for input(s): VITAMINB12, FOLATE, FERRITIN, TIBC, IRON, RETICCTPCT in the last 72 hours. Urinalysis    Component Value Date/Time   COLORURINE STRAW (A) 05/29/2020 1630   APPEARANCEUR CLEAR 05/29/2020 1630   LABSPEC 1.005 05/29/2020 1630   PHURINE 8.0 05/29/2020 1630   GLUCOSEU NEGATIVE 05/29/2020 1630   HGBUR NEGATIVE 05/29/2020 1630   BILIRUBINUR NEGATIVE 05/29/2020 1630   KETONESUR NEGATIVE 05/29/2020 1630   PROTEINUR NEGATIVE 05/29/2020 1630   NITRITE NEGATIVE 05/29/2020 1630   LEUKOCYTESUR NEGATIVE 05/29/2020 1630    Microbiology Recent Results (from the past 240 hour(s))  Resp Panel  by RT-PCR (Flu A&B, Covid) Nasopharyngeal Swab     Status: None   Collection Time: 05/28/20  1:28 PM   Specimen: Nasopharyngeal Swab; Nasopharyngeal(NP) swabs in vial transport medium  Result Value Ref Range Status   SARS Coronavirus 2 by RT PCR NEGATIVE NEGATIVE Final    Comment: (NOTE) SARS-CoV-2 target nucleic acids are NOT DETECTED.  The SARS-CoV-2 RNA is generally  detectable in upper respiratory specimens during the acute phase of infection. The lowest concentration of SARS-CoV-2 viral copies this assay can detect is 138 copies/mL. A negative result does not preclude SARS-Cov-2 infection and should not be used as the sole basis for treatment or other patient management decisions. A negative result may occur with  improper specimen collection/handling, submission of specimen other than nasopharyngeal swab, presence of viral mutation(s) within the areas targeted by this assay, and inadequate number of viral copies(<138 copies/mL). A negative result must be combined with clinical observations, patient history, and epidemiological information. The expected result is Negative.  Fact Sheet for Patients:  BloggerCourse.com  Fact Sheet for Healthcare Providers:  SeriousBroker.it  This test is no t yet approved or cleared by the [place] FDA and  has been authorized for detection and/or diagnosis of SARS-CoV-2 by FDA under an Emergency Use Authorization (EUA). This EUA will remain  in effect (meaning this test can be used) for the duration of the COVID-19 declaration under Section 564(b)(1) of the Act, 21 U.S.C.section 360bbb-3(b)(1), unless the authorization is terminated  or revoked sooner.       Influenza A by PCR NEGATIVE NEGATIVE Final   Influenza B by PCR NEGATIVE NEGATIVE Final    Comment: (NOTE) The Xpert Xpress SARS-CoV-2/FLU/RSV plus assay is intended as an aid in the diagnosis of influenza from Nasopharyngeal swab specimens and should not be used as a sole basis for treatment. Nasal washings and aspirates are unacceptable for Xpert Xpress SARS-CoV-2/FLU/RSV testing.  Fact Sheet for Patients: BloggerCourse.com  Fact Sheet for Healthcare Providers: SeriousBroker.it  This test is not yet approved or cleared by the [place] FDA  and has been authorized for detection and/or diagnosis of SARS-CoV-2 by FDA under an Emergency Use Authorization (EUA). This EUA will remain in effect (meaning this test can be used) for the duration of the COVID-19 declaration under Section 564(b)(1) of the Act, 21 U.S.C. section 360bbb-3(b)(1), unless the authorization is terminated or revoked.  Performed at [facility], [address] [place], [place] [address]   MRSA PCR Screening     Status: None   Collection Time: 05/28/20  4:44 PM   Specimen: Nasal Mucosa; Nasopharyngeal  Result Value Ref Range Status   MRSA by PCR NEGATIVE NEGATIVE Final    Comment:        The GeneXpert MRSA Assay (FDA approved for NASAL specimens only), is one component of a comprehensive MRSA colonization surveillance program. It is not intended to diagnose MRSA infection nor to guide or monitor treatment for MRSA infections. Performed at [facility], [address] [place], [place] [address]     Time coordinating discharge: Approximately 40 minutes  Tyrone Nine, MD  [facility] 05/31/2020, 4:28 PM

## 2020-05-30 NOTE — Plan of Care (Signed)

## 2020-05-30 NOTE — Progress Notes (Addendum)
SATURATION QUALIFICATIONS: (This note is used to comply with regulatory documentation for home oxygen)  Patient Saturations on Room Air at Rest = 95%  Patient Saturations on Room Air while Ambulating = 92%  Patient Saturations on  2 Liters of oxygen while Ambulating = 97%  Please briefly explain why patient needs home oxygen:Patient  Does not require Supplemental oxygen. Blanchard Kelch PT Acute Rehabilitation Services Pager [phone] Office [phone]

## 2020-05-30 NOTE — Plan of Care (Signed)
Problem: Education: Goal: Knowledge of General Education information will improve Description: Including pain rating scale, medication(s)/side effects and non-pharmacologic comfort measures 05/30/2020 1433 by Verdie Shire, RN Outcome: Adequate for Discharge 05/30/2020 0858 by Verdie Shire, RN Outcome: Progressing   Problem: Health Behavior/Discharge Planning: Goal: Ability to manage health-related needs will improve 05/30/2020 1433 by Verdie Shire, RN Outcome: Adequate for Discharge 05/30/2020 0858 by Verdie Shire, RN Outcome: Progressing   Problem: Clinical Measurements: Goal: Ability to maintain clinical measurements within normal limits will improve 05/30/2020 1433 by Verdie Shire, RN Outcome: Adequate for Discharge 05/30/2020 0858 by Verdie Shire, RN Outcome: Progressing Goal: Will remain free from infection 05/30/2020 1433 by Verdie Shire, RN Outcome: Adequate for Discharge 05/30/2020 0858 by Verdie Shire, RN Outcome: Progressing Goal: Diagnostic test results will improve 05/30/2020 1433 by Verdie Shire, RN Outcome: Adequate for Discharge 05/30/2020 0858 by Verdie Shire, RN Outcome: Progressing Goal: Respiratory complications will improve 05/30/2020 1433 by Verdie Shire, RN Outcome: Adequate for Discharge 05/30/2020 0858 by Verdie Shire, RN Outcome: Progressing Goal: Cardiovascular complication will be avoided 05/30/2020 1433 by Verdie Shire, RN Outcome: Adequate for Discharge 05/30/2020 0858 by Verdie Shire, RN Outcome: Progressing   Problem: Education: Goal: Knowledge of General Education information will improve Description: Including pain rating scale, medication(s)/side effects and non-pharmacologic comfort measures 05/30/2020 1433 by Verdie Shire, RN Outcome: Adequate for Discharge 05/30/2020 0858 by Verdie Shire, RN Outcome: Progressing   Problem: Health Behavior/Discharge Planning: Goal:  Ability to manage health-related needs will improve 05/30/2020 1433 by Verdie Shire, RN Outcome: Adequate for Discharge 05/30/2020 0858 by Verdie Shire, RN Outcome: Progressing   Problem: Clinical Measurements: Goal: Ability to maintain clinical measurements within normal limits will improve 05/30/2020 1433 by Verdie Shire, RN Outcome: Adequate for Discharge 05/30/2020 0858 by Verdie Shire, RN Outcome: Progressing Goal: Will remain free from infection 05/30/2020 1433 by Verdie Shire, RN Outcome: Adequate for Discharge 05/30/2020 0858 by Verdie Shire, RN Outcome: Progressing Goal: Diagnostic test results will improve 05/30/2020 1433 by Verdie Shire, RN Outcome: Adequate for Discharge 05/30/2020 0858 by Verdie Shire, RN Outcome: Progressing Goal: Respiratory complications will improve 05/30/2020 1433 by Verdie Shire, RN Outcome: Adequate for Discharge 05/30/2020 0858 by Verdie Shire, RN Outcome: Progressing Goal: Cardiovascular complication will be avoided 05/30/2020 1433 by Verdie Shire, RN Outcome: Adequate for Discharge 05/30/2020 0858 by Verdie Shire, RN Outcome: Progressing   Problem: Activity: Goal: Risk for activity intolerance will decrease 05/30/2020 1433 by Verdie Shire, RN Outcome: Adequate for Discharge 05/30/2020 0858 by Verdie Shire, RN Outcome: Progressing   Problem: Nutrition: Goal: Adequate nutrition will be maintained 05/30/2020 1433 by Verdie Shire, RN Outcome: Adequate for Discharge 05/30/2020 0858 by Verdie Shire, RN Outcome: Progressing   Problem: Coping: Goal: Level of anxiety will decrease 05/30/2020 1433 by Verdie Shire, RN Outcome: Adequate for Discharge 05/30/2020 0858 by Verdie Shire, RN Outcome: Progressing   Problem: Elimination: Goal: Will not experience complications related to bowel motility 05/30/2020 1433 by Verdie Shire, RN Outcome: Adequate for  Discharge 05/30/2020 0858 by Verdie Shire, RN Outcome: Progressing Goal: Will not experience complications related to urinary retention 05/30/2020 1433 by Verdie Shire, RN Outcome: Adequate for Discharge 05/30/2020 0858 by Verdie Shire, RN Outcome: Progressing   Problem: Pain Managment: Goal: General experience of comfort will improve 05/30/2020 1433 by  Felishia Wartman, Birdie Hopes, RN Outcome: Adequate for Discharge 05/30/2020 0858 by Verdie Shire, RN Outcome: Progressing   Problem: Safety: Goal: Ability to remain free from injury will improve 05/30/2020 1433 by Lenny Fiumara, Birdie Hopes, RN Outcome: Adequate for Discharge 05/30/2020 0858 by Verdie Shire, RN Outcome: Progressing   Problem: Skin Integrity: Goal: Risk for impaired skin integrity will decrease 05/30/2020 1433 by Verdie Shire, RN Outcome: Adequate for Discharge 05/30/2020 0858 by Verdie Shire, RN Outcome: Progressing

## 2020-09-08 ENCOUNTER — Other Ambulatory Visit: Payer: Self-pay

## 2020-09-08 ENCOUNTER — Emergency Department (HOSPITAL_BASED_OUTPATIENT_CLINIC_OR_DEPARTMENT_OTHER)
Admission: EM | Admit: 2020-09-08 | Discharge: 2020-09-08 | Disposition: A | Discharge status: Home / Self Care | Payer: Medicare HMO | Attending: Emergency Medicine | Admitting: Emergency Medicine

## 2020-09-08 DIAGNOSIS — Z79899 Other long term (current) drug therapy: Secondary | ICD-10-CM | POA: Diagnosis not present

## 2020-09-08 DIAGNOSIS — F17210 Nicotine dependence, cigarettes, uncomplicated: Secondary | ICD-10-CM | POA: Diagnosis not present

## 2020-09-08 DIAGNOSIS — U071 COVID-19: Secondary | ICD-10-CM | POA: Insufficient documentation

## 2020-09-08 DIAGNOSIS — Z20822 Contact with and (suspected) exposure to covid-19: Secondary | ICD-10-CM

## 2020-09-08 DIAGNOSIS — Z7982 Long term (current) use of aspirin: Secondary | ICD-10-CM | POA: Diagnosis not present

## 2020-09-08 LAB — RESP PANEL BY RT-PCR (FLU A&B, COVID) ARPGX2
Influenza A by PCR: NEGATIVE
Influenza B by PCR: NEGATIVE
SARS Coronavirus 2 by RT PCR: POSITIVE — AB

## 2020-09-08 NOTE — ED Triage Notes (Signed)
Pt was around people with COVID a week ago. Denies symptoms but wants a test

## 2020-09-08 NOTE — Discharge Instructions (Addendum)
It was a pleasure taking care of you today.  As discussed, your COVID results should be available within the next 2 hours on MyChart.  If your test is positive, you must self quarantine for 7 days since symptom onset. Return to the ER for new or worsening symptoms.

## 2020-09-08 NOTE — ED Provider Notes (Signed)
[facility] EMERGENCY DEPARTMENT Provider Note   CSN: [id number] Arrival date & time: 09/08/20  1315     History Chief Complaint  Patient presents with   COVID Test    Shawna Smith is a 65 y.o. female with a history of MS who presents to the ED requesting a COVID test.  Patient states she was recently around someone who tested positive for COVID 1 week ago.  Patient denies cough, nasal congestion, sore throat, shortness of breath, chest pain, abdominal pain, nausea, vomiting, diarrhea.  Denies fever and chills.  She is currently vaccinated against COVID-19 including her booster shot. She denies any symptoms.   History obtained from patient and past medical records. No interpreter used during encounter.       Past Medical History:  Diagnosis Date   MS (multiple sclerosis) (HCC)    Osteoporosis     Patient Active Problem List   Diagnosis Date Noted   Protein-calorie malnutrition, severe 05/29/2020   Generalized weakness 05/28/2020   Acute respiratory failure with hypoxia (HCC) 05/28/2020   Multiple sclerosis (HCC) 05/28/2020   Hypokalemia 05/28/2020   Hypomagnesemia 05/28/2020   Prolonged QT interval 05/28/2020   Tobacco use 05/28/2020   Hand fracture, left 06/18/2018   Metacarpal bone fracture 06/18/2018    Past Surgical History:  Procedure Laterality Date   OPEN REDUCTION INTERNAL FIXATION (ORIF) METACARPAL Left 06/18/2018   Procedure: OPEN REDUCTION INTERNAL FIXATION (ORIF) LEFT HAND, MUTIPLE LEFT METACARPAL FRACTURES, AND OPEN REDUCTION OF MCP JOINT OF LEFT INDEX FINGER;  Surgeon: Dominica Severin, MD;  Location: [facility] OR;  Service: Orthopedics;  Laterality: Left;     OB History   No obstetric history on file.     No family history on file.  Social History   Tobacco Use   Smoking status: Every Day    Packs/day: 0.50    Pack years: 0.00    Types: Cigarettes   Smokeless tobacco: Never  Substance Use Topics   Alcohol use: Never   Drug use: Never     Home Medications Prior to Admission medications   Medication Sig Start Date End Date Taking? Authorizing Provider  albuterol (VENTOLIN HFA) 108 (90 Base) MCG/ACT inhaler Inhale 2 puffs into the lungs every 4 (four) hours as needed for wheezing. 04/25/15   [provider]  amitriptyline (ELAVIL) 50 MG tablet Take 1 tablet by mouth at bedtime. 03/13/19   [provider]  amLODipine (NORVASC) 10 MG tablet Take 10 mg by mouth daily. 05/02/20   [provider]  ascorbic Acid (VITAMIN C) 500 MG CPCR Take 500 mg by mouth daily.    [provider]  aspirin 81 MG EC tablet Take 81 mg by mouth daily. 04/25/15   [provider]  AUBAGIO 14 MG TABS Take 14 mg by mouth daily. 05/17/20   [provider]  Calcium Carb-Ergocalciferol 500-200 MG-UNIT TABS Take 1 tablet by mouth daily.    [provider]  cholecalciferol (VITAMIN D3) 25 MCG (1000 UNIT) tablet Take 1,000 Units by mouth daily.    [provider]  Ensure Plus (ENSURE PLUS) LIQD Take 237 mLs by mouth 3 (three) times daily between meals.    [provider]  EPINEPHrine 0.3 mg/0.3 mL IJ SOAJ injection Inject 0.3 mg into the muscle once as needed for anaphylaxis.    [provider]  gabapentin (NEURONTIN) 300 MG capsule Take 900 mg by mouth 2 (two) times daily.    [provider]  losartan-hydrochlorothiazide Mauri Reading)  50-12.5 MG tablet Take 1 tablet by mouth daily. 03/05/20   [provider]  methocarbamol (ROBAXIN) 500 MG tablet Take 1 tablet (500 mg total) by mouth every 6 (six) hours as needed for muscle spasms. 06/19/18   Dominica Severin, MD  metoprolol succinate (TOPROL-XL) 100 MG 24 hr tablet Take by mouth. 03/05/20   [provider]  Omega-3 Fatty Acids (FISH OIL) 1000 MG CAPS Take 1,000 mg by mouth daily.    [provider]  OXcarbazepine (TRILEPTAL) 150 MG tablet Take 150 mg by mouth 2 (two) times daily. 04/08/20   [provider]  oxyCODONE 10 MG TABS Take 0.5 tablets (5 mg total) by mouth every 4 (four) hours as needed for severe pain (pain score 7-10). 06/19/18   Dominica Severin, MD  traMADol (ULTRAM) 50 MG tablet Take 50 mg by mouth 4 (four) times daily as needed. 05/02/20   [provider]  traZODone (DESYREL) 50 MG tablet Take 50-100 mg by mouth at bedtime as needed. 04/17/20   [provider]    Allergies    Ace inhibitors, Penicillins, and Sulfa antibiotics  Review of Systems   Review of Systems  Constitutional:  Negative for chills and fever.  HENT:  Negative for congestion and sore throat.   Respiratory:  Negative for cough and shortness of breath.   Cardiovascular:  Negative for chest pain.  Gastrointestinal:  Negative for abdominal pain, diarrhea, nausea and vomiting.   Physical Exam Updated Vital Signs BP (!) 132/93 (BP Location: Left Arm)   Pulse 75   Temp 98.2 F (36.8 C) (Oral)   Resp 18   Ht 5\' 3"  (1.6 m)   Wt 49.4 kg   SpO2 96%   BMI 19.31 kg/m   Physical Exam Vitals and nursing note reviewed.  Constitutional:      General: She is not in acute distress.    Appearance: She is not ill-appearing.  HENT:     Head: Normocephalic.  Eyes:     Pupils: Pupils are equal, round, and reactive to light.  Cardiovascular:     Rate and Rhythm: Normal rate and regular rhythm.     Pulses: Normal pulses.     Heart sounds: Normal heart sounds. No murmur heard.   No friction rub. No gallop.  Pulmonary:     Effort: Pulmonary effort is normal.     Breath sounds: Normal breath sounds.     Comments: Respirations equal and unlabored, patient able to speak in full sentences, lungs clear to auscultation bilaterally Abdominal:     General: Abdomen is flat. There is no distension.     Palpations: Abdomen is soft.     Tenderness: There is no abdominal tenderness. There is no guarding or rebound.  Musculoskeletal:        General: Normal range of motion.     Cervical back:  Neck supple.  Skin:    General: Skin is warm and dry.  Neurological:     General: No focal deficit present.     Mental Status: She is alert.  Psychiatric:        Mood and Affect: Mood normal.        Behavior: Behavior normal.    ED Results / Procedures / Treatments   Labs (all labs ordered are listed, but only abnormal results are displayed) Labs Reviewed  RESP PANEL BY RT-PCR (FLU A&B, COVID) [id number]    EKG None  Radiology No results found.  Procedures Procedures   Medications  Ordered in ED Medications - No data to display  ED Course  I have reviewed the triage vital signs and the nursing notes.  Pertinent labs & imaging results that were available during my care of the patient were reviewed by me and considered in my medical decision making (see chart for details).    MDM Rules/Calculators/A&P                         65 year old female presents to the ED requesting a COVID test after a positive exposure 1 week ago.  She is currently vaccinated against COVID-19 including her booster shot.  Patient denies any symptoms.  Stable vitals.  Patient in no acute distress.  Physical exam reassuring.  Lungs clear to auscultation bilaterally.  Abdomen soft, nondistended, nontender.  COVID test pending.  Instructed patient to check results on MyChart. Strict ED precautions discussed with patient. Patient states understanding and agrees to plan. Patient discharged home in no acute distress and stable vitals  Shawna Smith was evaluated in Emergency Department on 09/08/2020 for the symptoms described in the history of present illness. She was evaluated in the context of the global COVID-19 pandemic, which necessitated consideration that the patient might be at risk for infection with the SARS-CoV-2 virus that causes COVID-19. Institutional protocols and algorithms that pertain to the evaluation of patients at risk for COVID-19 are in a state of rapid change based on information released by  regulatory bodies including the CDC and federal and state organizations. These policies and algorithms were followed during the patient's care in the ED.  Final Clinical Impression(s) / ED Diagnoses Final diagnoses:  Close exposure to COVID-19 virus    Rx / DC Orders ED Discharge Orders     None        Jesusita Oka 09/08/20 1333    Pricilla Loveless, MD 09/09/20 [phone]

## 2020-09-24 ENCOUNTER — Other Ambulatory Visit: Payer: Self-pay

## 2020-09-24 ENCOUNTER — Emergency Department (HOSPITAL_BASED_OUTPATIENT_CLINIC_OR_DEPARTMENT_OTHER)
Admission: EM | Admit: 2020-09-24 | Discharge: 2020-09-24 | Disposition: A | Discharge status: Home / Self Care | Payer: Medicare HMO | Attending: Emergency Medicine | Admitting: Emergency Medicine

## 2020-09-24 ENCOUNTER — Encounter (HOSPITAL_BASED_OUTPATIENT_CLINIC_OR_DEPARTMENT_OTHER): Payer: Self-pay | Admitting: *Deleted

## 2020-09-24 DIAGNOSIS — Z5321 Procedure and treatment not carried out due to patient leaving prior to being seen by health care provider: Secondary | ICD-10-CM | POA: Diagnosis not present

## 2020-09-24 DIAGNOSIS — U071 COVID-19: Secondary | ICD-10-CM | POA: Diagnosis not present

## 2020-09-24 DIAGNOSIS — R059 Cough, unspecified: Secondary | ICD-10-CM | POA: Diagnosis present

## 2020-09-24 NOTE — ED Triage Notes (Signed)
Cough over a week. She had Covid 16 days ago. Diarrhea. Weakness. She lives with family.

## 2020-09-25 ENCOUNTER — Emergency Department (HOSPITAL_BASED_OUTPATIENT_CLINIC_OR_DEPARTMENT_OTHER): Payer: Medicare HMO

## 2020-09-25 ENCOUNTER — Emergency Department (HOSPITAL_BASED_OUTPATIENT_CLINIC_OR_DEPARTMENT_OTHER)
Admission: EM | Admit: 2020-09-25 | Discharge: 2020-09-25 | Disposition: A | Discharge status: Home / Self Care | Payer: Medicare HMO | Attending: Emergency Medicine | Admitting: Emergency Medicine

## 2020-09-25 ENCOUNTER — Encounter (HOSPITAL_BASED_OUTPATIENT_CLINIC_OR_DEPARTMENT_OTHER): Payer: Self-pay

## 2020-09-25 DIAGNOSIS — R059 Cough, unspecified: Secondary | ICD-10-CM | POA: Insufficient documentation

## 2020-09-25 DIAGNOSIS — Z79899 Other long term (current) drug therapy: Secondary | ICD-10-CM | POA: Insufficient documentation

## 2020-09-25 DIAGNOSIS — R630 Anorexia: Secondary | ICD-10-CM | POA: Insufficient documentation

## 2020-09-25 DIAGNOSIS — R531 Weakness: Secondary | ICD-10-CM | POA: Insufficient documentation

## 2020-09-25 DIAGNOSIS — I10 Essential (primary) hypertension: Secondary | ICD-10-CM | POA: Diagnosis not present

## 2020-09-25 DIAGNOSIS — E43 Unspecified severe protein-calorie malnutrition: Secondary | ICD-10-CM | POA: Insufficient documentation

## 2020-09-25 DIAGNOSIS — Z7982 Long term (current) use of aspirin: Secondary | ICD-10-CM | POA: Insufficient documentation

## 2020-09-25 DIAGNOSIS — F17210 Nicotine dependence, cigarettes, uncomplicated: Secondary | ICD-10-CM | POA: Insufficient documentation

## 2020-09-25 DIAGNOSIS — G35 Multiple sclerosis: Secondary | ICD-10-CM | POA: Diagnosis not present

## 2020-09-25 DIAGNOSIS — Z8616 Personal history of COVID-19: Secondary | ICD-10-CM | POA: Insufficient documentation

## 2020-09-25 HISTORY — DX: Essential (primary) hypertension: I10

## 2020-09-25 LAB — BASIC METABOLIC PANEL
Anion gap: 10 (ref 5–15)
BUN: 18 mg/dL (ref 8–23)
CO2: 26 mmol/L (ref 22–32)
Calcium: 9.4 mg/dL (ref 8.9–10.3)
Chloride: 96 mmol/L — ABNORMAL LOW (ref 98–111)
Creatinine, Ser: 1.34 mg/dL — ABNORMAL HIGH (ref 0.44–1.00)
GFR, Estimated: 45 mL/min — ABNORMAL LOW (ref >60)
Glucose, Bld: 120 mg/dL — ABNORMAL HIGH (ref 70–99)
Potassium: 4.1 mmol/L (ref 3.5–5.1)
Sodium: 132 mmol/L — ABNORMAL LOW (ref 135–145)

## 2020-09-25 LAB — HEPATIC FUNCTION PANEL
ALT: 8 U/L (ref 0–44)
AST: 22 U/L (ref 15–41)
Albumin: 3.9 g/dL (ref 3.5–5.0)
Alkaline Phosphatase: 94 U/L (ref 38–126)
Bilirubin, Direct: 0.2 mg/dL (ref 0.0–0.2)
Indirect Bilirubin: 0.4 mg/dL (ref 0.3–0.9)
Total Bilirubin: 0.6 mg/dL (ref 0.3–1.2)
Total Protein: 7.5 g/dL (ref 6.5–8.1)

## 2020-09-25 LAB — CBC
HCT: 40.2 % (ref 36.0–46.0)
Hemoglobin: 14.5 g/dL (ref 12.0–15.0)
MCH: 35.3 pg — ABNORMAL HIGH (ref 26.0–34.0)
MCHC: 36.1 g/dL — ABNORMAL HIGH (ref 30.0–36.0)
MCV: 97.8 fL (ref 80.0–100.0)
Platelets: 320 10*3/uL (ref 150–400)
RBC: 4.11 MIL/uL (ref 3.87–5.11)
RDW: 14.6 % (ref 11.5–15.5)
WBC: 5.8 10*3/uL (ref 4.0–10.5)
nRBC: 0 % (ref 0.0–0.2)

## 2020-09-25 LAB — URINALYSIS, MICROSCOPIC (REFLEX)

## 2020-09-25 LAB — URINALYSIS, ROUTINE W REFLEX MICROSCOPIC
Bilirubin Urine: NEGATIVE
Glucose, UA: NEGATIVE mg/dL
Hgb urine dipstick: NEGATIVE
Ketones, ur: NEGATIVE mg/dL
Nitrite: NEGATIVE
Protein, ur: NEGATIVE mg/dL
Specific Gravity, Urine: 1.02 (ref 1.005–1.030)
pH: 6.5 (ref 5.0–8.0)

## 2020-09-25 MED ORDER — PREDNISONE 50 MG PO TABS
60.0000 mg | ORAL_TABLET | Freq: Once | ORAL | Status: AC
  Administered 2020-09-25: 60 mg via ORAL
  Filled 2020-09-25: qty 1

## 2020-09-25 MED ORDER — SODIUM CHLORIDE 0.9 % IV BOLUS
1000.0000 mL | Freq: Once | INTRAVENOUS | Status: AC
  Administered 2020-09-25: 1000 mL via INTRAVENOUS

## 2020-09-25 MED ORDER — PREDNISONE 10 MG PO TABS: 40.0000 mg | ORAL_TABLET | Freq: Every day | ORAL | 0 refills | Status: DC

## 2020-09-25 NOTE — ED Notes (Signed)
Lab made aware of urine culture add-on.  

## 2020-09-25 NOTE — ED Provider Notes (Signed)
[facility] EMERGENCY DEPARTMENT Provider Note   CSN: [id number] Arrival date & time: 09/25/20  1254     History No chief complaint on file.   Shawna Smith is a 65 y.o. female.  [employer] history of multiple sclerosis.  She contracted COVID-19 a little over 2 weeks ago.  No therapeutics were given.  She presents with ongoing, acute on chronic, weakness.  She states that she is unable to ambulate even with her walker.  She is able to get out of bed, but anytime she tries to stand for any length of time, she develops severe weakness.  She has seen her neurologist recently, and the weakness was present at that appointment.  Home physical therapy was initiated, and she was given assistive equipment such as a walker.  She does endorse ongoing anorexia.  She has unable to eat much, and she has been subsisting on chicken noodle soup.  The history is provided by the patient and a relative (sister).  Weakness Severity:  Severe Onset quality:  Gradual Duration: present for months, worse since contracting COVID-19 over 2 weeks ago. Timing:  Constant Progression:  Worsening Chronicity:  Chronic Context comment:  Decreased oral intake Relieved by:  Nothing Worsened by:  Standing Ineffective treatments: use of a walker. Associated symptoms: anorexia and cough   Associated symptoms: no abdominal pain, no aphasia, no arthralgias, no chest pain, no diarrhea, no dysuria, no fever, no seizures, no sensory-motor deficit, no shortness of breath, no stroke symptoms and no vomiting       Past Medical History:  Diagnosis Date   Hypertension    MS (multiple sclerosis) (HCC)    Osteoporosis     Patient Active Problem List   Diagnosis Date Noted   Protein-calorie malnutrition, severe 05/29/2020   Generalized weakness 05/28/2020   Acute respiratory failure with hypoxia (HCC) 05/28/2020   Multiple sclerosis (HCC) 05/28/2020   Hypokalemia 05/28/2020   Hypomagnesemia 05/28/2020   Prolonged  QT interval 05/28/2020   Tobacco use 05/28/2020   Hand fracture, left 06/18/2018   Metacarpal bone fracture 06/18/2018    Past Surgical History:  Procedure Laterality Date   OPEN REDUCTION INTERNAL FIXATION (ORIF) METACARPAL Left 06/18/2018   Procedure: OPEN REDUCTION INTERNAL FIXATION (ORIF) LEFT HAND, MUTIPLE LEFT METACARPAL FRACTURES, AND OPEN REDUCTION OF MCP JOINT OF LEFT INDEX FINGER;  Surgeon: Dominica Severin, MD;  Location: [facility] OR;  Service: Orthopedics;  Laterality: Left;     OB History   No obstetric history on file.     History reviewed. No pertinent family history.  Social History   Tobacco Use   Smoking status: Every Day    Packs/day: 0.50    Types: Cigarettes   Smokeless tobacco: Never  Vaping Use   Vaping Use: Never used  Substance Use Topics   Alcohol use: Yes   Drug use: Never    Home Medications Prior to Admission medications   Medication Sig Start Date End Date Taking? Authorizing Provider  albuterol (VENTOLIN HFA) 108 (90 Base) MCG/ACT inhaler Inhale 2 puffs into the lungs every 4 (four) hours as needed for wheezing. 04/25/15   [provider]  amitriptyline (ELAVIL) 50 MG tablet Take 1 tablet by mouth at bedtime. 03/13/19   [provider]  amLODipine (NORVASC) 10 MG tablet Take 10 mg by mouth daily. 05/02/20   [provider]  ascorbic Acid (VITAMIN C) 500 MG CPCR Take 500 mg by mouth daily.    [provider]  aspirin 81 MG  EC tablet Take 81 mg by mouth daily. 04/25/15   [provider]  AUBAGIO 14 MG TABS Take 14 mg by mouth daily. 05/17/20   [provider]  Calcium Carb-Ergocalciferol 500-200 MG-UNIT TABS Take 1 tablet by mouth daily.    [provider]  cholecalciferol (VITAMIN D3) 25 MCG (1000 UNIT) tablet Take 1,000 Units by mouth daily.    [provider]  Ensure Plus (ENSURE PLUS) LIQD Take 237 mLs by mouth 3 (three) times daily between meals.    [provider]   EPINEPHrine 0.3 mg/0.3 mL IJ SOAJ injection Inject 0.3 mg into the muscle once as needed for anaphylaxis.    [provider]  gabapentin (NEURONTIN) 300 MG capsule Take 900 mg by mouth 2 (two) times daily.    [provider]  losartan-hydrochlorothiazide (HYZAAR) 50-12.5 MG tablet Take 1 tablet by mouth daily. 03/05/20   [provider]  methocarbamol (ROBAXIN) 500 MG tablet Take 1 tablet (500 mg total) by mouth every 6 (six) hours as needed for muscle spasms. 06/19/18   Dominica Severin, MD  metoprolol succinate (TOPROL-XL) 100 MG 24 hr tablet Take by mouth. 03/05/20   [provider]  Omega-3 Fatty Acids (FISH OIL) 1000 MG CAPS Take 1,000 mg by mouth daily.    [provider]  OXcarbazepine (TRILEPTAL) 150 MG tablet Take 150 mg by mouth 2 (two) times daily. 04/08/20   [provider]  oxyCODONE 10 MG TABS Take 0.5 tablets (5 mg total) by mouth every 4 (four) hours as needed for severe pain (pain score 7-10). 06/19/18   Dominica Severin, MD  traMADol (ULTRAM) 50 MG tablet Take 50 mg by mouth 4 (four) times daily as needed. 05/02/20   [provider]  traZODone (DESYREL) 50 MG tablet Take 50-100 mg by mouth at bedtime as needed. 04/17/20   [provider]    Allergies    Ace inhibitors, Penicillins, and Sulfa antibiotics  Review of Systems   Review of Systems  Constitutional:  Negative for chills and fever.  HENT:  Negative for ear pain and sore throat.   Eyes:  Negative for pain and visual disturbance.  Respiratory:  Positive for cough. Negative for shortness of breath.   Cardiovascular:  Negative for chest pain and palpitations.  Gastrointestinal:  Positive for anorexia. Negative for abdominal pain, diarrhea and vomiting.  Genitourinary:  Negative for dysuria and hematuria.  Musculoskeletal:  Negative for arthralgias and back pain.  Skin:  Negative for color change and rash.  Neurological:  Positive for weakness. Negative for  seizures and syncope.  All other systems reviewed and are negative.  Physical Exam Updated Vital Signs BP (!) 151/79   Pulse 83   Temp 98.5 F (36.9 C) (Oral)   Resp 16   Ht 5\' 3"  (1.6 m)   Wt 49 kg   SpO2 98%   BMI 19.13 kg/m   Physical Exam Vitals and nursing note reviewed.  Constitutional:      Appearance: She is well-developed.     Comments: frail  HENT:     Head: Normocephalic and atraumatic.  Cardiovascular:     Rate and Rhythm: Normal rate and regular rhythm.     Heart sounds: Normal heart sounds.  Pulmonary:     Effort: Pulmonary effort is normal. No tachypnea.     Breath sounds: Normal breath sounds.  Abdominal:     Palpations: Abdomen is soft.     Tenderness: There is no abdominal tenderness.  Musculoskeletal:     Right lower leg: No edema.     Left lower leg: No edema.  Skin:    General: Skin is warm and dry.  Neurological:     General: No focal deficit present.     Mental Status: She is alert and oriented to person, place, and time.     Comments: She is able to stand with only minimal assistance.  Is able to take steps without ataxia.  She was assisted by 2 people for this assessment.  Psychiatric:        Mood and Affect: Mood normal.        Behavior: Behavior normal.    ED Results / Procedures / Treatments   Labs (all labs ordered are listed, but only abnormal results are displayed) Labs Reviewed  BASIC METABOLIC PANEL - Abnormal; Notable for the following components:      Result Value   Sodium 132 (*)    Chloride 96 (*)    Glucose, Bld 120 (*)    Creatinine, Ser 1.34 (*)    GFR, Estimated 45 (*)    All other components within normal limits  CBC - Abnormal; Notable for the following components:   MCH 35.3 (*)    MCHC 36.1 (*)    All other components within normal limits  URINALYSIS, ROUTINE W REFLEX MICROSCOPIC    EKG EKG Interpretation  Date/Time:  Wednesday September 25 2020 13:44:12 EDT Ventricular Rate:  69 PR Interval:  145 QRS  Duration: 91 QT Interval:  401 QTC Calculation: 430 R Axis:   85 Text Interpretation: Sinus rhythm Left atrial enlargement Borderline right axis deviation Probable LVH with secondary repol abnrm similar to prior Confirmed by Pieter Partridge ([id number]) on 09/25/2020 2:32:32 PM  Radiology CT Head Wo Contrast  Result Date: 09/25/2020 CLINICAL DATA:  Mental status change. Unknown cause. COVID 16 days ago. Unable to walk. EXAM: CT HEAD WITHOUT CONTRAST TECHNIQUE: Contiguous axial images were obtained from the base of the skull through the vertex without intravenous contrast. COMPARISON:  MR head without contrast 05/29/2020 FINDINGS: Brain: Mild atrophy and white matter changes are stable. 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 No acute infarct, hemorrhage, or mass lesion is present. The ventricles are of normal size. No significant extraaxial fluid collection is present. Vascular: Atherosclerotic calcifications present within the cavernous internal carotid arteries bilaterally. No hyperdense vessel is present. Skull: Calvarium is intact. No focal lytic or blastic lesions are present. No significant extracranial soft tissue lesion is present. Sinuses/Orbits: The paranasal sinuses and mastoid air cells are clear. The globes and orbits are within normal limits. IMPRESSION: 1. No acute intracranial abnormality or significant interval change. 2. Stable atrophy and white matter disease. 3. Remote medial left occipital lobe infarct. Electronically Signed   By: Marin Roberts M.D.   On: 09/25/2020 16:13   DG Chest Port 1 View  Result Date: 09/25/2020 CLINICAL DATA:  Cough, diarrhea and weakness for 1 week. The patient reports she tested positive for COVID-19 16 days ago. EXAM: PORTABLE CHEST 1 VIEW COMPARISON:  Single-view of the chest and CT chest 05/28/2020. FINDINGS: The lungs are clear. Heart size is normal. Aortic atherosclerosis. No pneumothorax or pleural fluid. No acute or focal bony  abnormality. IMPRESSION: No acute disease. Aortic Atherosclerosis (ICD10-I70.0). Electronically Signed   By: Drusilla Kanner M.D.   On: 09/25/2020 15:25    Procedures Procedures   Medications Ordered in ED Medications  sodium chloride 0.9 % bolus 1,000 mL (has  no administration in time range)    ED Course  I have reviewed the triage vital signs and the nursing notes.  Pertinent labs & imaging results that were available during my care of the patient were reviewed by me and considered in my medical decision making (see chart for details).    MDM Rules/Calculators/A&P                           Starlet Bergfeld has a history of severe weakness, malnutrition, and multiple sclerosis.  She recently had COVID-19.  She presents with acute on chronic weakness.  She will be evaluated for evidence of electrolyte abnormality, pneumonia, CNS pathology.  She was given IV fluid for mild dehydration.  Final Clinical Impression(s) / ED Diagnoses Final diagnoses:  Weakness  Multiple sclerosis (HCC)  Protein-calorie malnutrition, severe  History of COVID-19    Rx / DC Orders ED Discharge Orders     None        Koleen Distance, MD 09/27/20 [phone]

## 2020-09-25 NOTE — ED Provider Notes (Addendum)
Patient's head CT without any acute findings.  Patient feeling better after the IV fluids.  Patient is showing some evidence of acute kidney injury.  Probably from some dehydration.  The IV fluid she received here should help that.  She follow-up with primary care doctor to have your kidney function rechecked.  Patient requested steroids.  Noted by reviewing her neurology visits that oftentimes she is given Toradol.  But based on today's renal function do not recommend giving her a shot of Toradol.  Will instead give her 60 mg prednisone then a 5-day course of prednisone to take at home this may help increase her energy levels.  She will follow-up with her neurologist.  And follow back up with her primary care doctor to have renal function rechecked.   Vanetta Mulders, MD 09/25/20 1712  Urinalysis not consistent with urinary tract infection.  But there was many bacteria will send for culture.  Patient stable for discharge home.   Vanetta Mulders, MD 09/25/20 [phone]

## 2020-09-25 NOTE — Discharge Instructions (Signed)
Patient overall feeling better following the IV fluids.  Make an appointment follow-up with your neurologist.  Take the prednisone as directed this may help some of your strength.  Today's work-up without any significant findings other than a little bit worse renal function.  Could have your primary care doctor recheck that.  Most likely secondary to some dehydration.  The IV fluids that she received today will probably help that.

## 2020-09-27 LAB — URINE CULTURE: Culture: <10000 — AB

## 2020-09-30 DIAGNOSIS — F1021 Alcohol dependence, in remission: Secondary | ICD-10-CM | POA: Insufficient documentation

## 2021-06-21 ENCOUNTER — Emergency Department (HOSPITAL_BASED_OUTPATIENT_CLINIC_OR_DEPARTMENT_OTHER)
Admission: EM | Admit: 2021-06-21 | Discharge: 2021-06-21 | Disposition: A | Discharge status: Other Acute Inpatient Hospital | Payer: Medicare HMO | Attending: Emergency Medicine | Admitting: Emergency Medicine

## 2021-06-21 ENCOUNTER — Emergency Department (HOSPITAL_BASED_OUTPATIENT_CLINIC_OR_DEPARTMENT_OTHER): Payer: Medicare HMO

## 2021-06-21 ENCOUNTER — Other Ambulatory Visit: Payer: Self-pay

## 2021-06-21 ENCOUNTER — Encounter (HOSPITAL_BASED_OUTPATIENT_CLINIC_OR_DEPARTMENT_OTHER): Payer: Self-pay | Admitting: Emergency Medicine

## 2021-06-21 DIAGNOSIS — Z7982 Long term (current) use of aspirin: Secondary | ICD-10-CM | POA: Insufficient documentation

## 2021-06-21 DIAGNOSIS — I10 Essential (primary) hypertension: Secondary | ICD-10-CM | POA: Insufficient documentation

## 2021-06-21 DIAGNOSIS — R531 Weakness: Secondary | ICD-10-CM | POA: Diagnosis present

## 2021-06-21 DIAGNOSIS — N3289 Other specified disorders of bladder: Secondary | ICD-10-CM | POA: Diagnosis not present

## 2021-06-21 DIAGNOSIS — N390 Urinary tract infection, site not specified: Secondary | ICD-10-CM | POA: Insufficient documentation

## 2021-06-21 DIAGNOSIS — E876 Hypokalemia: Secondary | ICD-10-CM | POA: Insufficient documentation

## 2021-06-21 DIAGNOSIS — Z20822 Contact with and (suspected) exposure to covid-19: Secondary | ICD-10-CM | POA: Insufficient documentation

## 2021-06-21 DIAGNOSIS — W19XXXA Unspecified fall, initial encounter: Secondary | ICD-10-CM | POA: Insufficient documentation

## 2021-06-21 DIAGNOSIS — R339 Retention of urine, unspecified: Secondary | ICD-10-CM

## 2021-06-21 DIAGNOSIS — Z79899 Other long term (current) drug therapy: Secondary | ICD-10-CM | POA: Insufficient documentation

## 2021-06-21 DIAGNOSIS — J189 Pneumonia, unspecified organism: Secondary | ICD-10-CM | POA: Diagnosis not present

## 2021-06-21 LAB — CBC WITH DIFFERENTIAL/PLATELET
Abs Immature Granulocytes: 0.02 10*3/uL (ref 0.00–0.07)
Basophils Absolute: 0.1 10*3/uL (ref 0.0–0.1)
Basophils Relative: 1 %
Eosinophils Absolute: 0 10*3/uL (ref 0.0–0.5)
Eosinophils Relative: 0 %
HCT: 50.6 % — ABNORMAL HIGH (ref 36.0–46.0)
Hemoglobin: 17.7 g/dL — ABNORMAL HIGH (ref 12.0–15.0)
Immature Granulocytes: 0 %
Lymphocytes Relative: 14 %
Lymphs Abs: 1.4 10*3/uL (ref 0.7–4.0)
MCH: 34 pg (ref 26.0–34.0)
MCHC: 35 g/dL (ref 30.0–36.0)
MCV: 97.3 fL (ref 80.0–100.0)
Monocytes Absolute: 1.9 10*3/uL — ABNORMAL HIGH (ref 0.1–1.0)
Monocytes Relative: 19 %
Neutro Abs: 7 10*3/uL (ref 1.7–7.7)
Neutrophils Relative %: 66 %
Platelets: 207 10*3/uL (ref 150–400)
RBC: 5.2 MIL/uL — ABNORMAL HIGH (ref 3.87–5.11)
RDW: 16.4 % — ABNORMAL HIGH (ref 11.5–15.5)
WBC: 10.5 10*3/uL (ref 4.0–10.5)
nRBC: 0 % (ref 0.0–0.2)

## 2021-06-21 LAB — COMPREHENSIVE METABOLIC PANEL
ALT: 15 U/L (ref 0–44)
AST: 40 U/L (ref 15–41)
Albumin: 4.7 g/dL (ref 3.5–5.0)
Alkaline Phosphatase: 88 U/L (ref 38–126)
Anion gap: 11 (ref 5–15)
BUN: 16 mg/dL (ref 8–23)
CO2: 27 mmol/L (ref 22–32)
Calcium: 9.9 mg/dL (ref 8.9–10.3)
Chloride: 100 mmol/L (ref 98–111)
Creatinine, Ser: 1.53 mg/dL — ABNORMAL HIGH (ref 0.44–1.00)
GFR, Estimated: 38 mL/min — ABNORMAL LOW (ref >60)
Glucose, Bld: 152 mg/dL — ABNORMAL HIGH (ref 70–99)
Potassium: 2.6 mmol/L — CL (ref 3.5–5.1)
Sodium: 138 mmol/L (ref 135–145)
Total Bilirubin: 0.9 mg/dL (ref 0.3–1.2)
Total Protein: 8.5 g/dL — ABNORMAL HIGH (ref 6.5–8.1)

## 2021-06-21 LAB — URINALYSIS, ROUTINE W REFLEX MICROSCOPIC
Bilirubin Urine: NEGATIVE
Glucose, UA: NEGATIVE mg/dL
Ketones, ur: NEGATIVE mg/dL
Nitrite: POSITIVE — AB
Protein, ur: 100 mg/dL — AB
Specific Gravity, Urine: 1.01 (ref 1.005–1.030)
pH: 6 (ref 5.0–8.0)

## 2021-06-21 LAB — MAGNESIUM: Magnesium: 2 mg/dL (ref 1.7–2.4)

## 2021-06-21 LAB — RESP PANEL BY RT-PCR (FLU A&B, COVID) ARPGX2
Influenza A by PCR: NEGATIVE
Influenza B by PCR: NEGATIVE
SARS Coronavirus 2 by RT PCR: NEGATIVE

## 2021-06-21 LAB — URINALYSIS, MICROSCOPIC (REFLEX): WBC, UA: >50 WBC/hpf (ref 0–5)

## 2021-06-21 LAB — LIPASE, BLOOD: Lipase: 26 U/L (ref 11–51)

## 2021-06-21 LAB — LACTIC ACID, PLASMA: Lactic Acid, Venous: 1.6 mmol/L (ref 0.5–1.9)

## 2021-06-21 MED ORDER — SODIUM CHLORIDE 0.9 % IV SOLN
1.0000 g | Freq: Once | INTRAVENOUS | Status: AC
  Administered 2021-06-21: 1 g via INTRAVENOUS
  Filled 2021-06-21: qty 10

## 2021-06-21 MED ORDER — SODIUM CHLORIDE 0.9 % IV SOLN: INTRAVENOUS | Status: DC | PRN

## 2021-06-21 MED ORDER — HYDRALAZINE HCL 25 MG PO TABS
25.0000 mg | ORAL_TABLET | Freq: Once | ORAL | Status: DC
Start: 2021-06-21 — End: 2021-06-21
  Filled 2021-06-21: qty 1

## 2021-06-21 MED ORDER — AZITHROMYCIN 500 MG IV SOLR
500.0000 mg | Freq: Once | INTRAVENOUS | Status: AC
  Administered 2021-06-21: 500 mg via INTRAVENOUS
  Filled 2021-06-21: qty 5

## 2021-06-21 MED ORDER — HYDROMORPHONE HCL 1 MG/ML IJ SOLN
1.0000 mg | Freq: Once | INTRAMUSCULAR | Status: AC
  Administered 2021-06-21: 1 mg via INTRAVENOUS
  Filled 2021-06-21: qty 1

## 2021-06-21 MED ORDER — IOHEXOL 300 MG/ML  SOLN
100.0000 mL | Freq: Once | INTRAMUSCULAR | Status: AC | PRN
  Administered 2021-06-21: 75 mL via INTRAVENOUS

## 2021-06-21 MED ORDER — ONDANSETRON HCL 4 MG/2ML IJ SOLN
4.0000 mg | Freq: Once | INTRAMUSCULAR | Status: AC
  Administered 2021-06-21: 4 mg via INTRAVENOUS
  Filled 2021-06-21: qty 2

## 2021-06-21 MED ORDER — POTASSIUM CHLORIDE 10 MEQ/100ML IV SOLN
10.0000 meq | INTRAVENOUS | Status: AC
  Administered 2021-06-21 (×3): 10 meq via INTRAVENOUS
  Filled 2021-06-21 (×3): qty 100

## 2021-06-21 MED ORDER — POTASSIUM CHLORIDE CRYS ER 20 MEQ PO TBCR
40.0000 meq | EXTENDED_RELEASE_TABLET | Freq: Once | ORAL | Status: AC
  Administered 2021-06-21: 40 meq via ORAL
  Filled 2021-06-21: qty 2

## 2021-06-21 MED ORDER — LACTATED RINGERS IV BOLUS
1000.0000 mL | Freq: Once | INTRAVENOUS | Status: AC
  Administered 2021-06-21: 1000 mL via INTRAVENOUS

## 2021-06-21 NOTE — ED Notes (Signed)
Unable to obtain BCxs; EDP aware ?

## 2021-06-21 NOTE — ED Provider Notes (Signed)
?[facility] EMERGENCY DEPARTMENT ?Provider Note ? ? ?CSN: [id number] ?Arrival date & time: 06/21/21  1559 ? ?  ? ?History ? ?Chief Complaint  ?Patient presents with  ? Fall  ? ? ?Shawna Smith is a 66 y.o. female. ? ?HPI ? ?  ? ?4 days of abdominal pain, bloating, worse right lower side ?Pink vaginal discharge, cramping, feels like going to have period, has not had vaginal bleeding, is postmenopausal ?Not sexually active ?No rectal bleeding ?No constipation or diarrhea ?No nausea or vomiting ?Can't tell if had fever ?No urinary symptoms ?Feels generally weak, difficult time getting out of bed ?Since Thursday had 4 falls, mostly getting up trying to walk to the bathroom ? ?No cough, dyspnea, chest pain ? ?Went to sleep, tried to get out of bed and fell, did not feel dizzy or off balance ? ?MS weaker on Left at baseline ?Not having new focal weakness, no new numbess ?Not feeling dizzy/lightheaded just felt weak ?Has been in bed for 4 days  ?Has been having difficulty urinating for 2 days ?   ?Past Medical History:  ?Diagnosis Date  ? Hypertension   ? MS (multiple sclerosis) ([place])   ? Osteoporosis   ?  ?Past Surgical History:  ?Procedure Laterality Date  ? OPEN REDUCTION INTERNAL FIXATION (ORIF) METACARPAL Left 06/18/2018  ? Procedure: OPEN REDUCTION INTERNAL FIXATION (ORIF) LEFT HAND, MUTIPLE LEFT METACARPAL FRACTURES, AND OPEN REDUCTION OF MCP JOINT OF LEFT INDEX FINGER;  Surgeon: Roseanne Kaufman, MD;  Location: [place];  Service: Orthopedics;  Laterality: Left;  ?  ?Home Medications ?Prior to Admission medications   ?Medication Sig Start Date End Date Taking? Authorizing Provider  ?albuterol (VENTOLIN HFA) 108 (90 Base) MCG/ACT inhaler Inhale 2 puffs into the lungs every 4 (four) hours as needed for wheezing. 04/25/15   [provider]  ?amitriptyline (ELAVIL) 50 MG tablet Take 1 tablet by mouth at bedtime. 03/13/19   [provider]  ?amLODipine (NORVASC) 10 MG tablet Take 10 mg by mouth daily.  05/02/20   [provider]  ?ascorbic Acid (VITAMIN C) 500 MG CPCR Take 500 mg by mouth daily.    [provider]  ?aspirin 81 MG EC tablet Take 81 mg by mouth daily. 04/25/15   [provider]  ?AUBAGIO 14 MG TABS Take 14 mg by mouth daily. 05/17/20   [provider]  ?Calcium Carb-Ergocalciferol 500-200 MG-UNIT TABS Take 1 tablet by mouth daily.    [provider]  ?cholecalciferol (VITAMIN D3) 25 MCG (1000 UNIT) tablet Take 1,000 Units by mouth daily.    [provider]  ?Ensure Plus (ENSURE PLUS) LIQD Take 237 mLs by mouth 3 (three) times daily between meals.    [provider]  ?EPINEPHrine 0.3 mg/0.3 mL IJ SOAJ injection Inject 0.3 mg into the muscle once as needed for anaphylaxis.    [provider]  ?gabapentin (NEURONTIN) 300 MG capsule Take 900 mg by mouth 2 (two) times daily.    [provider]  ?losartan-hydrochlorothiazide (HYZAAR) 50-12.5 MG tablet Take 1 tablet by mouth daily. 03/05/20   [provider]  ?methocarbamol (ROBAXIN) 500 MG tablet Take 1 tablet (500 mg total) by mouth every 6 (six) hours as needed for muscle spasms. 06/19/18   Roseanne Kaufman, MD  ?metoprolol succinate (TOPROL-XL) 100 MG 24 hr tablet Take by mouth. 03/05/20   [provider]  ?Omega-3 Fatty Acids (FISH OIL) 1000 MG CAPS Take 1,000 mg by mouth daily.    [provider]  ?OXcarbazepine (TRILEPTAL) 150 MG tablet Take 150 mg by mouth 2 (two) times daily. 04/08/20   [provider]  ?oxyCODONE 10 MG TABS Take 0.5 tablets (5 mg total) by mouth every 4 (four) hours as needed for severe pain (pain score 7-10). 06/19/18   Roseanne Kaufman, MD  ?predniSONE (DELTASONE) 10 MG tablet Take 4 tablets (40 mg total) by mouth daily. 09/25/20   Fredia Sorrow, MD  ?traMADol (ULTRAM) 50 MG tablet Take 50 mg by mouth 4 (four) times daily as needed. 05/02/20   [provider]  ?traZODone (DESYREL) 50 MG tablet Take 50-100 mg by mouth  at bedtime as needed. 04/17/20   [provider]  ?   ? ?Allergies    ?Ace inhibitors, Penicillins, and Sulfa antibiotics   ? ?Review of Systems   ?Review of Systems ? ?Physical Exam ?Updated Vital Signs ?BP (!) 146/108   Pulse 97   Temp 98.9 ?F (37.2 ?C) (Oral)   Resp 16   Ht 5\' 3"  (1.6 m)   Wt 49.9 kg   SpO2 97%   BMI 19.49 kg/m?  ?Physical Exam ?Vitals and nursing note reviewed.  ?Constitutional:   ?   General: She is not in acute distress. ?   Appearance: She is well-developed. She is not diaphoretic.  ?HENT:  ?   Head: Normocephalic and atraumatic.  ?Eyes:  ?   Conjunctiva/sclera: Conjunctivae normal.  ?Cardiovascular:  ?   Rate and Rhythm: Normal rate and regular rhythm.  ?   Heart sounds: Normal heart sounds. No murmur heard. ?  No friction rub. No gallop.  ?Pulmonary:  ?   Effort: Pulmonary effort is normal. No respiratory distress.  ?   Breath sounds: Normal breath sounds. No wheezing or rales.  ?Abdominal:  ?   General: There is distension.  ?   Palpations: Abdomen is soft.  ?   Tenderness: There is abdominal tenderness (bladder distention). There is no guarding.  ?Musculoskeletal:     ?   General: No tenderness.  ?   Cervical back: Normal range of motion.  ?Skin: ?   General: Skin is warm and dry.  ?   Findings: No erythema or rash.  ?Neurological:  ?   Mental Status: She is alert and oriented to person, place, and time.  ? ? ?ED Results / Procedures / Treatments   ?Labs ?(all labs ordered are listed, but only abnormal results are displayed) ?Labs Reviewed  ?CBC WITH DIFFERENTIAL/PLATELET - Abnormal; Notable for the following components:  ?    Result Value  ? RBC 5.20 (*)   ? Hemoglobin 17.7 (*)   ? HCT 50.6 (*)   ? RDW 16.4 (*)   ? Monocytes Absolute 1.9 (*)   ? All other components within normal limits  ?COMPREHENSIVE METABOLIC PANEL - Abnormal; Notable for the following components:  ? Potassium 2.6 (*)   ? Glucose, Bld 152 (*)   ? Creatinine, Ser 1.53 (*)   ? Total Protein 8.5 (*)   ?  GFR, Estimated 38 (*)   ? All other components within normal limits  ?URINALYSIS, ROUTINE W REFLEX MICROSCOPIC - Abnormal; Notable for the following components:  ? APPearance CLOUDY (*)   ? Hgb urine dipstick MODERATE (*)   ? Protein, ur 100 (*)   ? Nitrite POSITIVE (*)   ? Leukocytes,Ua MODERATE (*)   ? All other components within normal limits  ?URINALYSIS, MICROSCOPIC (REFLEX) - Abnormal; Notable for the following components:  ?  Bacteria, UA MANY (*)   ? All other components within normal limits  ?RESP PANEL BY RT-PCR (FLU A&B, COVID) [id number]  ?CULTURE, BLOOD (ROUTINE X 2)  ?CULTURE, BLOOD (ROUTINE X 2)  ?LIPASE, BLOOD  ?MAGNESIUM  ?LACTIC ACID, PLASMA  ? ? ?EKG ?None ? ?Radiology ?CT Head Wo Contrast ? ?Result Date: 06/21/2021 ?CLINICAL DATA:  Head trauma. EXAM: CT HEAD WITHOUT CONTRAST TECHNIQUE: Contiguous axial images were obtained from the base of the skull through the vertex without intravenous contrast. RADIATION DOSE REDUCTION: This exam was performed according to the departmental dose-optimization program which includes automated exposure control, adjustment of the mA and/or kV according to patient size and/or use of iterative reconstruction technique. COMPARISON:  September 25, 2020 FINDINGS: Brain: No evidence of acute infarction, hemorrhage, hydrocephalus, extra-axial collection or mass lesion/mass effect. Stable remote medial left occipital lobe infarct. Moderate deep white matter microangiopathy. Vascular: Calcific atherosclerotic disease of the intra cavernous carotid arteries. Skull: Normal. Negative for fracture or focal lesion. Sinuses/Orbits: No acute finding. Other: None. IMPRESSION: 1. No acute intracranial abnormality. 2. Stable remote medial left occipital lobe infarct. 3. Moderate deep white matter microangiopathy. Electronically Signed   By: Fidela Salisbury M.D.   On: 06/21/2021 19:06  ? ?CT ABDOMEN PELVIS W CONTRAST ? ?Result Date: 06/21/2021 ?CLINICAL DATA:  RLQ abdominal pain (Age >= 14y).  Four falls last night EXAM: CT ABDOMEN AND PELVIS WITH CONTRAST TECHNIQUE: Multidetector CT imaging of the abdomen and pelvis was performed using the standard protocol following bolus administration of intrave

## 2021-06-21 NOTE — ED Triage Notes (Signed)
Pt arrives pov, to triage in wheelchair, reports 4 falls last night, increase in falls. Denies loc ?

## 2021-06-21 NOTE — ED Notes (Signed)
Attempted to call report; this RN contact number provided for call back ?

## 2021-07-30 ENCOUNTER — Emergency Department (HOSPITAL_BASED_OUTPATIENT_CLINIC_OR_DEPARTMENT_OTHER)
Admission: EM | Admit: 2021-07-30 | Discharge: 2021-07-30 | Disposition: A | Discharge status: Home / Self Care | Payer: Medicare HMO | Attending: Emergency Medicine | Admitting: Emergency Medicine

## 2021-07-30 ENCOUNTER — Encounter (HOSPITAL_BASED_OUTPATIENT_CLINIC_OR_DEPARTMENT_OTHER): Payer: Self-pay

## 2021-07-30 ENCOUNTER — Other Ambulatory Visit: Payer: Self-pay

## 2021-07-30 DIAGNOSIS — I10 Essential (primary) hypertension: Secondary | ICD-10-CM | POA: Insufficient documentation

## 2021-07-30 DIAGNOSIS — Z7982 Long term (current) use of aspirin: Secondary | ICD-10-CM | POA: Insufficient documentation

## 2021-07-30 DIAGNOSIS — R1030 Lower abdominal pain, unspecified: Secondary | ICD-10-CM | POA: Insufficient documentation

## 2021-07-30 DIAGNOSIS — R339 Retention of urine, unspecified: Secondary | ICD-10-CM

## 2021-07-30 DIAGNOSIS — F17200 Nicotine dependence, unspecified, uncomplicated: Secondary | ICD-10-CM | POA: Diagnosis not present

## 2021-07-30 DIAGNOSIS — Z79899 Other long term (current) drug therapy: Secondary | ICD-10-CM | POA: Diagnosis not present

## 2021-07-30 LAB — URINALYSIS, ROUTINE W REFLEX MICROSCOPIC
Bilirubin Urine: NEGATIVE
Glucose, UA: NEGATIVE mg/dL
Ketones, ur: NEGATIVE mg/dL
Nitrite: NEGATIVE
Protein, ur: NEGATIVE mg/dL
Specific Gravity, Urine: 1.01 (ref 1.005–1.030)
pH: 6 (ref 5.0–8.0)

## 2021-07-30 LAB — URINALYSIS, MICROSCOPIC (REFLEX)

## 2021-07-30 NOTE — ED Provider Notes (Signed)
[facility] Provider Note   CSN: [id number] Arrival date & time: 07/30/21  1006     History  Chief Complaint  Patient presents with   Urinary Retention    Shawna Smith is a 66 y.o. female.  Pt is a 66 yo female with a pmhx significant for CVA, MS, trigeminal neuralgia, htn, urinary retention and tobacco abuse.  Pt came here on 4/22 and was found to be in urinary retention.  She had a UTI and pna and was transferred to [facility].  Pt's foley was removed yesterday. She said she's been unable to urinate since it was removed.  She said a few drops have come out, but nothing significant.  Pt has a lot of lower abd discomfort and feels that she needs to urinate.         Home Medications Prior to Admission medications   Medication Sig Start Date End Date Taking? Authorizing Provider  albuterol (VENTOLIN HFA) 108 (90 Base) MCG/ACT inhaler Inhale 2 puffs into the lungs every 4 (four) hours as needed for wheezing. 04/25/15   [provider]  amitriptyline (ELAVIL) 50 MG tablet Take 1 tablet by mouth at bedtime. 03/13/19   [provider]  amLODipine (NORVASC) 10 MG tablet Take 10 mg by mouth daily. 05/02/20   [provider]  ascorbic Acid (VITAMIN C) 500 MG CPCR Take 500 mg by mouth daily.    [provider]  aspirin 81 MG EC tablet Take 81 mg by mouth daily. 04/25/15   [provider]  AUBAGIO 14 MG TABS Take 14 mg by mouth daily. 05/17/20   [provider]  Calcium Carb-Ergocalciferol 500-200 MG-UNIT TABS Take 1 tablet by mouth daily.    [provider]  cholecalciferol (VITAMIN D3) 25 MCG (1000 UNIT) tablet Take 1,000 Units by mouth daily.    [provider]  Ensure Plus (ENSURE PLUS) LIQD Take 237 mLs by mouth 3 (three) times daily between meals.    [provider]  EPINEPHrine 0.3 mg/0.3 mL IJ SOAJ injection Inject 0.3 mg into the muscle once as needed for anaphylaxis.    [provider]  gabapentin (NEURONTIN) 300 MG capsule Take 900 mg by mouth 2 (two) times daily.    [provider]  losartan-hydrochlorothiazide (HYZAAR) 50-12.5 MG tablet Take 1 tablet by mouth daily. 03/05/20   [provider]  methocarbamol (ROBAXIN) 500 MG tablet Take 1 tablet (500 mg total) by mouth every 6 (six) hours as needed for muscle spasms. 06/19/18   Roseanne Kaufman, MD  metoprolol succinate (TOPROL-XL) 100 MG 24 hr tablet Take by mouth. 03/05/20   [provider]  Omega-3 Fatty Acids (FISH OIL) 1000 MG CAPS Take 1,000 mg by mouth daily.    [provider]  OXcarbazepine (TRILEPTAL) 150 MG tablet Take 150 mg by mouth 2 (two) times daily. 04/08/20   [provider]  oxyCODONE 10 MG TABS Take 0.5 tablets (5 mg total) by mouth every 4 (four) hours as needed for severe pain (pain score 7-10). 06/19/18   Roseanne Kaufman, MD  predniSONE (DELTASONE) 10 MG tablet Take 4 tablets (40 mg total) by mouth daily. 09/25/20   Fredia Sorrow, MD  traMADol (ULTRAM) 50 MG tablet Take 50 mg by mouth 4 (four) times daily as needed. 05/02/20   [provider]  traZODone (DESYREL) 50 MG tablet Take 50-100 mg by mouth at bedtime as needed. 04/17/20   [provider]  Allergies    Ace inhibitors, Penicillins, and Sulfa antibiotics    Review of Systems   Review of Systems  Genitourinary:  Positive for difficulty urinating.  All other systems reviewed and are negative.  Physical Exam Updated Vital Signs BP (!) 174/88   Pulse 90   Temp 98.5 F (36.9 C) (Oral)   Resp 17   Ht 5\' 3"  (1.6 m)   Wt 49.9 kg   SpO2 90%   BMI 19.49 kg/m  Physical Exam Vitals and nursing note reviewed.  Constitutional:      Appearance: Normal appearance.  HENT:     Head: Normocephalic and atraumatic.     Right Ear: External ear normal.     Left Ear: External ear normal.     Nose: Nose normal.     Mouth/Throat:     Mouth: Mucous membranes are moist.      Pharynx: Oropharynx is clear.  Eyes:     Extraocular Movements: Extraocular movements intact.     Conjunctiva/sclera: Conjunctivae normal.     Pupils: Pupils are equal, round, and reactive to light.  Cardiovascular:     Rate and Rhythm: Normal rate and regular rhythm.     Pulses: Normal pulses.     Heart sounds: Normal heart sounds.  Pulmonary:     Effort: Pulmonary effort is normal.     Breath sounds: Normal breath sounds.  Abdominal:     General: Abdomen is flat. Bowel sounds are normal.     Tenderness: There is abdominal tenderness in the suprapubic area.     Comments: Bladder is enlarged and tender  Musculoskeletal:        General: Normal range of motion.     Cervical back: Normal range of motion and neck supple.  Skin:    General: Skin is warm.     Capillary Refill: Capillary refill takes less than 2 seconds.  Neurological:     General: No focal deficit present.     Mental Status: She is alert and oriented to person, place, and time.  Psychiatric:        Mood and Affect: Mood normal.        Behavior: Behavior normal.    ED Results / Procedures / Treatments   Labs (all labs ordered are listed, but only abnormal results are displayed) Labs Reviewed  URINALYSIS, ROUTINE W REFLEX MICROSCOPIC - Abnormal; Notable for the following components:      Result Value   APPearance HAZY (*)    Hgb urine dipstick TRACE (*)    Leukocytes,Ua SMALL (*)    All other components within normal limits  URINALYSIS, MICROSCOPIC (REFLEX) - Abnormal; Notable for the following components:   Bacteria, UA MANY (*)    All other components within normal limits  URINE CULTURE    EKG None  Radiology No results found.  Procedures Procedures    Medications Ordered in ED Medications - No data to display  ED Course/ Medical Decision Making/ A&P                           Medical Decision Making Amount and/or Complexity of Data Reviewed Labs: ordered.   This patient presents to the ED  for concern of urinary retention, this involves an extensive number of treatment options, and is a complaint that carries with it a high risk of complications and morbidity.  The differential diagnosis includes retention, uti   Co morbidities that complicate the patient  evaluation  CVA, MS, trigeminal neuralgia, htn, urinary retention and tobacco abuse   Additional history obtained:  Additional history obtained from epic chart review External records from outside source obtained and reviewed including sister   Lab Tests:  I Ordered, and personally interpreted labs.  The pertinent results include:  UA with 6-10 wbcs and many back.  Only small LE.   Medicines ordered and prescription drug management:  I have reviewed the patients home medicines and have made adjustments as needed  Critical Interventions:  foley  Problem List / ED Course:  Urinary retention:  Foley placed with over 1L urine drained.  Pt feels much better.  UA sent for culture.  Pt d/c with leg bag.  She is to f/u with her urologist.   Reevaluation:  After the interventions noted above, I reevaluated the patient and found that they have :improved   Social Determinants of Health:  Lives at home with family   Dispostion:  After consideration of the diagnostic results and the patients response to treatment, I feel that the patent would benefit from discharge with outpatient f/u.          Final Clinical Impression(s) / ED Diagnoses Final diagnoses:  Urinary retention    Rx / DC Orders ED Discharge Orders     None         Isla Pence, MD 07/30/21 1143

## 2021-07-30 NOTE — ED Triage Notes (Signed)
States had urinary catheter placed a week ago. Had it removed yesterday. States was urinating yesterday but today unable to get much urine out, only "dribbles". C/o abdominal bloating and feels like she needs to urinate.

## 2021-07-30 NOTE — ED Notes (Signed)
Leg bag placed on foley, pt instruction given, no further questions

## 2021-08-01 LAB — URINE CULTURE: Culture: >=100000 — AB

## 2021-08-02 ENCOUNTER — Telehealth (HOSPITAL_BASED_OUTPATIENT_CLINIC_OR_DEPARTMENT_OTHER): Payer: Self-pay | Admitting: *Deleted

## 2021-08-02 NOTE — Telephone Encounter (Signed)
Post ED Visit - Positive Culture Follow-up: Successful Patient Follow-Up  Culture assessed and recommendations reviewed by:  [x]  , Pharm.D. []  Delmar Landau, Pharm.D., BCPS AQ-ID []  , Pharm.D., BCPS []  Celedonio Miyamoto, Pharm.D., BCPS []  [place], Garvin Fila.D., BCPS, AAHIVP []  , Pharm.D., BCPS, AAHIVP []  Georgina Pillion, PharmD, BCPS []  , PharmD, BCPS []  [place], PharmD, BCPS []  [address], PharmD  Positive urine culture  [x]  Patient discharged without antimicrobial prescription and treatment is now indicated []  Organism is resistant to prescribed ED discharge antimicrobial []  Patient with positive blood cultures  Changes discussed with ED provider: , MD New antibiotic prescription Cefdinir 300mg  BIDx 5 days Called to [facility] [address]. [place],  Contacted patient, date 08/02/21, time 1423   08/02/2021, 2:23 PM

## 2021-08-02 NOTE — Progress Notes (Signed)
ED Antimicrobial Stewardship Positive Culture Follow Up   Shawna Smith is an 66 y.o. female who presented to [facility] on 07/30/2021 with a chief complaint of  Chief Complaint  Patient presents with   Urinary Retention    Recent Results (from the past 720 hour(s))  Urine Culture     Status: Abnormal   Collection Time: 07/30/21 10:55 AM   Specimen: Urine, Catheterized  Result Value Ref Range Status   Specimen Description   Final    URINE, CATHETERIZED Performed at [facility], [address] [place], [place] [address]    Special Requests   Final    NONE Performed at [facility], [address] [place], [place] [address]    Culture >=100,000 COLONIES/mL KLEBSIELLA PNEUMONIAE (A)  Final   Report Status 08/01/2021 FINAL  Final   Organism ID, Bacteria KLEBSIELLA PNEUMONIAE (A)  Final      Susceptibility   Klebsiella pneumoniae - MIC*    AMPICILLIN >=32 RESISTANT Resistant     CEFAZOLIN <=4 SENSITIVE Sensitive     CEFEPIME <=0.12 SENSITIVE Sensitive     CEFTRIAXONE <=0.25 SENSITIVE Sensitive     CIPROFLOXACIN <=0.25 SENSITIVE Sensitive     GENTAMICIN <=1 SENSITIVE Sensitive     IMIPENEM <=0.25 SENSITIVE Sensitive     NITROFURANTOIN 128 RESISTANT Resistant     TRIMETH/SULFA <=20 SENSITIVE Sensitive     AMPICILLIN/SULBACTAM 4 SENSITIVE Sensitive     PIP/TAZO <=4 SENSITIVE Sensitive     * >=100,000 COLONIES/mL KLEBSIELLA PNEUMONIAE    [x]  Patient with documented follow-up of no urinary symptoms. However, patient with recent foley change, which I will consider to meet IDSA's genitourinary procedure exclusion for asymptomatic bacteriuria recommendations against empiric antibiotics. Patient has been told to follow-up with urology but no appointment has been made that I can confirm in chart. Will provide short abx course and remind patient need for urology follow-up.  New antibiotic prescription: cefdinir 300mg  BID x 5 days (Qty 10; Refills 0)  ED Provider: Deno Etienne MD   Lorelei Pont, PharmD, BCPS 08/02/2021 11:11 AM ED Clinical Pharmacist -  [phone]

## 2021-10-22 ENCOUNTER — Emergency Department (HOSPITAL_BASED_OUTPATIENT_CLINIC_OR_DEPARTMENT_OTHER): Payer: Medicare HMO

## 2021-10-22 ENCOUNTER — Encounter (HOSPITAL_BASED_OUTPATIENT_CLINIC_OR_DEPARTMENT_OTHER): Payer: Self-pay

## 2021-10-22 ENCOUNTER — Inpatient Hospital Stay (HOSPITAL_BASED_OUTPATIENT_CLINIC_OR_DEPARTMENT_OTHER)
Admission: EM | Admit: 2021-10-22 | Discharge: 2021-10-27 | DRG: 698 | Disposition: A | Discharge status: Home Health | Payer: Medicare HMO | Attending: Internal Medicine | Admitting: Internal Medicine

## 2021-10-22 ENCOUNTER — Other Ambulatory Visit: Payer: Self-pay

## 2021-10-22 ENCOUNTER — Encounter (HOSPITAL_COMMUNITY): Payer: Self-pay

## 2021-10-22 DIAGNOSIS — F17210 Nicotine dependence, cigarettes, uncomplicated: Secondary | ICD-10-CM | POA: Diagnosis present

## 2021-10-22 DIAGNOSIS — Y732 Prosthetic and other implants, materials and accessory gastroenterology and urology devices associated with adverse incidents: Secondary | ICD-10-CM | POA: Diagnosis present

## 2021-10-22 DIAGNOSIS — T83511A Infection and inflammatory reaction due to indwelling urethral catheter, initial encounter: Principal | ICD-10-CM | POA: Diagnosis present

## 2021-10-22 DIAGNOSIS — R948 Abnormal results of function studies of other organs and systems: Secondary | ICD-10-CM | POA: Diagnosis present

## 2021-10-22 DIAGNOSIS — N390 Urinary tract infection, site not specified: Secondary | ICD-10-CM | POA: Diagnosis not present

## 2021-10-22 DIAGNOSIS — R339 Retention of urine, unspecified: Principal | ICD-10-CM

## 2021-10-22 DIAGNOSIS — Y846 Urinary catheterization as the cause of abnormal reaction of the patient, or of later complication, without mention of misadventure at the time of the procedure: Secondary | ICD-10-CM | POA: Diagnosis present

## 2021-10-22 DIAGNOSIS — Z7952 Long term (current) use of systemic steroids: Secondary | ICD-10-CM

## 2021-10-22 DIAGNOSIS — N1831 Chronic kidney disease, stage 3a: Secondary | ICD-10-CM | POA: Diagnosis present

## 2021-10-22 DIAGNOSIS — T83018A Breakdown (mechanical) of other indwelling urethral catheter, initial encounter: Secondary | ICD-10-CM | POA: Diagnosis present

## 2021-10-22 DIAGNOSIS — Z882 Allergy status to sulfonamides status: Secondary | ICD-10-CM

## 2021-10-22 DIAGNOSIS — Z880 Allergy status to penicillin: Secondary | ICD-10-CM

## 2021-10-22 DIAGNOSIS — Z7982 Long term (current) use of aspirin: Secondary | ICD-10-CM

## 2021-10-22 DIAGNOSIS — E876 Hypokalemia: Secondary | ICD-10-CM | POA: Diagnosis not present

## 2021-10-22 DIAGNOSIS — E871 Hypo-osmolality and hyponatremia: Secondary | ICD-10-CM | POA: Diagnosis present

## 2021-10-22 DIAGNOSIS — Z79899 Other long term (current) drug therapy: Secondary | ICD-10-CM

## 2021-10-22 DIAGNOSIS — J9811 Atelectasis: Secondary | ICD-10-CM | POA: Diagnosis present

## 2021-10-22 DIAGNOSIS — J9601 Acute respiratory failure with hypoxia: Secondary | ICD-10-CM | POA: Diagnosis present

## 2021-10-22 DIAGNOSIS — G35 Multiple sclerosis: Secondary | ICD-10-CM | POA: Diagnosis present

## 2021-10-22 DIAGNOSIS — Z8673 Personal history of transient ischemic attack (TIA), and cerebral infarction without residual deficits: Secondary | ICD-10-CM

## 2021-10-22 DIAGNOSIS — Z720 Tobacco use: Secondary | ICD-10-CM | POA: Diagnosis present

## 2021-10-22 DIAGNOSIS — Z20822 Contact with and (suspected) exposure to covid-19: Secondary | ICD-10-CM | POA: Diagnosis present

## 2021-10-22 DIAGNOSIS — E872 Acidosis, unspecified: Secondary | ICD-10-CM | POA: Diagnosis present

## 2021-10-22 DIAGNOSIS — J189 Pneumonia, unspecified organism: Secondary | ICD-10-CM | POA: Diagnosis present

## 2021-10-22 DIAGNOSIS — I129 Hypertensive chronic kidney disease with stage 1 through stage 4 chronic kidney disease, or unspecified chronic kidney disease: Secondary | ICD-10-CM | POA: Diagnosis present

## 2021-10-22 DIAGNOSIS — G4700 Insomnia, unspecified: Secondary | ICD-10-CM | POA: Diagnosis present

## 2021-10-22 DIAGNOSIS — Z888 Allergy status to other drugs, medicaments and biological substances status: Secondary | ICD-10-CM

## 2021-10-22 DIAGNOSIS — A419 Sepsis, unspecified organism: Secondary | ICD-10-CM

## 2021-10-22 LAB — CBC WITH DIFFERENTIAL/PLATELET
Abs Immature Granulocytes: 0.04 10*3/uL (ref 0.00–0.07)
Basophils Absolute: 0.1 10*3/uL (ref 0.0–0.1)
Basophils Relative: 0 %
Eosinophils Absolute: 0 10*3/uL (ref 0.0–0.5)
Eosinophils Relative: 0 %
HCT: 43.2 % (ref 36.0–46.0)
Hemoglobin: 16 g/dL — ABNORMAL HIGH (ref 12.0–15.0)
Immature Granulocytes: 0 %
Lymphocytes Relative: 3 %
Lymphs Abs: 0.3 10*3/uL — ABNORMAL LOW (ref 0.7–4.0)
MCH: 35.8 pg — ABNORMAL HIGH (ref 26.0–34.0)
MCHC: 37 g/dL — ABNORMAL HIGH (ref 30.0–36.0)
MCV: 96.6 fL (ref 80.0–100.0)
Monocytes Absolute: 0.5 10*3/uL (ref 0.1–1.0)
Monocytes Relative: 4 %
Neutro Abs: 10.4 10*3/uL — ABNORMAL HIGH (ref 1.7–7.7)
Neutrophils Relative %: 93 %
Platelets: 282 10*3/uL (ref 150–400)
RBC: 4.47 MIL/uL (ref 3.87–5.11)
RDW: 15.7 % — ABNORMAL HIGH (ref 11.5–15.5)
WBC: 11.3 10*3/uL — ABNORMAL HIGH (ref 4.0–10.5)
nRBC: 0 % (ref 0.0–0.2)

## 2021-10-22 LAB — URINALYSIS, MICROSCOPIC (REFLEX): WBC, UA: >50 WBC/hpf (ref 0–5)

## 2021-10-22 LAB — URINALYSIS, ROUTINE W REFLEX MICROSCOPIC
Bilirubin Urine: NEGATIVE
Glucose, UA: NEGATIVE mg/dL
Ketones, ur: NEGATIVE mg/dL
Nitrite: NEGATIVE
Protein, ur: >=300 mg/dL — AB
Specific Gravity, Urine: 1.02 (ref 1.005–1.030)
pH: 8.5 — ABNORMAL HIGH (ref 5.0–8.0)

## 2021-10-22 LAB — BRAIN NATRIURETIC PEPTIDE: B Natriuretic Peptide: 149.6 pg/mL — ABNORMAL HIGH (ref 0.0–100.0)

## 2021-10-22 LAB — COMPREHENSIVE METABOLIC PANEL
ALT: 14 U/L (ref 0–44)
AST: 25 U/L (ref 15–41)
Albumin: 4.3 g/dL (ref 3.5–5.0)
Alkaline Phosphatase: 80 U/L (ref 38–126)
Anion gap: 14 (ref 5–15)
BUN: 24 mg/dL — ABNORMAL HIGH (ref 8–23)
CO2: 25 mmol/L (ref 22–32)
Calcium: 10.6 mg/dL — ABNORMAL HIGH (ref 8.9–10.3)
Chloride: 92 mmol/L — ABNORMAL LOW (ref 98–111)
Creatinine, Ser: 1.51 mg/dL — ABNORMAL HIGH (ref 0.44–1.00)
GFR, Estimated: 39 mL/min — ABNORMAL LOW (ref >60)
Glucose, Bld: 157 mg/dL — ABNORMAL HIGH (ref 70–99)
Potassium: 4.2 mmol/L (ref 3.5–5.1)
Sodium: 131 mmol/L — ABNORMAL LOW (ref 135–145)
Total Bilirubin: 0.9 mg/dL (ref 0.3–1.2)
Total Protein: 8.1 g/dL (ref 6.5–8.1)

## 2021-10-22 LAB — SARS CORONAVIRUS 2 BY RT PCR: SARS Coronavirus 2 by RT PCR: NEGATIVE

## 2021-10-22 LAB — TROPONIN I (HIGH SENSITIVITY)
Troponin I (High Sensitivity): 8 ng/L (ref <18)
Troponin I (High Sensitivity): 9 ng/L (ref <18)

## 2021-10-22 LAB — LIPASE, BLOOD: Lipase: 74 U/L — ABNORMAL HIGH (ref 11–51)

## 2021-10-22 LAB — LACTIC ACID, PLASMA: Lactic Acid, Venous: 3.1 mmol/L (ref 0.5–1.9)

## 2021-10-22 MED ORDER — IOHEXOL 350 MG/ML SOLN
100.0000 mL | Freq: Once | INTRAVENOUS | Status: AC | PRN
  Administered 2021-10-22: 100 mL via INTRAVENOUS

## 2021-10-22 MED ORDER — LACTATED RINGERS IV BOLUS
1000.0000 mL | Freq: Once | INTRAVENOUS | Status: AC
  Administered 2021-10-22: 1000 mL via INTRAVENOUS

## 2021-10-22 MED ORDER — SODIUM CHLORIDE 0.9 % IV SOLN
1.0000 g | Freq: Once | INTRAVENOUS | Status: AC
  Administered 2021-10-22: 1 g via INTRAVENOUS
  Filled 2021-10-22: qty 10

## 2021-10-22 MED ORDER — FENTANYL CITRATE PF 50 MCG/ML IJ SOSY
50.0000 ug | PREFILLED_SYRINGE | Freq: Once | INTRAMUSCULAR | Status: AC
  Administered 2021-10-22: 50 ug via INTRAVENOUS
  Filled 2021-10-22: qty 1

## 2021-10-22 MED ORDER — SODIUM CHLORIDE 0.9 % IV SOLN
100.0000 mg | Freq: Once | INTRAVENOUS | Status: AC
  Administered 2021-10-22: 100 mg via INTRAVENOUS
  Filled 2021-10-22: qty 100

## 2021-10-22 NOTE — ED Provider Notes (Signed)
[facility] Provider Note   CSN: [id number] Arrival date & time: 10/22/21  1431     History {Add pertinent medical, surgical, social history, OB history to HPI:1} Chief Complaint  Patient presents with   Abdominal Pain    Shawna Smith is a 66 y.o. female.  HPI     66yo female with history of MS, relapsing remitting subtype, presumptive trigeminal neuralgia, insomnia, tobacco use, left occipital CVA, urinary retention with catheter in place over last 3 month who presents with concern for urinary retention and abdominal pain.  Reports that her catheter stopped working yesterday and has not been draining any urine.  Notes abdominal pain, nausea, vomiting, lightheadedness.    Past Medical History:  Diagnosis Date   Hypertension    MS (multiple sclerosis) (HCC)    Osteoporosis      Home Medications Prior to Admission medications   Medication Sig Start Date End Date Taking? Authorizing Provider  albuterol (VENTOLIN HFA) 108 (90 Base) MCG/ACT inhaler Inhale 2 puffs into the lungs every 4 (four) hours as needed for wheezing. 04/25/15   [provider]  amitriptyline (ELAVIL) 50 MG tablet Take 1 tablet by mouth at bedtime. 03/13/19   [provider]  amLODipine (NORVASC) 10 MG tablet Take 10 mg by mouth daily. 05/02/20   [provider]  ascorbic Acid (VITAMIN C) 500 MG CPCR Take 500 mg by mouth daily.    [provider]  aspirin 81 MG EC tablet Take 81 mg by mouth daily. 04/25/15   [provider]  AUBAGIO 14 MG TABS Take 14 mg by mouth daily. 05/17/20   [provider]  Calcium Carb-Ergocalciferol 500-200 MG-UNIT TABS Take 1 tablet by mouth daily.    [provider]  cholecalciferol (VITAMIN D3) 25 MCG (1000 UNIT) tablet Take 1,000 Units by mouth daily.    [provider]  Ensure Plus (ENSURE PLUS) LIQD Take 237 mLs by mouth 3 (three) times daily between meals.    [provider]  EPINEPHrine 0.3 mg/0.3 mL IJ SOAJ injection Inject 0.3 mg into the muscle once as needed for anaphylaxis.    [provider]  gabapentin (NEURONTIN) 300 MG capsule Take 900 mg by mouth 2 (two) times daily.    [provider]  losartan-hydrochlorothiazide (HYZAAR) 50-12.5 MG tablet Take 1 tablet by mouth daily. 03/05/20   [provider]  methocarbamol (ROBAXIN) 500 MG tablet Take 1 tablet (500 mg total) by mouth every 6 (six) hours as needed for muscle spasms. 06/19/18   Dominica Severin, MD  metoprolol succinate (TOPROL-XL) 100 MG 24 hr tablet Take by mouth. 03/05/20   [provider]  Omega-3 Fatty Acids (FISH OIL) 1000 MG CAPS Take 1,000 mg by mouth daily.    [provider]  OXcarbazepine (TRILEPTAL) 150 MG tablet Take 150 mg by mouth 2 (two) times daily. 04/08/20   [provider]  oxyCODONE 10 MG TABS Take 0.5 tablets (5 mg total) by mouth every 4 (four) hours as needed for severe pain (pain score 7-10). 06/19/18   Dominica Severin, MD  predniSONE (DELTASONE) 10 MG tablet Take 4 tablets (40 mg total) by mouth daily. 09/25/20   Vanetta Mulders, MD  traMADol (ULTRAM) 50 MG tablet Take 50 mg by mouth 4 (four) times daily as needed. 05/02/20   [provider]  traZODone (DESYREL) 50 MG tablet Take 50-100 mg by mouth at bedtime as needed. 04/17/20   [provider]  Allergies    Ace inhibitors, Penicillins, and Sulfa antibiotics    Review of Systems   Review of Systems  Physical Exam Updated Vital Signs BP (!) 140/86 (BP Location: Right Arm)   Pulse (!) 115   Temp 97.9 F (36.6 C) (Oral)   Resp 20   Ht 5\' 3"  (1.6 m)   Wt 49.9 kg   SpO2 96%   BMI 19.49 kg/m  Physical Exam  ED Results / Procedures / Treatments   Labs (all labs ordered are listed, but only abnormal results are displayed) Labs Reviewed - No data to display  EKG None  Radiology No results found.  Procedures Procedures  {Document cardiac  monitor, telemetry assessment procedure when appropriate:1}  Medications Ordered in ED Medications - No data to display  ED Course/ Medical Decision Making/ A&P                           Medical Decision Making Amount and/or Complexity of Data Reviewed Labs: ordered. Radiology: ordered.  Risk Prescription drug management.   ***  CT PE study completed and personally evaluated and interpreted by me and radiology and shows no evidence of PE, does show some findings which could represent atelectasis or pneumonia.  CT abdomen pelvis completed which shows a short segment of dilated proximal duodenum which may be transient in nature, cholelithiasis, diffuse urinary bladder wall thickening, stable dystrophic calcifications within the head of the pancreas which may represent a pancreatitis, pancreatic ductal dilation which represents a new finding when compared to prior, abrupt change in caliber of the proximal transverse colon without associated mass--per radiology will some of these finding to be transient in nature, short-term follow-up abdomen CT pelvis is recommended.  She did have some abdominal discomfort after her Foley catheter was placed for her urinary retention, however, at this time do not have high suspicion clinically for a small bowel obstruction.  Labs do not suggest choledocholithiasis.  Lipase is ordered and***.  Limited hospital with concern for possible sepsis secondary to urinary source, and lower suspicion for pneumonia.  She has a leukocytosis, lactic acidosis, signs of UTI, and signs of possible pneumonia on CT.  Other etiologies of her lactic acidosis may be dehydration.  Given 2 L of IV fluid, Rocephin, doxycycline.  Urine and blood cultures are pending.  At this time have lower clinical suspicion for small bowel obstruction, cholecystitis, choledocholithiasis/cholangitis.    {Document critical care time when appropriate:1} {Document review of labs and clinical  decision tools ie heart score, Chads2Vasc2 etc:1}  {Document your independent review of radiology images, and any outside records:1} {Document your discussion with family members, caretakers, and with consultants:1} {Document social determinants of health affecting pt's care:1} {Document your decision making why or why not admission, treatments were needed:1} Final Clinical Impression(s) / ED Diagnoses Final diagnoses:  None    Rx / DC Orders ED Discharge Orders     None

## 2021-10-22 NOTE — ED Notes (Signed)
ED provider at bedside to provide pt/family with update.

## 2021-10-22 NOTE — ED Notes (Signed)
Critical lab results: lactic acid 3.1 - Primary RN and ED Provider informed

## 2021-10-22 NOTE — ED Notes (Signed)
I spoke with the patient's sister. She states that this episode is acute and happened around 13-1400 hours this date. She states the pt is usually talking and much more alert.

## 2021-10-22 NOTE — ED Triage Notes (Signed)
Patient states her catheter stopped working yesterday and she is having generalized abd pain pain.    Patients initial saturation was 88% states does not want to take deep breaths due to pain - placed on 2l via o2. Saturations brought up  91%

## 2021-10-23 DIAGNOSIS — Z7952 Long term (current) use of systemic steroids: Secondary | ICD-10-CM | POA: Diagnosis not present

## 2021-10-23 DIAGNOSIS — Y732 Prosthetic and other implants, materials and accessory gastroenterology and urology devices associated with adverse incidents: Secondary | ICD-10-CM | POA: Diagnosis not present

## 2021-10-23 DIAGNOSIS — G4700 Insomnia, unspecified: Secondary | ICD-10-CM | POA: Diagnosis not present

## 2021-10-23 DIAGNOSIS — T83511A Infection and inflammatory reaction due to indwelling urethral catheter, initial encounter: Secondary | ICD-10-CM | POA: Diagnosis not present

## 2021-10-23 DIAGNOSIS — J189 Pneumonia, unspecified organism: Secondary | ICD-10-CM | POA: Diagnosis not present

## 2021-10-23 DIAGNOSIS — I129 Hypertensive chronic kidney disease with stage 1 through stage 4 chronic kidney disease, or unspecified chronic kidney disease: Secondary | ICD-10-CM | POA: Diagnosis not present

## 2021-10-23 DIAGNOSIS — E876 Hypokalemia: Secondary | ICD-10-CM | POA: Diagnosis not present

## 2021-10-23 DIAGNOSIS — Z20822 Contact with and (suspected) exposure to covid-19: Secondary | ICD-10-CM | POA: Diagnosis not present

## 2021-10-23 DIAGNOSIS — N390 Urinary tract infection, site not specified: Secondary | ICD-10-CM

## 2021-10-23 DIAGNOSIS — G35 Multiple sclerosis: Secondary | ICD-10-CM | POA: Diagnosis not present

## 2021-10-23 DIAGNOSIS — J9601 Acute respiratory failure with hypoxia: Secondary | ICD-10-CM | POA: Diagnosis not present

## 2021-10-23 DIAGNOSIS — T83018A Breakdown (mechanical) of other indwelling urethral catheter, initial encounter: Secondary | ICD-10-CM | POA: Diagnosis not present

## 2021-10-23 DIAGNOSIS — F17210 Nicotine dependence, cigarettes, uncomplicated: Secondary | ICD-10-CM | POA: Diagnosis not present

## 2021-10-23 DIAGNOSIS — E871 Hypo-osmolality and hyponatremia: Secondary | ICD-10-CM | POA: Diagnosis not present

## 2021-10-23 DIAGNOSIS — Z888 Allergy status to other drugs, medicaments and biological substances status: Secondary | ICD-10-CM | POA: Diagnosis not present

## 2021-10-23 DIAGNOSIS — R948 Abnormal results of function studies of other organs and systems: Secondary | ICD-10-CM | POA: Diagnosis not present

## 2021-10-23 DIAGNOSIS — E872 Acidosis, unspecified: Secondary | ICD-10-CM | POA: Diagnosis not present

## 2021-10-23 DIAGNOSIS — Z79899 Other long term (current) drug therapy: Secondary | ICD-10-CM | POA: Diagnosis not present

## 2021-10-23 DIAGNOSIS — Z882 Allergy status to sulfonamides status: Secondary | ICD-10-CM | POA: Diagnosis not present

## 2021-10-23 DIAGNOSIS — Z8673 Personal history of transient ischemic attack (TIA), and cerebral infarction without residual deficits: Secondary | ICD-10-CM | POA: Diagnosis not present

## 2021-10-23 DIAGNOSIS — Z880 Allergy status to penicillin: Secondary | ICD-10-CM | POA: Diagnosis not present

## 2021-10-23 DIAGNOSIS — J9811 Atelectasis: Secondary | ICD-10-CM | POA: Diagnosis not present

## 2021-10-23 DIAGNOSIS — Y846 Urinary catheterization as the cause of abnormal reaction of the patient, or of later complication, without mention of misadventure at the time of the procedure: Secondary | ICD-10-CM | POA: Diagnosis not present

## 2021-10-23 DIAGNOSIS — Z7982 Long term (current) use of aspirin: Secondary | ICD-10-CM | POA: Diagnosis not present

## 2021-10-23 DIAGNOSIS — N1831 Chronic kidney disease, stage 3a: Secondary | ICD-10-CM | POA: Diagnosis not present

## 2021-10-23 LAB — LACTIC ACID, PLASMA: Lactic Acid, Venous: 1.7 mmol/L (ref 0.5–1.9)

## 2021-10-23 MED ORDER — LACTATED RINGERS IV SOLN: INTRAVENOUS | Status: DC

## 2021-10-23 MED ORDER — METOPROLOL SUCCINATE ER 50 MG PO TB24
50.0000 mg | ORAL_TABLET | Freq: Every day | ORAL | Status: DC
  Administered 2021-10-23 – 2021-10-26 (×4): 50 mg via ORAL
  Filled 2021-10-23 (×4): qty 1

## 2021-10-23 MED ORDER — ACETAMINOPHEN 500 MG PO TABS
1000.0000 mg | ORAL_TABLET | Freq: Once | ORAL | Status: AC
  Administered 2021-10-23: 1000 mg via ORAL
  Filled 2021-10-23: qty 2

## 2021-10-23 MED ORDER — ACETAMINOPHEN 325 MG PO TABS: 650.0000 mg | ORAL_TABLET | Freq: Four times a day (QID) | ORAL | Status: DC | PRN

## 2021-10-23 MED ORDER — HYDROCODONE-ACETAMINOPHEN 5-325 MG PO TABS
1.0000 | ORAL_TABLET | ORAL | Status: DC | PRN
  Administered 2021-10-23 – 2021-10-27 (×9): 2 via ORAL
  Filled 2021-10-23 (×9): qty 2

## 2021-10-23 MED ORDER — ASPIRIN 81 MG PO TBEC
81.0000 mg | DELAYED_RELEASE_TABLET | Freq: Every day | ORAL | Status: DC
  Administered 2021-10-24 – 2021-10-27 (×4): 81 mg via ORAL
  Filled 2021-10-23 (×4): qty 1

## 2021-10-23 MED ORDER — IPRATROPIUM-ALBUTEROL 0.5-2.5 (3) MG/3ML IN SOLN
6.0000 mL | Freq: Once | RESPIRATORY_TRACT | Status: AC
  Administered 2021-10-23: 6 mL via RESPIRATORY_TRACT
  Filled 2021-10-23: qty 6

## 2021-10-23 MED ORDER — ENOXAPARIN SODIUM 30 MG/0.3ML IJ SOSY
30.0000 mg | PREFILLED_SYRINGE | INTRAMUSCULAR | Status: DC
  Administered 2021-10-23: 30 mg via SUBCUTANEOUS
  Filled 2021-10-23: qty 0.3

## 2021-10-23 MED ORDER — TRAZODONE HCL 50 MG PO TABS
50.0000 mg | ORAL_TABLET | Freq: Every evening | ORAL | Status: DC | PRN
Start: 2021-10-23 — End: 2021-10-27
  Administered 2021-10-24 – 2021-10-25 (×2): 50 mg via ORAL
  Filled 2021-10-23 (×2): qty 1

## 2021-10-23 MED ORDER — DOXYCYCLINE HYCLATE 100 MG IV SOLR
100.0000 mg | Freq: Two times a day (BID) | INTRAVENOUS | Status: DC
  Administered 2021-10-23 – 2021-10-25 (×6): 100 mg via INTRAVENOUS
  Filled 2021-10-23 (×7): qty 100

## 2021-10-23 MED ORDER — DICYCLOMINE HCL 10 MG PO CAPS
10.0000 mg | ORAL_CAPSULE | Freq: Once | ORAL | Status: AC
  Administered 2021-10-23: 10 mg via ORAL
  Filled 2021-10-23: qty 1

## 2021-10-23 MED ORDER — ACETAMINOPHEN 650 MG RE SUPP: 650.0000 mg | Freq: Four times a day (QID) | RECTAL | Status: DC | PRN

## 2021-10-23 MED ORDER — METHOCARBAMOL 500 MG PO TABS: 500.0000 mg | ORAL_TABLET | Freq: Four times a day (QID) | ORAL | Status: DC | PRN

## 2021-10-23 MED ORDER — SODIUM CHLORIDE 0.9 % IV SOLN
1.0000 g | INTRAVENOUS | Status: DC
  Administered 2021-10-23 – 2021-10-25 (×3): 1 g via INTRAVENOUS
  Filled 2021-10-23 (×3): qty 10

## 2021-10-23 MED ORDER — PROCHLORPERAZINE EDISYLATE 10 MG/2ML IJ SOLN
10.0000 mg | Freq: Four times a day (QID) | INTRAMUSCULAR | Status: DC | PRN
Start: 2021-10-23 — End: 2021-10-27

## 2021-10-23 NOTE — ED Notes (Signed)
Patient is resting comfortably. Slight complaint of abdominal pain upon coughing and sneezing. Provider notified and pain medications ordered.

## 2021-10-23 NOTE — ED Notes (Signed)
Attempted to perform a medicine reconciliation. Patient on to many medications to know and only knows a few but is unsure if she still takes them. She is going to ask her sister to bring all of her medications to the ED so we can reconcile same.

## 2021-10-23 NOTE — ED Notes (Signed)
Patient given something to drink.

## 2021-10-23 NOTE — H&P (Addendum)
History and Physical    Yeily Link FAO:[id number] DOB: 11/14/1955 DOA: 10/22/2021  I have briefly reviewed the patient's prior medical records in [facility]  PCP: Pcp, No  Patient coming from: home with mother  Chief Complaint: abdominal pain  HPI: Shawna Smith is a 66 y.o. female with medical history significant of MS (Relapsing remitting subtype), insomnia, tobacco abuse, and h/o CVA, along with urinary retention- under the care of urology with a chronic foley catheter.  She presents to the ER with complaints of her catheter not draining properly and causing abdominal pain.  Pain improved but not resolved after catheter placed.    In the ER, CT abdomen pelvis completed which shows a short segment of dilated proximal duodenum which may be transient in nature, cholelithiasis, diffuse urinary bladder wall thickening, stable dystrophic calcifications within the head of the pancreas which may represent a pancreatitis, pancreatic ductal dilation which represents a new finding when compared to prior, abrupt change in caliber of the proximal transverse colon without associated mass--per radiology will some of these finding to be transient in nature, short-term follow-up abdomen CT pelvis is recommended. She was found to have a leukocytosis, lactic acidosis, signs of UTI, and signs of possible pneumonia on CT so hospitalists were asked to admit   Review of Systems: As per HPI otherwise 10 point review of systems negative.   Past Medical History:  Diagnosis Date   Hypertension    MS (multiple sclerosis) (HCC)    Osteoporosis     Past Surgical History:  Procedure Laterality Date   OPEN REDUCTION INTERNAL FIXATION (ORIF) METACARPAL Left 06/18/2018   Procedure: OPEN REDUCTION INTERNAL FIXATION (ORIF) LEFT HAND, MUTIPLE LEFT METACARPAL FRACTURES, AND OPEN REDUCTION OF MCP JOINT OF LEFT INDEX FINGER;  Surgeon: Dominica Severin, MD;  Location: [facility] OR;  Service: Orthopedics;  Laterality: Left;      reports that she has been smoking cigarettes. She has been smoking an average of .5 packs per day. She has never used smokeless tobacco. She reports current alcohol use. She reports that she does not use drugs.  Allergies  Allergen Reactions   Ace Inhibitors Swelling    Other reaction(s): Other (see comments)   Penicillins    Sulfa Antibiotics     No pertinent family hx  Prior to Admission medications   Medication Sig Start Date End Date Taking? Authorizing Provider  albuterol (VENTOLIN HFA) 108 (90 Base) MCG/ACT inhaler Inhale 2 puffs into the lungs every 4 (four) hours as needed for wheezing. 04/25/15   [provider]  amitriptyline (ELAVIL) 50 MG tablet Take 1 tablet by mouth at bedtime. 03/13/19   [provider]  amLODipine (NORVASC) 10 MG tablet Take 10 mg by mouth daily. 05/02/20   [provider]  ascorbic Acid (VITAMIN C) 500 MG CPCR Take 500 mg by mouth daily.    [provider]  aspirin 81 MG EC tablet Take 81 mg by mouth daily. 04/25/15   [provider]  AUBAGIO 14 MG TABS Take 14 mg by mouth daily. 05/17/20   [provider]  Calcium Carb-Ergocalciferol 500-200 MG-UNIT TABS Take 1 tablet by mouth daily.    [provider]  cholecalciferol (VITAMIN D3) 25 MCG (1000 UNIT) tablet Take 1,000 Units by mouth daily.    [provider]  Ensure Plus (ENSURE PLUS) LIQD Take 237 mLs by mouth 3 (three) times daily between meals.    [provider]  EPINEPHrine 0.3 mg/0.3 mL IJ SOAJ injection  Inject 0.3 mg into the muscle once as needed for anaphylaxis.    [provider]  gabapentin (NEURONTIN) 300 MG capsule Take 900 mg by mouth 2 (two) times daily.    [provider]  losartan-hydrochlorothiazide (HYZAAR) 50-12.5 MG tablet Take 1 tablet by mouth daily. 03/05/20   [provider]  methocarbamol (ROBAXIN) 500 MG tablet Take 1 tablet (500 mg total) by mouth every 6 (six) hours as needed for  muscle spasms. 06/19/18   Roseanne Kaufman, MD  metoprolol succinate (TOPROL-XL) 100 MG 24 hr tablet Take by mouth. 03/05/20   [provider]  Omega-3 Fatty Acids (FISH OIL) 1000 MG CAPS Take 1,000 mg by mouth daily.    [provider]  OXcarbazepine (TRILEPTAL) 150 MG tablet Take 150 mg by mouth 2 (two) times daily. 04/08/20   [provider]  oxyCODONE 10 MG TABS Take 0.5 tablets (5 mg total) by mouth every 4 (four) hours as needed for severe pain (pain score 7-10). 06/19/18   Roseanne Kaufman, MD  predniSONE (DELTASONE) 10 MG tablet Take 4 tablets (40 mg total) by mouth daily. 09/25/20   Fredia Sorrow, MD  traMADol (ULTRAM) 50 MG tablet Take 50 mg by mouth 4 (four) times daily as needed. 05/02/20   [provider]  traZODone (DESYREL) 50 MG tablet Take 50-100 mg by mouth at bedtime as needed. 04/17/20   [provider]    Physical Exam: Vitals:   10/23/21 1232 10/23/21 1400 10/23/21 1441 10/23/21 1614  BP:  123/83 123/83 (!) 133/90  Pulse:  (!) 106 (!) 106 (!) 107  Resp:  20 19 16   Temp:   98.5 F (36.9 C) 98.4 F (36.9 C)  TempSrc:   Oral Oral  SpO2: (!) 89% 91% 94% 99%  Weight:      Height:          Constitutional: NAD, calm, comfortable Eyes: PERRL, lids and conjunctivae normal ENMT: Mucous membranes are moist. Posterior pharynx clear of any exudate or lesions.Normal dentition.  Respiratory: clear to auscultation bilaterally, no wheezing, no crackles. Normal respiratory effort. No accessory muscle use.  Cardiovascular: Regular rate and rhythm, no murmurs / rubs / gallops. No extremity edema. 2+ pedal pulses.  Abdomen:minimal tenderness, no masses palpated. Bowel sounds positive.  Musculoskeletal: no clubbing / cyanosis. Weakened muscle tone Skin: no rashes, lesions, ulcers. No induration Psychiatric:  Alert and oriented x 2. Normal mood.   Labs on Admission: I have personally reviewed following labs and imaging studies  CBC: Recent  Labs  Lab 10/22/21 1610  WBC 11.3*  NEUTROABS 10.4*  HGB 16.0*  HCT 43.2  MCV 96.6  PLT [id number]   Basic Metabolic Panel: Recent Labs  Lab 10/22/21 1610  NA 131*  K 4.2  CL 92*  CO2 25  GLUCOSE 157*  BUN 24*  CREATININE 1.51*  CALCIUM 10.6*   GFR: Estimated Creatinine Clearance: 30 mL/min (A) (by C-G formula based on SCr of 1.51 mg/dL (H)). Liver Function Tests: Recent Labs  Lab 10/22/21 1610  AST 25  ALT 14  ALKPHOS 80  BILITOT 0.9  PROT 8.1  ALBUMIN 4.3   Recent Labs  Lab 10/22/21 1610  LIPASE 74*   No results for input(s): "AMMONIA" in the last 168 hours. Coagulation Profile: No results for input(s): "INR", "PROTIME" in the last 168 hours. Cardiac Enzymes: No results for input(s): "CKTOTAL", "CKMB", "CKMBINDEX", "TROPONINI" in the last 168 hours. BNP (last 3 results) No results for input(s): "PROBNP" in the last  8760 hours. HbA1C: No results for input(s): "HGBA1C" in the last 72 hours. CBG: No results for input(s): "GLUCAP" in the last 168 hours. Lipid Profile: No results for input(s): "CHOL", "HDL", "LDLCALC", "TRIG", "CHOLHDL", "LDLDIRECT" in the last 72 hours. Thyroid Function Tests: No results for input(s): "TSH", "T4TOTAL", "FREET4", "T3FREE", "THYROIDAB" in the last 72 hours. Anemia Panel: No results for input(s): "VITAMINB12", "FOLATE", "FERRITIN", "TIBC", "IRON", "RETICCTPCT" in the last 72 hours. Urine analysis:    Component Value Date/Time   COLORURINE YELLOW 10/22/2021 1612   APPEARANCEUR CLOUDY (A) 10/22/2021 1612   LABSPEC 1.020 10/22/2021 1612   PHURINE 8.5 (H) 10/22/2021 1612   GLUCOSEU NEGATIVE 10/22/2021 1612   HGBUR MODERATE (A) 10/22/2021 1612   BILIRUBINUR NEGATIVE 10/22/2021 1612   KETONESUR NEGATIVE 10/22/2021 1612   PROTEINUR >=300 (A) 10/22/2021 1612   NITRITE NEGATIVE 10/22/2021 1612   LEUKOCYTESUR LARGE (A) 10/22/2021 1612     Radiological Exams on Admission: CT ABDOMEN PELVIS W CONTRAST  Result Date:  10/22/2021 CLINICAL DATA:  Abdominal pain. EXAM: CT ABDOMEN AND PELVIS WITH CONTRAST TECHNIQUE: Multidetector CT imaging of the abdomen and pelvis was performed using the standard protocol following bolus administration of intravenous contrast. RADIATION DOSE REDUCTION: This exam was performed according to the departmental dose-optimization program which includes automated exposure control, adjustment of the mA and/or kV according to patient size and/or use of iterative reconstruction technique. CONTRAST:  121mL OMNIPAQUE IOHEXOL 350 MG/ML SOLN COMPARISON:  June 21, 2021 FINDINGS: Lower chest: Mild atelectasis is seen within the bilateral lung bases. Hepatobiliary: No focal liver abnormality is seen. Subcentimeter gallstones are seen within the lumen of an otherwise normal-appearing gallbladder. There is no evidence of biliary dilatation. Pancreas: The main pancreatic duct is prominent and measures 5.8 mm in diameter (measured approximately 2.5 mm on the prior study). Numerous stable dystrophic calcifications are seen within the head of the pancreas. Diffuse atrophic changes are seen throughout the body and tail of the pancreas. Spleen: The spleen is small in size and otherwise unremarkable. Adrenals/Urinary Tract: Adrenal glands are unremarkable. Kidneys are normal in size, without renal calculi or hydronephrosis. A 1.5 cm simple cyst is seen within the lateral aspect of the mid left kidney. An additional 4 mm focus of parenchymal low attenuation is seen within the mid right kidney. This is too small to characterize by CT examination. A Foley catheter is seen within an empty urinary bladder. Diffuse urinary bladder wall thickening is noted. Stomach/Bowel: The stomach is moderately distended. There is dilatation of a short segment of the proximal duodenum, involving predominately the proximal portion horizontal part. A transition zone is seen as the duodenum crosses the midline (axial CT images 38 through 44, CT  series 2). The appendix is not clearly identified. A 13 mm x 6 mm focus of air, possibly representing a diverticulum, is seen within the anterior wall of the mid transverse colon (axial CT image 64, CT series 2). This portion of large bowel is redundant in appearance. An abrupt change in large bowel caliber is also seen within the proximal transverse colon (axial CT images 56 through 60, CT series 2/coronal reformatted images 50 through 54, CT series 5). No mass lesions are identified within this region. Stool is seen throughout the large bowel with noninflamed diverticula seen within the sigmoid colon. Vascular/Lymphatic: Aortic atherosclerosis. No enlarged abdominal or pelvic lymph nodes. Reproductive: A 1.4 cm diameter heterogeneous uterine fibroid is noted within the uterine fundus. The bilateral adnexa are unremarkable. Other: No abdominal wall hernia  or abnormality. No abdominopelvic ascites. Musculoskeletal: A stable sclerotic lesion is seen within the right pedicle of the L1 vertebral body. Multilevel degenerative changes are seen throughout the lumbar spine. IMPRESSION: 1. Short segment of dilated proximal duodenum which may be transient in nature. Correlation with follow-up abdomen pelvis CT is recommended to determine any changes in bowel caliber, if a proximal small bowel obstruction is of clinical concern. 2. Cholelithiasis. 3. Sigmoid diverticulosis. 4. Diffuse urinary bladder wall thickening which may be, in part, due to poor bladder distention. Sequelae associated with acute cystitis cannot completely be excluded. Correlation with urinalysis is recommended. 5. Stable dystrophic calcifications within the head of the pancreas which may represent a pancreatitis. 6. Pancreatic ductal dilatation which represents a new finding when compared to the prior study. Follow-up MRCP is recommended. 7. Abrupt change in caliber of the proximal transverse colon without associated mass. While this may be transient in  nature, short-term follow-up abdomen and pelvis CT is recommended. 8. Aortic atherosclerosis. Aortic Atherosclerosis (ICD10-I70.0). Electronically Signed   By: Aram Candela M.D.   On: 10/22/2021 18:20   CT Angio Chest PE W and/or Wo Contrast  Result Date: 10/22/2021 CLINICAL DATA:  Tachycardia, hypoxia, high clinical suspicion for PE EXAM: CT ANGIOGRAPHY CHEST WITH CONTRAST TECHNIQUE: Multidetector CT imaging of the chest was performed using the standard protocol during bolus administration of intravenous contrast. Multiplanar CT image reconstructions and MIPs were obtained to evaluate the vascular anatomy. RADIATION DOSE REDUCTION: This exam was performed according to the departmental dose-optimization program which includes automated exposure control, adjustment of the mA and/or kV according to patient size and/or use of iterative reconstruction technique. CONTRAST:  OMNIPAQUE IOHEXOL 350 MG/ML SOLN COMPARISON:  Chest radiograph done on 06/21/2021, CTA done on 05/28/2020 FINDINGS: Cardiovascular: There is homogeneous enhancement in thoracic aorta. There is no demonstrable intimal flap. Scattered atherosclerotic plaques and calcifications are seen in thoracic aorta and its major branches. Coronary artery calcifications are seen. There are no intraluminal filling defects in central pulmonary artery branches. Evaluation of small peripheral branches is limited by infiltrates in the lower lung fields. Mediastinum/Nodes: No significant lymphadenopathy is seen. Lungs/Pleura: There are linear patchy densities in both lower lung fields, more so on the right side. There is no focal consolidation. There is interval clearing of bilateral pleural effusions. There is no pneumothorax. Musculoskeletal: Mixed density lesion seen in L1 vertebra has not changed significantly. Review of the MIP images confirms the above findings. IMPRESSION: There is no evidence of pulmonary artery embolism. There is no evidence of  thoracic aortic dissection. Linear patchy densities in both lower lung fields suggest atelectasis/pneumonia. There is no focal consolidation. There is no pleural effusion. Other findings as described in the body of the report. Electronically Signed   By: Ernie Avena M.D.   On: 10/22/2021 17:31    EKG: Independently reviewed. sinus  Assessment/Plan Principal Problem:   Complicated UTI (urinary tract infection) Active Problems:   Multiple sclerosis (HCC)   Tobacco use   Abdominal pain due to malfunctioning foley catheter -foley now working  -improvement in pain -PO pain meds if needed  UTI -culture pending -on abx -prior culture Kleb Pna -follows with urology in [place]  CKD stage IIIa -monitor Cr  Abnormal CT scan Short segment of dilated proximal duodenum which may be transient in nature. Correlation with follow-up abdomen pelvis CT is recommended to determine any changes in bowel caliber, if a proximal small bowel obstruction is of clinical concern. Stable dystrophic calcifications within  the head of the pancreas which may represent a pancreatitis.  Pancreatic ductal dilatation which represents a new finding when compared to the prior study. Follow-up MRCP is recommended. Abrupt change in caliber of the proximal transverse colon without associated mass. While this may be transient in nature, short-term follow-up abdomen and pelvis CT is recommended. MS  Hyponatremia -mild, recheck labs in AM  Atelectasis with ? Of PNA -incentive spirometry  Tobacco abuse -encourage cessation  HTN -resume low dose of BB  Elevated calcium -IVF and recheck in AM -normal albumin  Home meds to be reconciled  DVT prophylaxis: lovenox  Code Status: full  Disposition Plan: home once medically stable Consults called: none    Admission status: obs   At the point of initial evaluation, it is my clinical opinion that admission for OBSERVATION is reasonable and necessary  because the patient's presenting complaints in the context of their chronic conditions represent sufficient risk of deterioration or significant morbidity to constitute reasonable grounds for close observation in the hospital setting, but that the patient may be medically stable for discharge from the hospital within 24 to 48 hours.     Geradine Girt [facility]   How to contact the [facility] Attending or Consulting provider [place] or covering provider during after hours [place], for this patient?  Check the care team in [facility] and look for a) attending/consulting [facility] provider listed and b) the [facility] team listed Log into [email or web address] and use [place]'s universal password to access. If you do not have the password, please contact the hospital operator. Locate the [facility] provider you are looking for under [facility] and page to a number that you can be directly reached. If you still have difficulty reaching the provider, please page the [facility] (Director on Call) for the Hospitalists listed on amion for assistance.  10/23/2021, 4:52 PM

## 2021-10-24 DIAGNOSIS — N1831 Chronic kidney disease, stage 3a: Secondary | ICD-10-CM | POA: Diagnosis present

## 2021-10-24 DIAGNOSIS — T83018A Breakdown (mechanical) of other indwelling urethral catheter, initial encounter: Secondary | ICD-10-CM | POA: Diagnosis present

## 2021-10-24 DIAGNOSIS — N390 Urinary tract infection, site not specified: Secondary | ICD-10-CM | POA: Diagnosis present

## 2021-10-24 DIAGNOSIS — J9601 Acute respiratory failure with hypoxia: Secondary | ICD-10-CM | POA: Diagnosis present

## 2021-10-24 DIAGNOSIS — F17210 Nicotine dependence, cigarettes, uncomplicated: Secondary | ICD-10-CM | POA: Diagnosis present

## 2021-10-24 DIAGNOSIS — Z8673 Personal history of transient ischemic attack (TIA), and cerebral infarction without residual deficits: Secondary | ICD-10-CM | POA: Diagnosis not present

## 2021-10-24 DIAGNOSIS — I129 Hypertensive chronic kidney disease with stage 1 through stage 4 chronic kidney disease, or unspecified chronic kidney disease: Secondary | ICD-10-CM | POA: Diagnosis present

## 2021-10-24 DIAGNOSIS — G35 Multiple sclerosis: Secondary | ICD-10-CM | POA: Diagnosis present

## 2021-10-24 DIAGNOSIS — E876 Hypokalemia: Secondary | ICD-10-CM | POA: Diagnosis not present

## 2021-10-24 DIAGNOSIS — Z20822 Contact with and (suspected) exposure to covid-19: Secondary | ICD-10-CM | POA: Diagnosis present

## 2021-10-24 DIAGNOSIS — Z888 Allergy status to other drugs, medicaments and biological substances status: Secondary | ICD-10-CM | POA: Diagnosis not present

## 2021-10-24 DIAGNOSIS — T83511A Infection and inflammatory reaction due to indwelling urethral catheter, initial encounter: Secondary | ICD-10-CM | POA: Diagnosis present

## 2021-10-24 DIAGNOSIS — Z880 Allergy status to penicillin: Secondary | ICD-10-CM | POA: Diagnosis not present

## 2021-10-24 DIAGNOSIS — Y846 Urinary catheterization as the cause of abnormal reaction of the patient, or of later complication, without mention of misadventure at the time of the procedure: Secondary | ICD-10-CM | POA: Diagnosis present

## 2021-10-24 DIAGNOSIS — J9811 Atelectasis: Secondary | ICD-10-CM | POA: Diagnosis present

## 2021-10-24 DIAGNOSIS — E872 Acidosis, unspecified: Secondary | ICD-10-CM | POA: Diagnosis present

## 2021-10-24 DIAGNOSIS — Y732 Prosthetic and other implants, materials and accessory gastroenterology and urology devices associated with adverse incidents: Secondary | ICD-10-CM | POA: Diagnosis present

## 2021-10-24 DIAGNOSIS — Z882 Allergy status to sulfonamides status: Secondary | ICD-10-CM | POA: Diagnosis not present

## 2021-10-24 DIAGNOSIS — J189 Pneumonia, unspecified organism: Secondary | ICD-10-CM | POA: Diagnosis present

## 2021-10-24 DIAGNOSIS — Z79899 Other long term (current) drug therapy: Secondary | ICD-10-CM | POA: Diagnosis not present

## 2021-10-24 DIAGNOSIS — Z7982 Long term (current) use of aspirin: Secondary | ICD-10-CM | POA: Diagnosis not present

## 2021-10-24 DIAGNOSIS — G4700 Insomnia, unspecified: Secondary | ICD-10-CM | POA: Diagnosis present

## 2021-10-24 DIAGNOSIS — E871 Hypo-osmolality and hyponatremia: Secondary | ICD-10-CM | POA: Diagnosis present

## 2021-10-24 DIAGNOSIS — R948 Abnormal results of function studies of other organs and systems: Secondary | ICD-10-CM | POA: Diagnosis present

## 2021-10-24 DIAGNOSIS — Z7952 Long term (current) use of systemic steroids: Secondary | ICD-10-CM | POA: Diagnosis not present

## 2021-10-24 LAB — CBC
HCT: 32.6 % — ABNORMAL LOW (ref 36.0–46.0)
Hemoglobin: 11.7 g/dL — ABNORMAL LOW (ref 12.0–15.0)
MCH: 36.1 pg — ABNORMAL HIGH (ref 26.0–34.0)
MCHC: 35.9 g/dL (ref 30.0–36.0)
MCV: 100.6 fL — ABNORMAL HIGH (ref 80.0–100.0)
Platelets: 208 10*3/uL (ref 150–400)
RBC: 3.24 MIL/uL — ABNORMAL LOW (ref 3.87–5.11)
RDW: 16.4 % — ABNORMAL HIGH (ref 11.5–15.5)
WBC: 10.5 10*3/uL (ref 4.0–10.5)
nRBC: 0 % (ref 0.0–0.2)

## 2021-10-24 LAB — BASIC METABOLIC PANEL
Anion gap: 8 (ref 5–15)
BUN: 12 mg/dL (ref 8–23)
CO2: 22 mmol/L (ref 22–32)
Calcium: 9.2 mg/dL (ref 8.9–10.3)
Chloride: 107 mmol/L (ref 98–111)
Creatinine, Ser: 0.8 mg/dL (ref 0.44–1.00)
GFR, Estimated: >60 mL/min (ref >60)
Glucose, Bld: 83 mg/dL (ref 70–99)
Potassium: 3.4 mmol/L — ABNORMAL LOW (ref 3.5–5.1)
Sodium: 137 mmol/L (ref 135–145)

## 2021-10-24 LAB — HIV ANTIBODY (ROUTINE TESTING W REFLEX): HIV Screen 4th Generation wRfx: NONREACTIVE

## 2021-10-24 MED ORDER — AMLODIPINE BESYLATE 10 MG PO TABS: 10.0000 mg | ORAL_TABLET | Freq: Every day | ORAL | Status: DC

## 2021-10-24 MED ORDER — CHLORHEXIDINE GLUCONATE CLOTH 2 % EX PADS
6.0000 | MEDICATED_PAD | Freq: Every day | CUTANEOUS | Status: DC
  Administered 2021-10-24 – 2021-10-27 (×4): 6 via TOPICAL

## 2021-10-24 MED ORDER — ENOXAPARIN SODIUM 40 MG/0.4ML IJ SOSY
40.0000 mg | PREFILLED_SYRINGE | INTRAMUSCULAR | Status: DC
  Administered 2021-10-24 – 2021-10-26 (×3): 40 mg via SUBCUTANEOUS
  Filled 2021-10-24 (×3): qty 0.4

## 2021-10-24 MED ORDER — ALBUTEROL SULFATE (2.5 MG/3ML) 0.083% IN NEBU: 3.0000 mL | INHALATION_SOLUTION | Freq: Three times a day (TID) | RESPIRATORY_TRACT | Status: DC | PRN

## 2021-10-24 MED ORDER — OXCARBAZEPINE 150 MG PO TABS
150.0000 mg | ORAL_TABLET | Freq: Two times a day (BID) | ORAL | Status: DC
  Administered 2021-10-24 – 2021-10-27 (×6): 150 mg via ORAL
  Filled 2021-10-24 (×8): qty 1

## 2021-10-24 MED ORDER — AMLODIPINE BESYLATE 10 MG PO TABS
10.0000 mg | ORAL_TABLET | Freq: Every day | ORAL | Status: DC
  Administered 2021-10-24 – 2021-10-27 (×4): 10 mg via ORAL
  Filled 2021-10-24 (×4): qty 1

## 2021-10-24 MED ORDER — POTASSIUM CHLORIDE CRYS ER 20 MEQ PO TBCR
40.0000 meq | EXTENDED_RELEASE_TABLET | Freq: Once | ORAL | Status: AC
  Administered 2021-10-24: 40 meq via ORAL
  Filled 2021-10-24: qty 2

## 2021-10-24 NOTE — Progress Notes (Signed)
PROGRESS NOTE    Carlinda Tape  [id number] DOB: Jan 18, 1956 DOA: 10/22/2021 PCP: Merryl Hacker, No    Brief Narrative:  Shawna Smith is a 66 y.o. female with medical history significant of MS (Relapsing remitting subtype), insomnia, tobacco abuse, and h/o CVA, along with urinary retention- under the care of urology with a chronic foley catheter.  She presents to the ER with complaints of her catheter not draining properly and causing abdominal pain.  Pain improved but not resolved after catheter placed.     In the ER, CT abdomen pelvis completed which shows a short segment of dilated proximal duodenum which may be transient in nature, cholelithiasis, diffuse urinary bladder wall thickening, stable dystrophic calcifications within the head of the pancreas which may represent a pancreatitis, pancreatic ductal dilation which represents a new finding when compared to prior, abrupt change in caliber of the proximal transverse colon without associated mass--per radiology will some of these finding to be transient in nature, short-term follow-up abdomen CT pelvis is recommended. She was found to have a leukocytosis, lactic acidosis, signs of UTI, and signs of possible pneumonia on CT so hospitalists were asked to admit   Assessment and Plan: Abdominal pain due to malfunctioning foley catheter -foley now working  -improvement in pain -PO pain meds if needed -has urology in [place]   UTI -culture pending- gram negative rods -on abx -prior culture Kleb Pna -follows with urology in [place]   CKD stage IIIa -CR improved   Abnormal CT scan Short segment of dilated proximal duodenum which may be transient in nature. Correlation with follow-up abdomen pelvis CT is recommended to determine any changes in bowel caliber, if a proximal small bowel obstruction is of clinical concern. Stable dystrophic calcifications within the head of the pancreas which may represent a pancreatitis.  Pancreatic ductal dilatation which  represents a new finding when compared to the prior study. Follow-up MRCP is recommended. Abrupt change in caliber of the proximal transverse colon without associated mass. While this may be transient in nature, short-term follow-up abdomen and pelvis CT is recommended. MS -will need outpatient follow up -eating well, no abdominal pain   Hyponatremia -resolved  Hypokalemia -replete   Atelectasis with ? Of PNA -incentive spirometry   Tobacco abuse -encourage cessation   HTN -resume home meds as able   Elevated calcium -resolved with IVF   MS -follows wit neuro -resume home meds when able -PT/OT    DVT prophylaxis: enoxaparin (LOVENOX) injection 40 mg Start: 10/24/21 2200    Code Status: Full Code Family Communication:   Disposition Plan:  Level of care: Telemetry Status is: Observation The patient will require care spanning > 2 midnights and should be moved to inpatient because: await culture    Consultants:  none   Subjective: Feeling better, eating well  Objective: Vitals:   10/23/21 1952 10/24/21 0003 10/24/21 0612 10/24/21 0917  BP: 135/82 (!) 147/85 (!) 154/89 (!) 147/77  Pulse: (!) 104 96 91   Resp: 18 18    Temp: 98.6 F (37 C) 98.4 F (36.9 C) 98.1 F (36.7 C)   TempSrc: Oral Oral Oral   SpO2: 92% 91% 96%   Weight:      Height:        Intake/Output Summary (Last 24 hours) at 10/24/2021 1020 Last data filed at 10/24/2021 1014 Gross per 24 hour  Intake 829.55 ml  Output 800 ml  Net 29.55 ml   Filed Weights   10/22/21 1441 10/23/21 1653  Weight: 49.9  kg 49.9 kg    Examination:   General: Appearance:    Thin female in no acute distress     Lungs:      respirations unlabored  Heart:    Normal heart rate.  MS:   All extremities are intact.    Neurologic:   Awake, alert       Data Reviewed: I have personally reviewed following labs and imaging studies  CBC: Recent Labs  Lab 10/22/21 1610 10/24/21 0434  WBC 11.3* 10.5   NEUTROABS 10.4*  --   HGB 16.0* 11.7*  HCT 43.2 32.6*  MCV 96.6 100.6*  PLT 282 208   Basic Metabolic Panel: Recent Labs  Lab 10/22/21 1610 10/24/21 0434  NA 131* 137  K 4.2 3.4*  CL 92* 107  CO2 25 22  GLUCOSE 157* 83  BUN 24* 12  CREATININE 1.51* 0.80  CALCIUM 10.6* 9.2   GFR: Estimated Creatinine Clearance: 56.7 mL/min (by C-G formula based on SCr of 0.8 mg/dL). Liver Function Tests: Recent Labs  Lab 10/22/21 1610  AST 25  ALT 14  ALKPHOS 80  BILITOT 0.9  PROT 8.1  ALBUMIN 4.3   Recent Labs  Lab 10/22/21 1610  LIPASE 74*   No results for input(s): "AMMONIA" in the last 168 hours. Coagulation Profile: No results for input(s): "INR", "PROTIME" in the last 168 hours. Cardiac Enzymes: No results for input(s): "CKTOTAL", "CKMB", "CKMBINDEX", "TROPONINI" in the last 168 hours. BNP (last 3 results) No results for input(s): "PROBNP" in the last 8760 hours. HbA1C: No results for input(s): "HGBA1C" in the last 72 hours. CBG: No results for input(s): "GLUCAP" in the last 168 hours. Lipid Profile: No results for input(s): "CHOL", "HDL", "LDLCALC", "TRIG", "CHOLHDL", "LDLDIRECT" in the last 72 hours. Thyroid Function Tests: No results for input(s): "TSH", "T4TOTAL", "FREET4", "T3FREE", "THYROIDAB" in the last 72 hours. Anemia Panel: No results for input(s): "VITAMINB12", "FOLATE", "FERRITIN", "TIBC", "IRON", "RETICCTPCT" in the last 72 hours. Sepsis Labs: Recent Labs  Lab 10/22/21 1755 10/23/21 0056  LATICACIDVEN 3.1* 1.7    Recent Results (from the past 240 hour(s))  SARS Coronavirus 2 by RT PCR (hospital order, performed in [facility] hospital lab) *cepheid single result test* Anterior Nasal Swab     Status: None   Collection Time: 10/22/21  4:02 PM   Specimen: Anterior Nasal Swab  Result Value Ref Range Status   SARS Coronavirus 2 by RT PCR NEGATIVE NEGATIVE Final    Comment: (NOTE) SARS-CoV-2 target nucleic acids are NOT DETECTED.  The SARS-CoV-2  RNA is generally detectable in upper and lower respiratory specimens during the acute phase of infection. The lowest concentration of SARS-CoV-2 viral copies this assay can detect is 250 copies / mL. A negative result does not preclude SARS-CoV-2 infection and should not be used as the sole basis for treatment or other patient management decisions.  A negative result may occur with improper specimen collection / handling, submission of specimen other than nasopharyngeal swab, presence of viral mutation(s) within the areas targeted by this assay, and inadequate number of viral copies (<250 copies / mL). A negative result must be combined with clinical observations, patient history, and epidemiological information.  Fact Sheet for Patients:   RoadLapTop.co.za  Fact Sheet for Healthcare Providers: http://kim-miller.com/  This test is not yet approved or  cleared by the [place] FDA and has been authorized for detection and/or diagnosis of SARS-CoV-2 by FDA under an Emergency Use Authorization (EUA).  This EUA will remain in  effect (meaning this test can be used) for the duration of the COVID-19 declaration under Section 564(b)(1) of the Act, 21 U.S.C. section 360bbb-3(b)(1), unless the authorization is terminated or revoked sooner.  Performed at [facility], [address] [place], [place] [address]   Urine Culture     Status: Abnormal (Preliminary result)   Collection Time: 10/22/21  4:12 PM   Specimen: Urine, Clean Catch  Result Value Ref Range Status   Specimen Description   Final    URINE, CLEAN CATCH Performed at [facility], [address] [place], [place] [address]    Special Requests   Final    NONE Performed at [facility], [address] [place], [place] [address]    Culture (A)  Final    >=100,000 COLONIES/mL PROVIDENCIA RETTGERI CULTURE REINCUBATED FOR BETTER GROWTH Performed  at [facility], [place] [address] [place], [place] [address]    Report Status PENDING  Incomplete  Blood culture (routine x 2)     Status: None (Preliminary result)   Collection Time: 10/22/21  5:55 PM   Specimen: Left Antecubital; Blood  Result Value Ref Range Status   Specimen Description   Final    LEFT ANTECUBITAL BLOOD Performed at [facility], [place] [address] [place], [place] [address]    Special Requests   Final    BOTTLES DRAWN AEROBIC AND ANAEROBIC Blood Culture adequate volume Performed at [facility], [address] [place], [place] [address]    Culture   Final    NO GROWTH 2 DAYS Performed at [facility], [place] [address], [place], [place] [address]    Report Status PENDING  Incomplete         Radiology Studies: CT ABDOMEN PELVIS W CONTRAST  Result Date: 10/22/2021 CLINICAL DATA:  Abdominal pain. EXAM: CT ABDOMEN AND PELVIS WITH CONTRAST TECHNIQUE: Multidetector CT imaging of the abdomen and pelvis was performed using the standard protocol following bolus administration of intravenous contrast. RADIATION DOSE REDUCTION: This exam was performed according to the departmental dose-optimization program which includes automated exposure control, adjustment of the mA and/or kV according to patient size and/or use of iterative reconstruction technique. CONTRAST:  130mL OMNIPAQUE IOHEXOL 350 MG/ML SOLN COMPARISON:  June 21, 2021 FINDINGS: Lower chest: Mild atelectasis is seen within the bilateral lung bases. Hepatobiliary: No focal liver abnormality is seen. Subcentimeter gallstones are seen within the lumen of an otherwise normal-appearing gallbladder. There is no evidence of biliary dilatation. Pancreas: The main pancreatic duct is prominent and measures 5.8 mm in diameter (measured approximately 2.5 mm on the prior study). Numerous stable dystrophic calcifications are seen within the head of the pancreas. Diffuse atrophic changes are seen throughout  the body and tail of the pancreas. Spleen: The spleen is small in size and otherwise unremarkable. Adrenals/Urinary Tract: Adrenal glands are unremarkable. Kidneys are normal in size, without renal calculi or hydronephrosis. A 1.5 cm simple cyst is seen within the lateral aspect of the mid left kidney. An additional 4 mm focus of parenchymal low attenuation is seen within the mid right kidney. This is too small to characterize by CT examination. A Foley catheter is seen within an empty urinary bladder. Diffuse urinary bladder wall thickening is noted. Stomach/Bowel: The stomach is moderately distended. There is dilatation of a short segment of the proximal duodenum, involving predominately the proximal portion horizontal part. A transition zone is seen as  the duodenum crosses the midline (axial CT images 38 through 44, CT series 2). The appendix is not clearly identified. A 13 mm x 6 mm focus of air, possibly representing a diverticulum, is seen within the anterior wall of the mid transverse colon (axial CT image 64, CT series 2). This portion of large bowel is redundant in appearance. An abrupt change in large bowel caliber is also seen within the proximal transverse colon (axial CT images 56 through 60, CT series 2/coronal reformatted images 50 through 54, CT series 5). No mass lesions are identified within this region. Stool is seen throughout the large bowel with noninflamed diverticula seen within the sigmoid colon. Vascular/Lymphatic: Aortic atherosclerosis. No enlarged abdominal or pelvic lymph nodes. Reproductive: A 1.4 cm diameter heterogeneous uterine fibroid is noted within the uterine fundus. The bilateral adnexa are unremarkable. Other: No abdominal wall hernia or abnormality. No abdominopelvic ascites. Musculoskeletal: A stable sclerotic lesion is seen within the right pedicle of the L1 vertebral body. Multilevel degenerative changes are seen throughout the lumbar spine. IMPRESSION: 1. Short segment of  dilated proximal duodenum which may be transient in nature. Correlation with follow-up abdomen pelvis CT is recommended to determine any changes in bowel caliber, if a proximal small bowel obstruction is of clinical concern. 2. Cholelithiasis. 3. Sigmoid diverticulosis. 4. Diffuse urinary bladder wall thickening which may be, in part, due to poor bladder distention. Sequelae associated with acute cystitis cannot completely be excluded. Correlation with urinalysis is recommended. 5. Stable dystrophic calcifications within the head of the pancreas which may represent a pancreatitis. 6. Pancreatic ductal dilatation which represents a new finding when compared to the prior study. Follow-up MRCP is recommended. 7. Abrupt change in caliber of the proximal transverse colon without associated mass. While this may be transient in nature, short-term follow-up abdomen and pelvis CT is recommended. 8. Aortic atherosclerosis. Aortic Atherosclerosis (ICD10-I70.0). Electronically Signed   By: Virgina Norfolk M.D.   On: 10/22/2021 18:20   CT Angio Chest PE W and/or Wo Contrast  Result Date: 10/22/2021 CLINICAL DATA:  Tachycardia, hypoxia, high clinical suspicion for PE EXAM: CT ANGIOGRAPHY CHEST WITH CONTRAST TECHNIQUE: Multidetector CT imaging of the chest was performed using the standard protocol during bolus administration of intravenous contrast. Multiplanar CT image reconstructions and MIPs were obtained to evaluate the vascular anatomy. RADIATION DOSE REDUCTION: This exam was performed according to the departmental dose-optimization program which includes automated exposure control, adjustment of the mA and/or kV according to patient size and/or use of iterative reconstruction technique. CONTRAST:  170mL OMNIPAQUE IOHEXOL 350 MG/ML SOLN COMPARISON:  Chest radiograph done on 06/21/2021, CTA done on 05/28/2020 FINDINGS: Cardiovascular: There is homogeneous enhancement in thoracic aorta. There is no demonstrable intimal  flap. Scattered atherosclerotic plaques and calcifications are seen in thoracic aorta and its major branches. Coronary artery calcifications are seen. There are no intraluminal filling defects in central pulmonary artery branches. Evaluation of small peripheral branches is limited by infiltrates in the lower lung fields. Mediastinum/Nodes: No significant lymphadenopathy is seen. Lungs/Pleura: There are linear patchy densities in both lower lung fields, more so on the right side. There is no focal consolidation. There is interval clearing of bilateral pleural effusions. There is no pneumothorax. Musculoskeletal: Mixed density lesion seen in L1 vertebra has not changed significantly. Review of the MIP images confirms the above findings. IMPRESSION: There is no evidence of pulmonary artery embolism. There is no evidence of thoracic aortic dissection. Linear patchy densities in both lower lung fields suggest atelectasis/pneumonia. There  is no focal consolidation. There is no pleural effusion. Other findings as described in the body of the report. Electronically Signed   By: Ernie Avena M.D.   On: 10/22/2021 17:31        Scheduled Meds:  amLODipine  10 mg Oral Daily   aspirin EC  81 mg Oral Daily   Chlorhexidine Gluconate Cloth  6 each Topical Daily   enoxaparin (LOVENOX) injection  40 mg Subcutaneous Q24H   metoprolol succinate  50 mg Oral QHS   Continuous Infusions:  cefTRIAXone (ROCEPHIN)  IV 1 g (10/23/21 1809)   doxycycline (VIBRAMYCIN) IV 100 mg (10/24/21 0929)   lactated ringers 50 mL/hr at 10/23/21 1901     LOS: 0 days    Time spent: 45 minutes spent on chart review, discussion with nursing staff, consultants, updating family and interview/physical exam; more than 50% of that time was spent in counseling and/or coordination of care.    Joseph Art, DO [facility] Available via Epic secure chat 7am-7pm After these hours, please refer to coverage provider listed on  [email or web address] 10/24/2021, 10:20 AM

## 2021-10-24 NOTE — Evaluation (Addendum)
Physical Therapy Evaluation Patient Details Name: Shawna Smith MRN: [id number] DOB: [birth year] Today's Date: 10/24/2021  History of Present Illness  Shawna Smith is a 66 y.o. female with medical history significant of MS (Relapsing remitting subtype), insomnia, tobacco abuse, and h/o CVA, along with urinary retention- under the care of urology with a chronic foley catheter.  She presents to the ER with complaints of her catheter not draining properly and causing abdominal pain,She was found to have a leukocytosis, lactic acidosis, signs of UTI, and signs of possible pneumonia on CT  Clinical Impression   Patient admitted with above problems.  Patient lives with sister and mother. Sister assists with ADL's , ambulates with a cane or RW. Patient reports HHPT began last week.  SPO2 95% , HR 90 at rest/RA Patient is noted with 3/4 dyspnea after ambulating  x 45'. HR114, SPO2 picking up at 70% with return to 94% after ~ 2 minutes. Patient does have possible PNeumonia but not on O2 PTA.   Pt admitted with above diagnosis.  Pt currently with functional limitations due to the deficits listed below (see PT Problem List). Pt will benefit from skilled PT to increase their independence and safety with mobility to allow discharge to the venue listed below.         Recommendations for follow up therapy are one component of a multi-disciplinary discharge planning process, led by the attending physician.  Recommendations may be updated based on patient status, additional functional criteria and insurance authorization.  Follow Up Recommendations Home health PT (pt states PT started last week)      Assistance Recommended at Discharge    Patient can return home with the following  A little help with walking and/or transfers;A little help with bathing/dressing/bathroom;Assistance with cooking/housework;Assist for transportation;Help with stairs or ramp for entrance    Equipment Recommendations None recommended by  PT  Recommendations for Other Services       Functional Status Assessment Patient has had a recent decline in their functional status and demonstrates the ability to make significant improvements in function in a reasonable and predictable amount of time.     Precautions / Restrictions Precautions Precautions: Fall Precaution Comments: monitor ats      Mobility  Bed Mobility Overal bed mobility: Modified Independent                  Transfers Overall transfer level: Needs assistance Equipment used: Rolling walker (2 wheels) Transfers: Sit to/from Stand, Bed to chair/wheelchair/BSC Sit to Stand: Min guard   Step pivot transfers: Min guard       General transfer comment: stood from bed and recliner with min guard. Patient stepped from recliner to bed holding with 1 UE  to step    Ambulation/Gait Ambulation/Gait assistance: Min assist Gait Distance (Feet): 45 Feet Assistive device: Rolling walker (2 wheels) Gait Pattern/deviations: Step-through pattern Gait velocity: decr     General Gait Details: slow gait speed  Stairs            Wheelchair Mobility    Modified Rankin (Stroke Patients Only)       Balance Overall balance assessment: Mild deficits observed, not formally tested                                           Pertinent Vitals/Pain Pain Assessment Pain Assessment: No/denies pain    Home Living Family/patient  expects to be discharged to:: Private residence Living Arrangements: Parent;Other relatives Available Help at Discharge: Family;Available PRN/intermittently Type of Home: House Home Access: Stairs to enter   Entrance Stairs-Number of Steps: 1   Home Layout: One level Home Equipment: Agricultural consultant (2 wheels);BSC/3in1;Shower seat;Grab bars - tub/shower;Wheelchair - IT trainer      Prior Function Prior Level of Function : Needs assist       Physical Assist : ADLs (physical)     Mobility  Comments: sister assists with ADL's, amb with RW or cane       Hand Dominance        Extremity/Trunk Assessment        Lower Extremity Assessment Lower Extremity Assessment: Generalized weakness    Cervical / Trunk Assessment Cervical / Trunk Assessment: Normal  Communication      Cognition Arousal/Alertness: Awake/alert Behavior During Therapy: WFL for tasks assessed/performed Overall Cognitive Status: Within Functional Limits for tasks assessed                                          General Comments      Exercises     Assessment/Plan    PT Assessment Patient needs continued PT services  PT Problem List Decreased strength;Decreased mobility;Decreased safety awareness;Decreased activity tolerance;Decreased balance;Cardiopulmonary status limiting activity       PT Treatment Interventions DME instruction;Therapeutic activities;Gait training;Therapeutic exercise;Patient/family education;Functional mobility training    PT Goals (Current goals can be found in the Care Plan section)  Acute Rehab PT Goals Patient Stated Goal: go home PT Goal Formulation: With patient Time For Goal Achievement: 11/07/21 Potential to Achieve Goals: Good    Frequency Min 3X/week     Co-evaluation               AM-PAC PT "6 Clicks" Mobility  Outcome Measure Help needed turning from your back to your side while in a flat bed without using bedrails?: None Help needed moving from lying on your back to sitting on the side of a flat bed without using bedrails?: None Help needed moving to and from a bed to a chair (including a wheelchair)?: A Little Help needed standing up from a chair using your arms (e.g., wheelchair or bedside chair)?: A Little Help needed to walk in hospital room?: A Little Help needed climbing 3-5 steps with a railing? : A Lot 6 Click Score: 19    End of Session Equipment Utilized During Treatment: Gait belt Activity Tolerance: Patient  limited by fatigue Patient left: in bed;with call bell/phone within reach;with bed alarm set Nurse Communication: Mobility status and SPO2 reading 70% initially, then up to 94% PT Visit Diagnosis: Unsteadiness on feet (R26.81);Difficulty in walking, not elsewhere classified (R26.2)    Time: 3710-6269 PT Time Calculation (min) (ACUTE ONLY): 32 min   Charges:   PT Evaluation $PT Eval Low Complexity: 1 Low PT Treatments $Gait Training: 8-22 mins        Blanchard Kelch PT Acute Rehabilitation Services Office [phone] Weekend [phone]   Rada Hay 10/24/2021, 1:19 PM

## 2021-10-24 NOTE — Progress Notes (Signed)
SATURATION QUALIFICATIONS: (This note is used to comply with regulatory documentation for home oxygen)  Patient Saturations on Room Air at Rest = 85%  Patient Saturations on Room Air while Ambulating = NA  Patient Saturations on 2 Liters of oxygen while Ambulating = 92%  Please briefly explain why patient needs home oxygen: Unable to maintain WFL saturations on RA. Patient's o2 sat dropped to 88% on 2 L with marching in place.

## 2021-10-24 NOTE — Evaluation (Signed)
Occupational Therapy Evaluation Patient Details Name: Shawna Smith MRN: [id number] DOB: 01-11-1956 Today's Date: 10/24/2021   History of Present Illness Shawna Smith is a 66 y.o. female with medical history significant of MS (Relapsing remitting subtype), insomnia, tobacco abuse, and h/o CVA, along with urinary retention- under the care of urology with a chronic foley catheter.  She presents to the ER with complaints of her catheter not draining properly and causing abdominal pain,She was found to have a leukocytosis, lactic acidosis, signs of UTI, and signs of possible pneumonia on CT   Clinical Impression   Shawna Smith is a 66 year old woman admitted to hospital with above medical history and presents with generalized weakness, decreased activity tolerance, impaired balance and decreased cardiopulmonary endurance. Patient found supine in bed and on RA without oxygen tubing in room. Patient's o2 sat 85% in supine on RA and dropped to 83% with patient just partially sitting up. Placed 2 L Waterproof on patient and o2 sat recovered .Overall patient needs setup and seated position for most ADLs to min assist for LB. Min guard to min assist for standing. Patient marched in place for 15 seconds with o2 sat dropping to 88%. Patient returned to supine. Therapist noted patient had flutter valve but no IS. Order for IS in chart. Therapist provided to patient and instructed on use. Patient will benefit from skilled OT services while in hospital to improve deficits and learn compensatory strategies as needed in order to return to PLOF.       Recommendations for follow up therapy are one component of a multi-disciplinary discharge planning process, led by the attending physician.  Recommendations may be updated based on patient status, additional functional criteria and insurance authorization.   Follow Up Recommendations  Home health OT    Assistance Recommended at Discharge Frequent or constant  Supervision/Assistance  Patient can return home with the following A little help with walking and/or transfers;A little help with bathing/dressing/bathroom;Assistance with cooking/housework;Help with stairs or ramp for entrance    Functional Status Assessment  Patient has had a recent decline in their functional status and demonstrates the ability to make significant improvements in function in a reasonable and predictable amount of time.  Equipment Recommendations  None recommended by OT    Recommendations for Other Services       Precautions / Restrictions Precautions Precautions: Fall Precaution Comments: monitor ats Restrictions Weight Bearing Restrictions: No      Mobility Bed Mobility Overal bed mobility: Modified Independent                  Transfers Overall transfer level: Needs assistance Equipment used: None Transfers: Sit to/from Stand Sit to Stand: Min guard                  Balance Overall balance assessment: Mild deficits observed, not formally tested                                         ADL either performed or assessed with clinical judgement   ADL Overall ADL's : Needs assistance/impaired Eating/Feeding: Independent   Grooming: Set up;Sitting   Upper Body Bathing: Set up;Sitting   Lower Body Bathing: Minimal assistance;Sit to/from stand   Upper Body Dressing : Set up;Sitting   Lower Body Dressing: Minimal assistance;Sit to/from stand   Toilet Transfer: BSC/3in1;Min guard   Toileting- Architect and Hygiene: Supervision/safety;Sitting/lateral  lean       Functional mobility during ADLs: Min guard General ADL Comments: Min guard to stand at side of bed holding on to bedrail. Able to march in place 15 seconds.     Vision Patient Visual Report: No change from baseline       Perception     Praxis      Pertinent Vitals/Pain Pain Assessment Pain Assessment: No/denies pain     Hand  Dominance Right   Extremity/Trunk Assessment Upper Extremity Assessment Upper Extremity Assessment: RUE deficits/detail;LUE deficits/detail RUE Deficits / Details: WFL ROM, grossly 4/5 strength, reports numbness in hand from MS RUE Sensation: WNL RUE Coordination: WNL LUE Deficits / Details: WFL ROM grossly 4-/5 shoulder strength otherwise 4/5 strength, numbness in hand LUE Sensation: WNL LUE Coordination: WNL (functional FMC. Numbness in hands)   Lower Extremity Assessment Lower Extremity Assessment: Defer to PT evaluation (reports bilateral feet numbness from MS)   Cervical / Trunk Assessment Cervical / Trunk Assessment: Normal   Communication Communication Communication: No difficulties   Cognition Arousal/Alertness: Awake/alert Behavior During Therapy: WFL for tasks assessed/performed Overall Cognitive Status: Within Functional Limits for tasks assessed                                       General Comments  Patient found on RA and supine in bed. When o2 sat checked patient 85% with partial sitting up patient 83%. No o2 tubing in room. Flutter valve noted. Patient's oxygen increased to 91-92% on 2 L after a couple of minutes. With standing and marching in place x 15 seconds o2 sat 88%. Improved to 94% back in supine on 2 L . Order for incentive spirometry in chart. Provided IS for patient and instructed on use.    Exercises     Shoulder Instructions      Home Living Family/patient expects to be discharged to:: Private residence Living Arrangements: Parent;Other relatives Available Help at Discharge: Family;Available PRN/intermittently Type of Home: House Home Access: Stairs to enter Entrance Stairs-Number of Steps: 1   Home Layout: One level     Bathroom Shower/Tub: Chief Strategy Officer: Handicapped height     Home Equipment: Agricultural consultant (2 wheels);BSC/3in1;Shower seat;Grab bars - tub/shower;Wheelchair - IT trainer           Prior Functioning/Environment Prior Level of Function : Needs assist       Physical Assist : ADLs (physical)     Mobility Comments: amb with RW or cane ADLs Comments: sister assists with ADL's,, reports she typically just transfers to [facility]        OT Problem List: Decreased strength;Decreased activity tolerance;Cardiopulmonary status limiting activity;Decreased knowledge of use of DME or AE      OT Treatment/Interventions: Self-care/ADL training;Therapeutic exercise;DME and/or AE instruction;Energy conservation;Therapeutic activities;Patient/family education;Balance training    OT Goals(Current goals can be found in the care plan section) Acute Rehab OT Goals Patient Stated Goal: be independent as possible OT Goal Formulation: With patient Time For Goal Achievement: 11/07/21 Potential to Achieve Goals: Good  OT Frequency: Min 2X/week    Co-evaluation              AM-PAC OT "6 Clicks" Daily Activity     Outcome Measure Help from another person eating meals?: None Help from another person taking care of personal grooming?: A Little Help from another person toileting, which includes using toliet, bedpan, or  urinal?: A Little Help from another person bathing (including washing, rinsing, drying)?: A Little Help from another person to put on and taking off regular upper body clothing?: A Little Help from another person to put on and taking off regular lower body clothing?: A Little 6 Click Score: 19   End of Session Equipment Utilized During Treatment: Oxygen Nurse Communication: Other (comment) (Reported on RA o2 sat and need for oxygen. Therapist retrieved IS gave to platient.)  Activity Tolerance: Patient tolerated treatment well Patient left: in bed;with call bell/phone within reach;with bed alarm set  OT Visit Diagnosis: Muscle weakness (generalized) (M62.81)                Time: 5277-8242 OT Time Calculation (min): 12 min Charges:  OT General  Charges $OT Visit: 1 Visit OT Evaluation $OT Eval Low Complexity: 1 Low  Rebeccah Ivins, OTR/L Acute Care Rehab Services  Office [phone] Pager: [phone]   Kelli Churn 10/24/2021, 3:31 PM

## 2021-10-24 NOTE — Plan of Care (Signed)

## 2021-10-24 NOTE — TOC Initial Note (Addendum)
Transition of Care [facility]) - Initial/Assessment Note    Patient Details  Name: Shawna Smith MRN: [id number] Date of Birth: 09-27-1955  Transition of Care [employer]) CM/SW Contact:    Lavenia Atlas, RN Phone Number: 10/24/2021, 10:04 AM  Clinical Narrative:   Patient uses [facility] in [place] [address]), needs PCP in [facility].        - 10:25a scheduled hospital follow up appointment on 11/04/21 at 11am with Dr. Julio Sicks with [facility].  TOC will continue to follow.     Prior Living Arrangements/Services Living arrangements for the past 2 months: Single Family Home Lives with:: Parents Patient language and need for interpreter reviewed:: Yes Do you feel safe going back to the place where you live?: Yes      Need for Family Participation in Patient Care: Yes (Comment) Care giver support system in place?: Yes (comment) Current home services: DME (has cane, walker, wheelchair, bedside commode. Patient unsure of DME supplier name) Criminal Activity/Legal Involvement Pertinent to Current Situation/Hospitalization: No - Comment as needed  Activities of Daily Living Home Assistive Devices/Equipment: Cane (specify quad or straight) ADL Screening (condition at time of admission) Patient's cognitive ability adequate to safely complete daily activities?: No Is the patient deaf or have difficulty hearing?: No Does the patient have difficulty seeing, even when wearing glasses/contacts?: No Does the patient have difficulty concentrating, remembering, or making decisions?: No Patient able to express need for assistance with ADLs?: Yes Does the patient have difficulty dressing or bathing?: No Independently performs ADLs?: Yes (appropriate for developmental age) Does the patient have difficulty walking or climbing stairs?: Yes Weakness of Legs: Both Weakness of Arms/Hands: None  Permission Sought/Granted   Permission granted to share information with : Yes, Verbal  Permission Granted  Share Information with NAME: case manager           Emotional Assessment Appearance:: Appears stated age Attitude/Demeanor/Rapport: Gracious Affect (typically observed): Accepting Orientation: : Oriented to Self, Oriented to Place, Oriented to  Time, Oriented to Situation   Psych Involvement: No (comment)  Admission diagnosis:  Complicated UTI (urinary tract infection) [N39.0] Patient Active Problem List   Diagnosis Date Noted   Complicated UTI (urinary tract infection) 10/22/2021   Alcoholism in remission (HCC) 09/30/2020   Protein-calorie malnutrition, severe 05/29/2020   Generalized weakness 05/28/2020   Acute respiratory failure with hypoxia (HCC) 05/28/2020   Multiple sclerosis (HCC) 05/28/2020   Hypokalemia 05/28/2020   Hypomagnesemia 05/28/2020   Prolonged QT interval 05/28/2020   Tobacco use 05/28/2020   Hand fracture, left 06/18/2018   Metacarpal bone fracture 06/18/2018   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 [address] Phone: [phone] Fax: [phone]     Social Determinants of Health (SDOH) Interventions    Readmission Risk Interventions     No data to display

## 2021-10-25 DIAGNOSIS — N390 Urinary tract infection, site not specified: Secondary | ICD-10-CM

## 2021-10-25 LAB — URINE CULTURE: Culture: >=100000 — AB

## 2021-10-25 MED ORDER — ADULT MULTIVITAMIN W/MINERALS CH
1.0000 | ORAL_TABLET | Freq: Every day | ORAL | Status: DC
Start: 2021-10-25 — End: 2021-10-27
  Administered 2021-10-25 – 2021-10-27 (×3): 1 via ORAL
  Filled 2021-10-25 (×3): qty 1

## 2021-10-25 MED ORDER — ENSURE ENLIVE PO LIQD
237.0000 mL | Freq: Two times a day (BID) | ORAL | Status: DC
  Administered 2021-10-25 – 2021-10-27 (×4): 237 mL via ORAL

## 2021-10-25 NOTE — Progress Notes (Signed)
PROGRESS NOTE    Shawna Smith  EHM:[id number] DOB: [birth year] DOA: 10/22/2021 PCP: Oneita Hurt, No    Brief Narrative:  Shawna Smith is a 67 y.o. female with medical history significant of MS (Relapsing remitting subtype), insomnia, tobacco abuse, and h/o CVA, along with urinary retention- under the care of urology with a chronic foley catheter.  She presents to the ER with complaints of her catheter not draining properly and causing abdominal pain.  Pain improved but not resolved after catheter placed.     In the ER, CT abdomen pelvis completed which shows a short segment of dilated proximal duodenum which may be transient in nature, cholelithiasis, diffuse urinary bladder wall thickening, stable dystrophic calcifications within the head of the pancreas which may represent a pancreatitis, pancreatic ductal dilation which represents a new finding when compared to prior, abrupt change in caliber of the proximal transverse colon without associated mass--per radiology will some of these finding to be transient in nature, short-term follow-up abdomen CT pelvis is recommended. She was found to have a leukocytosis, lactic acidosis, signs of UTI, and signs of possible pneumonia on CT so hospitalists were asked to admit   Assessment and Plan: Abdominal pain due to malfunctioning foley catheter -foley now working  -improvement in pain -PO pain meds if needed -has urology in [place]   UTI due to foley -culture pending- gram negative rods -on abx -prior culture Kleb Pna -follows with urology in [place]   CKD stage IIIa -CR improved   Abnormal CT scan Short segment of dilated proximal duodenum which may be transient in nature. Correlation with follow-up abdomen pelvis CT is recommended to determine any changes in bowel caliber, if a proximal small bowel obstruction is of clinical concern. Stable dystrophic calcifications within the head of the pancreas which may represent a pancreatitis.  Pancreatic ductal  dilatation which represents a new finding when compared to the prior study. Follow-up MRCP is recommended. Abrupt change in caliber of the proximal transverse colon without associated mass. While this may be transient in nature, short-term follow-up abdomen and pelvis CT is recommended. MS -will need outpatient follow up- updated sister -eating well, no abdominal pain   Hyponatremia -resolved  Hypokalemia -replete   Atelectasis with ? Of PNA -incentive spirometry   Tobacco abuse -encourage cessation   HTN -resume home meds as able   Elevated calcium -resolved with IVF   MS -follows wit neuro -resume home meds when able -PT/OT  Her O2 sat was documented as low-- she was not symptomatic and unclear if accurate-- will do home O2 study  DVT prophylaxis: enoxaparin (LOVENOX) injection 40 mg Start: 10/24/21 2200    Code Status: Full Code Family Communication:   Disposition Plan:  Level of care: Telemetry Status is: inpt Home 24-48 hours    Consultants:  none   Subjective: No complaints-- took herself off O2  Objective: Vitals:   10/24/21 1349 10/24/21 2050 10/25/21 0500 10/25/21 0855  BP: (!) 145/80 (!) 154/87 138/82 (!) 156/88  Pulse: (!) 108 100 97   Resp: 16 18 18    Temp: 98.9 F (37.2 C) 98.5 F (36.9 C) 98.7 F (37.1 C)   TempSrc: Oral Oral Oral   SpO2: 96% 96% 95%   Weight:      Height:        Intake/Output Summary (Last 24 hours) at 10/25/2021 1225 Last data filed at 10/24/2021 2247 Gross per 24 hour  Intake 1138.84 ml  Output 925 ml  Net 213.84 ml  Filed Weights   10/22/21 1441 10/23/21 1653  Weight: 49.9 kg 49.9 kg    Examination:   General: Appearance:    Thin female in no acute distress     Lungs:      respirations unlabored  Heart:    Normal heart rate.  MS:   All extremities are intact.    Neurologic:   Awake, alert       Data Reviewed: I have personally reviewed following labs and imaging studies  CBC: Recent Labs   Lab 10/22/21 1610 10/24/21 0434  WBC 11.3* 10.5  NEUTROABS 10.4*  --   HGB 16.0* 11.7*  HCT 43.2 32.6*  MCV 96.6 100.6*  PLT 282 208   Basic Metabolic Panel: Recent Labs  Lab 10/22/21 1610 10/24/21 0434  NA 131* 137  K 4.2 3.4*  CL 92* 107  CO2 25 22  GLUCOSE 157* 83  BUN 24* 12  CREATININE 1.51* 0.80  CALCIUM 10.6* 9.2   GFR: Estimated Creatinine Clearance: 56.7 mL/min (by C-G formula based on SCr of 0.8 mg/dL). Liver Function Tests: Recent Labs  Lab 10/22/21 1610  AST 25  ALT 14  ALKPHOS 80  BILITOT 0.9  PROT 8.1  ALBUMIN 4.3   Recent Labs  Lab 10/22/21 1610  LIPASE 74*   No results for input(s): "AMMONIA" in the last 168 hours. Coagulation Profile: No results for input(s): "INR", "PROTIME" in the last 168 hours. Cardiac Enzymes: No results for input(s): "CKTOTAL", "CKMB", "CKMBINDEX", "TROPONINI" in the last 168 hours. BNP (last 3 results) No results for input(s): "PROBNP" in the last 8760 hours. HbA1C: No results for input(s): "HGBA1C" in the last 72 hours. CBG: No results for input(s): "GLUCAP" in the last 168 hours. Lipid Profile: No results for input(s): "CHOL", "HDL", "LDLCALC", "TRIG", "CHOLHDL", "LDLDIRECT" in the last 72 hours. Thyroid Function Tests: No results for input(s): "TSH", "T4TOTAL", "FREET4", "T3FREE", "THYROIDAB" in the last 72 hours. Anemia Panel: No results for input(s): "VITAMINB12", "FOLATE", "FERRITIN", "TIBC", "IRON", "RETICCTPCT" in the last 72 hours. Sepsis Labs: Recent Labs  Lab 10/22/21 1755 10/23/21 0056  LATICACIDVEN 3.1* 1.7    Recent Results (from the past 240 hour(s))  SARS Coronavirus 2 by RT PCR (hospital order, performed in [facility] hospital lab) *cepheid single result test* Anterior Nasal Swab     Status: None   Collection Time: 10/22/21  4:02 PM   Specimen: Anterior Nasal Swab  Result Value Ref Range Status   SARS Coronavirus 2 by RT PCR NEGATIVE NEGATIVE Final    Comment: (NOTE) SARS-CoV-2  target nucleic acids are NOT DETECTED.  The SARS-CoV-2 RNA is generally detectable in upper and lower respiratory specimens during the acute phase of infection. The lowest concentration of SARS-CoV-2 viral copies this assay can detect is 250 copies / mL. A negative result does not preclude SARS-CoV-2 infection and should not be used as the sole basis for treatment or other patient management decisions.  A negative result may occur with improper specimen collection / handling, submission of specimen other than nasopharyngeal swab, presence of viral mutation(s) within the areas targeted by this assay, and inadequate number of viral copies (<250 copies / mL). A negative result must be combined with clinical observations, patient history, and epidemiological information.  Fact Sheet for Patients:   RoadLapTop.co.za  Fact Sheet for Healthcare Providers: http://kim-miller.com/  This test is not yet approved or  cleared by the [place] FDA and has been authorized for detection and/or diagnosis of SARS-CoV-2 by FDA under  an Emergency Use Authorization (EUA).  This EUA will remain in effect (meaning this test can be used) for the duration of the COVID-19 declaration under Section 564(b)(1) of the Act, 21 U.S.C. section 360bbb-3(b)(1), unless the authorization is terminated or revoked sooner.  Performed at [facility], [address] [place], [place] [address]   Urine Culture     Status: Abnormal   Collection Time: 10/22/21  4:12 PM   Specimen: Urine, Clean Catch  Result Value Ref Range Status   Specimen Description   Final    URINE, CLEAN CATCH Performed at [facility], [address] [place], [place] [address]    Special Requests   Final    NONE Performed at [facility], [address] [place], [place] [address]    Culture >=100,000 COLONIES/mL PROVIDENCIA RETTGERI (A)  Final   Report Status  10/25/2021 FINAL  Final   Organism ID, Bacteria PROVIDENCIA RETTGERI (A)  Final      Susceptibility   Providencia rettgeri - MIC*    AMPICILLIN RESISTANT Resistant     CEFAZOLIN >=64 RESISTANT Resistant     CEFEPIME <=0.12 SENSITIVE Sensitive     CEFTRIAXONE <=0.25 SENSITIVE Sensitive     CIPROFLOXACIN <=0.25 SENSITIVE Sensitive     GENTAMICIN <=1 SENSITIVE Sensitive     IMIPENEM 1 SENSITIVE Sensitive     NITROFURANTOIN 128 RESISTANT Resistant     TRIMETH/SULFA <=20 SENSITIVE Sensitive     AMPICILLIN/SULBACTAM <=2 SENSITIVE Sensitive     PIP/TAZO <=4 SENSITIVE Sensitive     * >=100,000 COLONIES/mL PROVIDENCIA RETTGERI  Blood culture (routine x 2)     Status: None (Preliminary result)   Collection Time: 10/22/21  5:55 PM   Specimen: Left Antecubital; Blood  Result Value Ref Range Status   Specimen Description   Final    LEFT ANTECUBITAL BLOOD Performed at [facility], [place] [address], [place], [place] [address]    Special Requests   Final    BOTTLES DRAWN AEROBIC AND ANAEROBIC Blood Culture adequate volume Performed at [facility], [address] [place], [place] [address]    Culture   Final    NO GROWTH 3 DAYS Performed at [facility], [place] [address] [place],  [address]    Report Status PENDING  Incomplete         Radiology Studies: No results found.      Scheduled Meds:  amLODipine  10 mg Oral Daily   aspirin EC  81 mg Oral Daily   Chlorhexidine Gluconate Cloth  6 each Topical Daily   enoxaparin (LOVENOX) injection  40 mg Subcutaneous Q24H   metoprolol succinate  50 mg Oral QHS   OXcarbazepine  150 mg Oral BID   Continuous Infusions:  cefTRIAXone (ROCEPHIN)  IV Stopped (10/24/21 1936)   doxycycline (VIBRAMYCIN) IV 100 mg (10/25/21 0859)     LOS: 1 day    Time spent: 45 minutes spent on chart review, discussion with nursing staff, consultants, updating family and interview/physical exam; more than 50% of that time was  spent in counseling and/or coordination of care.    Geradine Girt, DO [facility] Available via Epic secure chat 7am-7pm After these hours, please refer to coverage provider listed on [email or web address] 10/25/2021, 12:25 PM

## 2021-10-25 NOTE — Plan of Care (Signed)

## 2021-10-25 NOTE — Progress Notes (Addendum)
Initial Nutrition Assessment  DOCUMENTATION CODES:   Not applicable  INTERVENTION:   Ensure Enlive po BID, each supplement provides 350 kcal and 20 grams of protein.  MVI with minerals daily  NUTRITION DIAGNOSIS:   Inadequate oral intake related to decreased appetite as evidenced by per patient/family report.  GOAL:   Patient will meet greater than or equal to 90% of their needs  MONITOR:   PO intake, Supplement acceptance  REASON FOR ASSESSMENT:   Malnutrition Screening Tool    ASSESSMENT:   67 yo female admitted with abdominal pain and foley catheter not draining properly, UTI. PMH includes relapsing remitting MS, tobacco abuse, CVA, urinary retention with chronic foley catheter.  Unable to reach patient by phone.  On admission nutrition screen, patient reported weight loss and poor intake r/t decreased appetite. Weight history reviewed. No weight changes noted over the past year. Patient is at increased nutrition risk with BMI = 19 on the low end of normal range. Unsure if current weight is a stated weight or a weight carried over from a previous admission vs an actual weight.   Currently on a soft diet, meal intakes documented at 50-100%.  Labs reviewed. K 3.4 Medications reviewed.  NUTRITION - FOCUSED PHYSICAL EXAM:  Unable to complete, RD working remotely  Diet Order:   Diet Order             DIET SOFT Room service appropriate? Yes; Fluid consistency: Thin  Diet effective now                   EDUCATION NEEDS:   Not appropriate for education at this time  Skin:  Skin Assessment: Reviewed RN Assessment  Last BM:  8/24  Height:   Ht Readings from Last 1 Encounters:  10/23/21 5\' 3"  (1.6 m)    Weight:   Wt Readings from Last 1 Encounters:  10/23/21 49.9 kg    Ideal Body Weight:  52.3 kg  BMI:  Body mass index is 19.49 kg/m.  Estimated Nutritional Needs:   Kcal:  1550-1750  Protein:  70-80 gm  Fluid:  1.5-1.8 L   10/25/21  RD, LDN, CNSC Please refer to Amion for contact information.

## 2021-10-26 ENCOUNTER — Inpatient Hospital Stay (HOSPITAL_COMMUNITY): Payer: Medicare HMO

## 2021-10-26 DIAGNOSIS — N390 Urinary tract infection, site not specified: Secondary | ICD-10-CM | POA: Diagnosis not present

## 2021-10-26 LAB — BASIC METABOLIC PANEL
Anion gap: 8 (ref 5–15)
BUN: 6 mg/dL — ABNORMAL LOW (ref 8–23)
CO2: 24 mmol/L (ref 22–32)
Calcium: 9.4 mg/dL (ref 8.9–10.3)
Chloride: 104 mmol/L (ref 98–111)
Creatinine, Ser: 0.6 mg/dL (ref 0.44–1.00)
GFR, Estimated: >60 mL/min (ref >60)
Glucose, Bld: 106 mg/dL — ABNORMAL HIGH (ref 70–99)
Potassium: 3.1 mmol/L — ABNORMAL LOW (ref 3.5–5.1)
Sodium: 136 mmol/L (ref 135–145)

## 2021-10-26 LAB — CBC
HCT: 34 % — ABNORMAL LOW (ref 36.0–46.0)
Hemoglobin: 12.1 g/dL (ref 12.0–15.0)
MCH: 36.2 pg — ABNORMAL HIGH (ref 26.0–34.0)
MCHC: 35.6 g/dL (ref 30.0–36.0)
MCV: 101.8 fL — ABNORMAL HIGH (ref 80.0–100.0)
Platelets: 224 10*3/uL (ref 150–400)
RBC: 3.34 MIL/uL — ABNORMAL LOW (ref 3.87–5.11)
RDW: 15.7 % — ABNORMAL HIGH (ref 11.5–15.5)
WBC: 7.1 10*3/uL (ref 4.0–10.5)
nRBC: 0 % (ref 0.0–0.2)

## 2021-10-26 LAB — MAGNESIUM: Magnesium: 1.5 mg/dL — ABNORMAL LOW (ref 1.7–2.4)

## 2021-10-26 MED ORDER — CEFDINIR 300 MG PO CAPS
300.0000 mg | ORAL_CAPSULE | Freq: Two times a day (BID) | ORAL | Status: DC
  Administered 2021-10-26 – 2021-10-27 (×3): 300 mg via ORAL
  Filled 2021-10-26 (×3): qty 1

## 2021-10-26 MED ORDER — MAGNESIUM SULFATE 2 GM/50ML IV SOLN
2.0000 g | Freq: Once | INTRAVENOUS | Status: AC
Start: 2021-10-26 — End: 2021-10-26
  Administered 2021-10-26: 2 g via INTRAVENOUS
  Filled 2021-10-26: qty 50

## 2021-10-26 MED ORDER — DOXYCYCLINE HYCLATE 100 MG PO TABS
100.0000 mg | ORAL_TABLET | Freq: Two times a day (BID) | ORAL | Status: DC
  Administered 2021-10-26 – 2021-10-27 (×3): 100 mg via ORAL
  Filled 2021-10-26 (×3): qty 1

## 2021-10-26 MED ORDER — POTASSIUM CHLORIDE CRYS ER 20 MEQ PO TBCR
40.0000 meq | EXTENDED_RELEASE_TABLET | Freq: Once | ORAL | Status: AC
  Administered 2021-10-26: 40 meq via ORAL
  Filled 2021-10-26: qty 2

## 2021-10-26 MED ORDER — MAGIC MOUTHWASH
5.0000 mL | Freq: Three times a day (TID) | ORAL | Status: DC
Start: 2021-10-26 — End: 2021-10-27
  Administered 2021-10-26 – 2021-10-27 (×5): 5 mL via ORAL
  Filled 2021-10-26 (×6): qty 5

## 2021-10-26 NOTE — Plan of Care (Signed)
  Problem: Education: Goal: Knowledge of General Education information will improve Description: Including pain rating scale, medication(s)/side effects and non-pharmacologic comfort measures Outcome: Progressing   Problem: Clinical Measurements: Goal: Will remain free from infection Outcome: Progressing   Problem: Pain Managment: Goal: General experience of comfort will improve Outcome: Progressing   

## 2021-10-26 NOTE — Progress Notes (Addendum)
Occupational Therapy Treatment Patient Details Name: Shawna Smith MRN: [id number] DOB: 1955-10-13 Today's Date: 10/26/2021   History of present illness Shawna Smith is a 66 y.o. female with medical history significant of MS (Relapsing remitting subtype), insomnia, tobacco abuse, and h/o CVA, along with urinary retention- under the care of urology with a chronic foley catheter.  She presents to the ER with complaints of her catheter not draining properly and causing abdominal pain,She was found to have a leukocytosis, lactic acidosis, signs of UTI, and signs of possible pneumonia on CT   OT comments  Treatment focused on assessing patinet's activity tolerance and potential need for oxygen for return home. See o2 sat note. Patient mobilize well with walker with no overt loss of balance or physical assistance. Does not report dyspnea or have increased WOB. Will need oxygen at discharge. Encouraged hourly use of flutter valve and IS at home as well as in hospital. Patient verbalizes understanding.    Recommendations for follow up therapy are one component of a multi-disciplinary discharge planning process, led by the attending physician.  Recommendations may be updated based on patient status, additional functional criteria and insurance authorization.    Follow Up Recommendations  Home health OT    Assistance Recommended at Discharge Intermittent Supervision/Assistance  Patient can return home with the following  A little help with bathing/dressing/bathroom;Assistance with cooking/housework;Help with stairs or ramp for entrance   Equipment Recommendations  None recommended by OT    Recommendations for Other Services      Precautions / Restrictions Precautions Precautions: None Precaution Comments: monitor sats Restrictions Weight Bearing Restrictions: No       Mobility Bed Mobility Overal bed mobility: Modified Independent                  Transfers Overall transfer level:  Modified independent Equipment used: Rolling walker (2 wheels)               General transfer comment: Mod I to walk with walker with no overt loss of balance or difficulty using walker. Therapist assessed oxygen saturation with activity.     Balance Overall balance assessment: Mild deficits observed, not formally tested                                         ADL either performed or assessed with clinical judgement   ADL Overall ADL's : Needs assistance/impaired                                            Extremity/Trunk Assessment              Vision Patient Visual Report: No change from baseline     Perception     Praxis      Cognition Arousal/Alertness: Awake/alert Behavior During Therapy: WFL for tasks assessed/performed Overall Cognitive Status: Within Functional Limits for tasks assessed                                          Exercises      Shoulder Instructions       General Comments      Pertinent Vitals/ Pain  Pain Assessment Pain Assessment: 0-10 Pain Location: eyes Pain Intervention(s): Patient requesting pain meds-RN notified  Home Living                                          Prior Functioning/Environment              Frequency  Min 2X/week        Progress Toward Goals  OT Goals(current goals can now be found in the care plan section)  Progress towards OT goals: Progressing toward goals  Acute Rehab OT Goals Patient Stated Goal: be independent as possible OT Goal Formulation: With patient Time For Goal Achievement: 11/07/21 Potential to Achieve Goals: Good  Plan Discharge plan remains appropriate    Co-evaluation                 AM-PAC OT "6 Clicks" Daily Activity     Outcome Measure   Help from another person eating meals?: None Help from another person taking care of personal grooming?: None Help from another person  toileting, which includes using toliet, bedpan, or urinal?: A Little Help from another person bathing (including washing, rinsing, drying)?: A Little Help from another person to put on and taking off regular upper body clothing?: None Help from another person to put on and taking off regular lower body clothing?: A Little 6 Click Score: 21    End of Session Equipment Utilized During Treatment: Oxygen;Rolling walker (2 wheels)  OT Visit Diagnosis: Muscle weakness (generalized) (M62.81)   Activity Tolerance Patient tolerated treatment well   Patient Left in chair;with call bell/phone within reach   Nurse Communication  (o2 sats)        Time: 8676-7209 OT Time Calculation (min): 21 min  Charges: OT General Charges $OT Visit: 1 Visit OT Treatments $Therapeutic Activity: 8-22 mins  Mabell Esguerra, OTR/L Acute Care Rehab Services  Office [phone] Pager: [phone]   Kelli Churn 10/26/2021, 10:35 AM

## 2021-10-26 NOTE — Progress Notes (Signed)
PROGRESS NOTE    Shawna Smith  [id number] DOB: Jun 22, 1955 DOA: 10/22/2021 PCP: Merryl Hacker, No    Brief Narrative:  Shawna Smith is a 66 y.o. female with medical history significant of MS (Relapsing remitting subtype), insomnia, tobacco abuse, and h/o CVA, along with urinary retention- under the care of urology with a chronic foley catheter.  She presents to the ER with complaints of her catheter not draining properly and causing abdominal pain.  Pain improved but not resolved after catheter placed.     In the ER, CT abdomen pelvis completed which shows a short segment of dilated proximal duodenum which may be transient in nature, cholelithiasis, diffuse urinary bladder wall thickening, stable dystrophic calcifications within the head of the pancreas which may represent a pancreatitis, pancreatic ductal dilation which represents a new finding when compared to prior, abrupt change in caliber of the proximal transverse colon without associated mass--per radiology will some of these finding to be transient in nature, short-term follow-up abdomen CT pelvis is recommended. She was found to have a leukocytosis, lactic acidosis, signs of UTI, and signs of possible pneumonia on CT so hospitalists were asked to admit   Assessment and Plan: Abdominal pain due to malfunctioning foley catheter -foley now working  -improvement in pain -PO pain meds if needed -has urology in [place]   Acute respiratory failure with hypoxia -dropped to <88 while ambulating X ray shows atelectasis -aggressive pulm toilet -incentive spirometry  UTI due to foley -culture pending- gram negative rods -on abx -prior culture Kleb Pna -follows with urology in [place]   CKD stage IIIa -CR improved   Abnormal CT scan Short segment of dilated proximal duodenum which may be transient in nature. Correlation with follow-up abdomen pelvis CT is recommended to determine any changes in bowel caliber, if a proximal small bowel obstruction is  of clinical concern. Stable dystrophic calcifications within the head of the pancreas which may represent a pancreatitis.  Pancreatic ductal dilatation which represents a new finding when compared to the prior study. Follow-up MRCP is recommended. Abrupt change in caliber of the proximal transverse colon without associated mass. While this may be transient in nature, short-term follow-up abdomen and pelvis CT is recommended. MS -will need outpatient follow up- updated sister -eating well, no abdominal pain   Hyponatremia -resolved  Hypokalemia/hypomagnesemia -replete   Atelectasis with ? Of PNA -incentive spirometry   Tobacco abuse -encourage cessation   HTN -resume home meds as able   Elevated calcium -resolved with IVF   MS -follows wit neuro -resume home meds when able -PT/OT   DVT prophylaxis: enoxaparin (LOVENOX) injection 40 mg Start: 10/24/21 2200    Code Status: Full Code Family Communication:   Disposition Plan:  Level of care: Telemetry Status is: inpt Home 24-48 hours    Consultants:  none   Subjective: O2 sat dropped while ambulating  Objective: Vitals:   10/25/21 1424 10/25/21 2031 10/26/21 0623 10/26/21 0856  BP: 136/79 (!) 139/90 (!) 158/89 (!) 164/88  Pulse: 100 (!) 107 90   Resp: 18 19 18    Temp: 98.4 F (36.9 C) 98.4 F (36.9 C) 98.1 F (36.7 C)   TempSrc: Oral Oral Oral   SpO2: (!) 89% 96% 100%   Weight:      Height:        Intake/Output Summary (Last 24 hours) at 10/26/2021 1311 Last data filed at 10/26/2021 0500 Gross per 24 hour  Intake 1031.17 ml  Output 1850 ml  Net -818.83 ml  Filed Weights   10/22/21 1441 10/23/21 1653  Weight: 49.9 kg 49.9 kg    Examination:    General: Appearance:    Thin female in no acute distress     Lungs:      respirations unlabored  Heart:    Normal heart rate.    MS:   All extremities are intact.   Neurologic:   Awake, alert         Data Reviewed: I have personally  reviewed following labs and imaging studies  CBC: Recent Labs  Lab 10/22/21 1610 10/24/21 0434 10/26/21 0407  WBC 11.3* 10.5 7.1  NEUTROABS 10.4*  --   --   HGB 16.0* 11.7* 12.1  HCT 43.2 32.6* 34.0*  MCV 96.6 100.6* 101.8*  PLT 282 208 224   Basic Metabolic Panel: Recent Labs  Lab 10/22/21 1610 10/24/21 0434 10/26/21 0407 10/26/21 0831  NA 131* 137 136  --   K 4.2 3.4* 3.1*  --   CL 92* 107 104  --   CO2 25 22 24   --   GLUCOSE 157* 83 106*  --   BUN 24* 12 6*  --   CREATININE 1.51* 0.80 0.60  --   CALCIUM 10.6* 9.2 9.4  --   MG  --   --   --  1.5*   GFR: Estimated Creatinine Clearance: 56.7 mL/min (by C-G formula based on SCr of 0.6 mg/dL). Liver Function Tests: Recent Labs  Lab 10/22/21 1610  AST 25  ALT 14  ALKPHOS 80  BILITOT 0.9  PROT 8.1  ALBUMIN 4.3   Recent Labs  Lab 10/22/21 1610  LIPASE 74*   No results for input(s): "AMMONIA" in the last 168 hours. Coagulation Profile: No results for input(s): "INR", "PROTIME" in the last 168 hours. Cardiac Enzymes: No results for input(s): "CKTOTAL", "CKMB", "CKMBINDEX", "TROPONINI" in the last 168 hours. BNP (last 3 results) No results for input(s): "PROBNP" in the last 8760 hours. HbA1C: No results for input(s): "HGBA1C" in the last 72 hours. CBG: No results for input(s): "GLUCAP" in the last 168 hours. Lipid Profile: No results for input(s): "CHOL", "HDL", "LDLCALC", "TRIG", "CHOLHDL", "LDLDIRECT" in the last 72 hours. Thyroid Function Tests: No results for input(s): "TSH", "T4TOTAL", "FREET4", "T3FREE", "THYROIDAB" in the last 72 hours. Anemia Panel: No results for input(s): "VITAMINB12", "FOLATE", "FERRITIN", "TIBC", "IRON", "RETICCTPCT" in the last 72 hours. Sepsis Labs: Recent Labs  Lab 10/22/21 1755 10/23/21 0056  LATICACIDVEN 3.1* 1.7    Recent Results (from the past 240 hour(s))  SARS Coronavirus 2 by RT PCR (hospital order, performed in [facility] hospital lab) *cepheid single result  test* Anterior Nasal Swab     Status: None   Collection Time: 10/22/21  4:02 PM   Specimen: Anterior Nasal Swab  Result Value Ref Range Status   SARS Coronavirus 2 by RT PCR NEGATIVE NEGATIVE Final    Comment: (NOTE) SARS-CoV-2 target nucleic acids are NOT DETECTED.  The SARS-CoV-2 RNA is generally detectable in upper and lower respiratory specimens during the acute phase of infection. The lowest concentration of SARS-CoV-2 viral copies this assay can detect is 250 copies / mL. A negative result does not preclude SARS-CoV-2 infection and should not be used as the sole basis for treatment or other patient management decisions.  A negative result may occur with improper specimen collection / handling, submission of specimen other than nasopharyngeal swab, presence of viral mutation(s) within the areas targeted by this assay, and inadequate number of viral  copies (<250 copies / mL). A negative result must be combined with clinical observations, patient history, and epidemiological information.  Fact Sheet for Patients:   RoadLapTop.co.za  Fact Sheet for Healthcare Providers: http://kim-miller.com/  This test is not yet approved or  cleared by the [place] FDA and has been authorized for detection and/or diagnosis of SARS-CoV-2 by FDA under an Emergency Use Authorization (EUA).  This EUA will remain in effect (meaning this test can be used) for the duration of the COVID-19 declaration under Section 564(b)(1) of the Act, 21 U.S.C. section 360bbb-3(b)(1), unless the authorization is terminated or revoked sooner.  Performed at [facility], [address] [place], [place] [address]   Urine Culture     Status: Abnormal   Collection Time: 10/22/21  4:12 PM   Specimen: Urine, Clean Catch  Result Value Ref Range Status   Specimen Description   Final    URINE, CLEAN CATCH Performed at [facility], [address] [place], [place] [address]    Special Requests   Final    NONE Performed at [facility], [address] [place], [place] [address]    Culture >=100,000 COLONIES/mL PROVIDENCIA RETTGERI (A)  Final   Report Status 10/25/2021 FINAL  Final   Organism ID, Bacteria PROVIDENCIA RETTGERI (A)  Final      Susceptibility   Providencia rettgeri - MIC*    AMPICILLIN RESISTANT Resistant     CEFAZOLIN >=64 RESISTANT Resistant     CEFEPIME <=0.12 SENSITIVE Sensitive     CEFTRIAXONE <=0.25 SENSITIVE Sensitive     CIPROFLOXACIN <=0.25 SENSITIVE Sensitive     GENTAMICIN <=1 SENSITIVE Sensitive     IMIPENEM 1 SENSITIVE Sensitive     NITROFURANTOIN 128 RESISTANT Resistant     TRIMETH/SULFA <=20 SENSITIVE Sensitive     AMPICILLIN/SULBACTAM <=2 SENSITIVE Sensitive     PIP/TAZO <=4 SENSITIVE Sensitive     * >=100,000 COLONIES/mL PROVIDENCIA RETTGERI  Blood culture (routine x 2)     Status: None (Preliminary result)   Collection Time: 10/22/21  5:55 PM   Specimen: Left Antecubital; Blood  Result Value Ref Range Status   Specimen Description   Final    LEFT ANTECUBITAL BLOOD Performed at [facility], [address] [place], [place] [address]    Special Requests   Final    BOTTLES DRAWN AEROBIC AND ANAEROBIC Blood Culture adequate volume Performed at [facility], [address] [place], [place] [address]    Culture   Final    NO GROWTH 4 DAYS Performed at [facility], [address] [place], [place] [address]    Report Status PENDING  Incomplete         Radiology Studies: DG Chest 2 View  Result Date: 10/26/2021 CLINICAL DATA:  Hypoxia, cough EXAM: CHEST - 2 VIEW COMPARISON:  Previous chest radiograph done on 06/21/2021 and CT chest done on 10/22/2021 FINDINGS: Cardiac size is within normal limits. Thoracic aorta is tortuous. There are no signs of pulmonary edema. Linear densities are seen in the lower lung fields, more so on the right side suggesting  scarring or subsegmental atelectasis. Small right pleural effusion is seen. There is no pneumothorax. IMPRESSION: There is no focal pulmonary consolidation or signs of alveolar pulmonary edema. Linear densities are seen in the lower lung fields, more so on the right side suggesting scarring and subsegmental atelectasis. Small right pleural effusion. Electronically Signed  By: Elmer Picker M.D.   On: 10/26/2021 12:03        Scheduled Meds:  amLODipine  10 mg Oral Daily   aspirin EC  81 mg Oral Daily   cefdinir  300 mg Oral Q12H   Chlorhexidine Gluconate Cloth  6 each Topical Daily   doxycycline  100 mg Oral Q12H   enoxaparin (LOVENOX) injection  40 mg Subcutaneous Q24H   feeding supplement  237 mL Oral BID BM   magic mouthwash  5 mL Oral TID   metoprolol succinate  50 mg Oral QHS   multivitamin with minerals  1 tablet Oral Daily   OXcarbazepine  150 mg Oral BID   Continuous Infusions:  magnesium sulfate bolus IVPB 2 g (10/26/21 1222)     LOS: 2 days    Time spent: 45 minutes spent on chart review, discussion with nursing staff, consultants, updating family and interview/physical exam; more than 50% of that time was spent in counseling and/or coordination of care.    Geradine Girt, DO [facility] Available via Epic secure chat 7am-7pm After these hours, please refer to coverage provider listed on [email or web address] 10/26/2021, 1:11 PM

## 2021-10-26 NOTE — Progress Notes (Signed)
SATURATION QUALIFICATIONS: (This note is used to comply with regulatory documentation for home oxygen)  Patient Saturations on Room Air at Rest = 90%  Patient Saturations on Room Air while Ambulating = 86%  Patient Saturations on 2 L at Rest = 90-91% Patient Saturations on 3 L at Rest = 97%  Patient Saturations on 2 Liters of oxygen while Ambulating = 89%  Patient Saturations on 3 Liters of oxygen while Ambulating = 92%  Please briefly explain why patient needs home oxygen: Low o2 sats on room air and desats with ambulation. Did better on 3 L than 2 L.

## 2021-10-27 DIAGNOSIS — N390 Urinary tract infection, site not specified: Secondary | ICD-10-CM | POA: Diagnosis not present

## 2021-10-27 LAB — CULTURE, BLOOD (ROUTINE X 2)
Culture: NO GROWTH
Special Requests: ADEQUATE

## 2021-10-27 LAB — MAGNESIUM: Magnesium: 1.9 mg/dL (ref 1.7–2.4)

## 2021-10-27 LAB — BASIC METABOLIC PANEL
Anion gap: 11 (ref 5–15)
BUN: 9 mg/dL (ref 8–23)
CO2: 23 mmol/L (ref 22–32)
Calcium: 9.6 mg/dL (ref 8.9–10.3)
Chloride: 103 mmol/L (ref 98–111)
Creatinine, Ser: 0.62 mg/dL (ref 0.44–1.00)
GFR, Estimated: >60 mL/min (ref >60)
Glucose, Bld: 108 mg/dL — ABNORMAL HIGH (ref 70–99)
Potassium: 3.9 mmol/L (ref 3.5–5.1)
Sodium: 137 mmol/L (ref 135–145)

## 2021-10-27 LAB — CBC
HCT: 35.5 % — ABNORMAL LOW (ref 36.0–46.0)
Hemoglobin: 12.4 g/dL (ref 12.0–15.0)
MCH: 35.8 pg — ABNORMAL HIGH (ref 26.0–34.0)
MCHC: 34.9 g/dL (ref 30.0–36.0)
MCV: 102.6 fL — ABNORMAL HIGH (ref 80.0–100.0)
Platelets: 229 10*3/uL (ref 150–400)
RBC: 3.46 MIL/uL — ABNORMAL LOW (ref 3.87–5.11)
RDW: 15.7 % — ABNORMAL HIGH (ref 11.5–15.5)
WBC: 5.8 10*3/uL (ref 4.0–10.5)
nRBC: 0 % (ref 0.0–0.2)

## 2021-10-27 MED ORDER — CEFDINIR 300 MG PO CAPS: 300.0000 mg | ORAL_CAPSULE | Freq: Two times a day (BID) | ORAL | 0 refills | Status: DC

## 2021-10-27 MED ORDER — MAGIC MOUTHWASH: 5.0000 mL | Freq: Three times a day (TID) | ORAL | 0 refills | Status: DC

## 2021-10-27 MED ORDER — DOXYCYCLINE HYCLATE 100 MG PO TABS: 100.0000 mg | ORAL_TABLET | Freq: Two times a day (BID) | ORAL | 0 refills | Status: DC

## 2021-10-27 NOTE — Discharge Instructions (Signed)
Continue using incentive spirometry

## 2021-10-27 NOTE — Discharge Summary (Signed)
Physician Discharge Summary  Cheri Cains [id number] DOB: 03-05-1955 DOA: 10/22/2021  PCP: Karl Luke, MD  Admit date: 10/22/2021 Discharge date: 10/27/2021  Admitted From: home Discharge disposition: home   Recommendations for Outpatient Follow-Up:   Home health O2-- may be able to wean off with continued aggressive pulmonary toiletry (no PE and x ray shows atelectasis)-- outpatient PFTs Outpatient urology follow up to remove foley MRCP and CT scan outpatient -- see CT scan results   Discharge Diagnosis:   Principal Problem:   Complicated UTI (urinary tract infection) Active Problems:   Multiple sclerosis ([place])   Tobacco use   UTI (urinary tract infection)    Discharge Condition: Improved.  Diet recommendation: Regular.  Wound care: None.  Code status: Full.   History of Present Illness:   Shawna Smith is a 66 y.o. female with medical history significant of MS (Relapsing remitting subtype), insomnia, tobacco abuse, and h/o CVA, along with urinary retention- under the care of urology with a chronic foley catheter.  She presents to the ER with complaints of her catheter not draining properly and causing abdominal pain.  Pain improved but not resolved after catheter placed.     In the ER, CT abdomen pelvis completed which shows a short segment of dilated proximal duodenum which may be transient in nature, cholelithiasis, diffuse urinary bladder wall thickening, stable dystrophic calcifications within the head of the pancreas which may represent a pancreatitis, pancreatic ductal dilation which represents a new finding when compared to prior, abrupt change in caliber of the proximal transverse colon without associated mass--per radiology will some of these finding to be transient in nature, short-term follow-up abdomen CT pelvis is recommended. She was found to have a leukocytosis, lactic acidosis, signs of UTI, and signs of possible pneumonia on CT so hospitalists  were asked to admit   Hospital Course by Problem:   Abdominal pain due to malfunctioning foley catheter -foley now working  -improvement in pain -has urology in [place] for foley removal   Acute respiratory failure with hypoxia -dropped to <88 while ambulating X ray shows atelectasis -aggressive pulm toilet -will send home on O2- suspect can be weaned off -outpatient PFTs   UTI due to foley -culture-PROVIDENCIA RETTGERI -change to PO abx   CKD stage IIIa -CR improved   Abnormal CT scan Short segment of dilated proximal duodenum which may be transient in nature. Correlation with follow-up abdomen pelvis CT is recommended to determine any changes in bowel caliber, if a proximal small bowel obstruction is of clinical concern. Stable dystrophic calcifications within the head of the pancreas which may represent a pancreatitis.  Pancreatic ductal dilatation which represents a new finding when compared to the prior study. Follow-up MRCP is recommended. Abrupt change in caliber of the proximal transverse colon without associated mass. While this may be transient in nature, short-term follow-up abdomen and pelvis CT is recommended. MS -will need outpatient follow up- updated sister -eating well, no abdominal pain   Hyponatremia -resolved   Hypokalemia/hypomagnesemia -repleted   Atelectasis with ? Of PNA -incentive spirometry   Tobacco abuse -encourage cessation   HTN -resume home meds as able   Elevated calcium -resolved with IVF   MS -follows wit neuro -resume home meds when able -PT/OT    Medical Consultants:   none   Discharge Exam:   Vitals:   10/27/21 0419 10/27/21 1135  BP: (!) 154/94   Pulse: 78   Resp: 18 17  Temp: 97.9 F (36.6  C)   SpO2: 94%    Vitals:   10/26/21 1547 10/26/21 2019 10/27/21 0419 10/27/21 1135  BP: (!) 142/91 (!) 163/91 (!) 154/94   Pulse: 96 100 78   Resp: 18 18 18 17   Temp: 98.2 F (36.8 C) 98.6 F (37 C) 97.9 F  (36.6 C)   TempSrc: Oral Oral Oral   SpO2: 93% 94% 94%   Weight:      Height:        General exam: Appears calm and comfortable.     The results of significant diagnostics from this hospitalization (including imaging, microbiology, ancillary and laboratory) are listed below for reference.     Procedures and Diagnostic Studies:   CT ABDOMEN PELVIS W CONTRAST  Result Date: 10/22/2021 CLINICAL DATA:  Abdominal pain. EXAM: CT ABDOMEN AND PELVIS WITH CONTRAST TECHNIQUE: Multidetector CT imaging of the abdomen and pelvis was performed using the standard protocol following bolus administration of intravenous contrast. RADIATION DOSE REDUCTION: This exam was performed according to the departmental dose-optimization program which includes automated exposure control, adjustment of the mA and/or kV according to patient size and/or use of iterative reconstruction technique. CONTRAST:  128mL OMNIPAQUE IOHEXOL 350 MG/ML SOLN COMPARISON:  June 21, 2021 FINDINGS: Lower chest: Mild atelectasis is seen within the bilateral lung bases. Hepatobiliary: No focal liver abnormality is seen. Subcentimeter gallstones are seen within the lumen of an otherwise normal-appearing gallbladder. There is no evidence of biliary dilatation. Pancreas: The main pancreatic duct is prominent and measures 5.8 mm in diameter (measured approximately 2.5 mm on the prior study). Numerous stable dystrophic calcifications are seen within the head of the pancreas. Diffuse atrophic changes are seen throughout the body and tail of the pancreas. Spleen: The spleen is small in size and otherwise unremarkable. Adrenals/Urinary Tract: Adrenal glands are unremarkable. Kidneys are normal in size, without renal calculi or hydronephrosis. A 1.5 cm simple cyst is seen within the lateral aspect of the mid left kidney. An additional 4 mm focus of parenchymal low attenuation is seen within the mid right kidney. This is too small to characterize by CT  examination. A Foley catheter is seen within an empty urinary bladder. Diffuse urinary bladder wall thickening is noted. Stomach/Bowel: The stomach is moderately distended. There is dilatation of a short segment of the proximal duodenum, involving predominately the proximal portion horizontal part. A transition zone is seen as the duodenum crosses the midline (axial CT images 38 through 44, CT series 2). The appendix is not clearly identified. A 13 mm x 6 mm focus of air, possibly representing a diverticulum, is seen within the anterior wall of the mid transverse colon (axial CT image 64, CT series 2). This portion of large bowel is redundant in appearance. An abrupt change in large bowel caliber is also seen within the proximal transverse colon (axial CT images 56 through 60, CT series 2/coronal reformatted images 50 through 54, CT series 5). No mass lesions are identified within this region. Stool is seen throughout the large bowel with noninflamed diverticula seen within the sigmoid colon. Vascular/Lymphatic: Aortic atherosclerosis. No enlarged abdominal or pelvic lymph nodes. Reproductive: A 1.4 cm diameter heterogeneous uterine fibroid is noted within the uterine fundus. The bilateral adnexa are unremarkable. Other: No abdominal wall hernia or abnormality. No abdominopelvic ascites. Musculoskeletal: A stable sclerotic lesion is seen within the right pedicle of the L1 vertebral body. Multilevel degenerative changes are seen throughout the lumbar spine. IMPRESSION: 1. Short segment of dilated proximal duodenum which may be  transient in nature. Correlation with follow-up abdomen pelvis CT is recommended to determine any changes in bowel caliber, if a proximal small bowel obstruction is of clinical concern. 2. Cholelithiasis. 3. Sigmoid diverticulosis. 4. Diffuse urinary bladder wall thickening which may be, in part, due to poor bladder distention. Sequelae associated with acute cystitis cannot completely be  excluded. Correlation with urinalysis is recommended. 5. Stable dystrophic calcifications within the head of the pancreas which may represent a pancreatitis. 6. Pancreatic ductal dilatation which represents a new finding when compared to the prior study. Follow-up MRCP is recommended. 7. Abrupt change in caliber of the proximal transverse colon without associated mass. While this may be transient in nature, short-term follow-up abdomen and pelvis CT is recommended. 8. Aortic atherosclerosis. Aortic Atherosclerosis (ICD10-I70.0). Electronically Signed   By: Aram Candela M.D.   On: 10/22/2021 18:20   CT Angio Chest PE W and/or Wo Contrast  Result Date: 10/22/2021 CLINICAL DATA:  Tachycardia, hypoxia, high clinical suspicion for PE EXAM: CT ANGIOGRAPHY CHEST WITH CONTRAST TECHNIQUE: Multidetector CT imaging of the chest was performed using the standard protocol during bolus administration of intravenous contrast. Multiplanar CT image reconstructions and MIPs were obtained to evaluate the vascular anatomy. RADIATION DOSE REDUCTION: This exam was performed according to the departmental dose-optimization program which includes automated exposure control, adjustment of the mA and/or kV according to patient size and/or use of iterative reconstruction technique. CONTRAST:  OMNIPAQUE IOHEXOL 350 MG/ML SOLN COMPARISON:  Chest radiograph done on 06/21/2021, CTA done on 05/28/2020 FINDINGS: Cardiovascular: There is homogeneous enhancement in thoracic aorta. There is no demonstrable intimal flap. Scattered atherosclerotic plaques and calcifications are seen in thoracic aorta and its major branches. Coronary artery calcifications are seen. There are no intraluminal filling defects in central pulmonary artery branches. Evaluation of small peripheral branches is limited by infiltrates in the lower lung fields. Mediastinum/Nodes: No significant lymphadenopathy is seen. Lungs/Pleura: There are linear patchy densities in  both lower lung fields, more so on the right side. There is no focal consolidation. There is interval clearing of bilateral pleural effusions. There is no pneumothorax. Musculoskeletal: Mixed density lesion seen in L1 vertebra has not changed significantly. Review of the MIP images confirms the above findings. IMPRESSION: There is no evidence of pulmonary artery embolism. There is no evidence of thoracic aortic dissection. Linear patchy densities in both lower lung fields suggest atelectasis/pneumonia. There is no focal consolidation. There is no pleural effusion. Other findings as described in the body of the report. Electronically Signed   By: Ernie Avena M.D.   On: 10/22/2021 17:31     Labs:   Basic Metabolic Panel: Recent Labs  Lab 10/22/21 1610 10/24/21 0434 10/26/21 0407 10/26/21 0831 10/27/21 0350  NA 131* 137 136  --  137  K 4.2 3.4* 3.1*  --  3.9  CL 92* 107 104  --  103  CO2 25 22 24   --  23  GLUCOSE 157* 83 106*  --  108*  BUN 24* 12 6*  --  9  CREATININE 1.51* 0.80 0.60  --  0.62  CALCIUM 10.6* 9.2 9.4  --  9.6  MG  --   --   --  1.5* 1.9   GFR Estimated Creatinine Clearance: 56.7 mL/min (by C-G formula based on SCr of 0.62 mg/dL). Liver Function Tests: Recent Labs  Lab 10/22/21 1610  AST 25  ALT 14  ALKPHOS 80  BILITOT 0.9  PROT 8.1  ALBUMIN 4.3   Recent Labs  Lab 10/22/21 1610  LIPASE 74*   No results for input(s): "AMMONIA" in the last 168 hours. Coagulation profile No results for input(s): "INR", "PROTIME" in the last 168 hours.  CBC: Recent Labs  Lab 10/22/21 1610 10/24/21 0434 10/26/21 0407 10/27/21 0350  WBC 11.3* 10.5 7.1 5.8  NEUTROABS 10.4*  --   --   --   HGB 16.0* 11.7* 12.1 12.4  HCT 43.2 32.6* 34.0* 35.5*  MCV 96.6 100.6* 101.8* 102.6*  PLT 282 208 224 229   Cardiac Enzymes: No results for input(s): "CKTOTAL", "CKMB", "CKMBINDEX", "TROPONINI" in the last 168 hours. BNP: Invalid input(s): "POCBNP" CBG: No results for  input(s): "GLUCAP" in the last 168 hours. D-Dimer No results for input(s): "DDIMER" in the last 72 hours. Hgb A1c No results for input(s): "HGBA1C" in the last 72 hours. Lipid Profile No results for input(s): "CHOL", "HDL", "LDLCALC", "TRIG", "CHOLHDL", "LDLDIRECT" in the last 72 hours. Thyroid function studies No results for input(s): "TSH", "T4TOTAL", "T3FREE", "THYROIDAB" in the last 72 hours.  Invalid input(s): "FREET3" Anemia work up No results for input(s): "VITAMINB12", "FOLATE", "FERRITIN", "TIBC", "IRON", "RETICCTPCT" in the last 72 hours. Microbiology Recent Results (from the past 240 hour(s))  SARS Coronavirus 2 by RT PCR (hospital order, performed in [facility] hospital lab) *cepheid single result test* Anterior Nasal Swab     Status: None   Collection Time: 10/22/21  4:02 PM   Specimen: Anterior Nasal Swab  Result Value Ref Range Status   SARS Coronavirus 2 by RT PCR NEGATIVE NEGATIVE Final    Comment: (NOTE) SARS-CoV-2 target nucleic acids are NOT DETECTED.  The SARS-CoV-2 RNA is generally detectable in upper and lower respiratory specimens during the acute phase of infection. The lowest concentration of SARS-CoV-2 viral copies this assay can detect is 250 copies / mL. A negative result does not preclude SARS-CoV-2 infection and should not be used as the sole basis for treatment or other patient management decisions.  A negative result may occur with improper specimen collection / handling, submission of specimen other than nasopharyngeal swab, presence of viral mutation(s) within the areas targeted by this assay, and inadequate number of viral copies (<250 copies / mL). A negative result must be combined with clinical observations, patient history, and epidemiological information.  Fact Sheet for Patients:   RoadLapTop.co.za  Fact Sheet for Healthcare Providers: http://kim-miller.com/  This test is not yet approved  or  cleared by the [place] FDA and has been authorized for detection and/or diagnosis of SARS-CoV-2 by FDA under an Emergency Use Authorization (EUA).  This EUA will remain in effect (meaning this test can be used) for the duration of the COVID-19 declaration under Section 564(b)(1) of the Act, 21 U.S.C. section 360bbb-3(b)(1), unless the authorization is terminated or revoked sooner.  Performed at [facility], [address], [place], [place] [address]   Urine Culture     Status: Abnormal   Collection Time: 10/22/21  4:12 PM   Specimen: Urine, Clean Catch  Result Value Ref Range Status   Specimen Description   Final    URINE, CLEAN CATCH Performed at [facility], [address] [place], [place] [address]    Special Requests   Final    NONE Performed at [facility], [address] [place], [place] [address]    Culture >=100,000 COLONIES/mL PROVIDENCIA RETTGERI (A)  Final   Report Status 10/25/2021 FINAL  Final   Organism ID, Bacteria PROVIDENCIA  RETTGERI (A)  Final      Susceptibility   Providencia rettgeri - MIC*    AMPICILLIN RESISTANT Resistant     CEFAZOLIN >=64 RESISTANT Resistant     CEFEPIME <=0.12 SENSITIVE Sensitive     CEFTRIAXONE <=0.25 SENSITIVE Sensitive     CIPROFLOXACIN <=0.25 SENSITIVE Sensitive     GENTAMICIN <=1 SENSITIVE Sensitive     IMIPENEM 1 SENSITIVE Sensitive     NITROFURANTOIN 128 RESISTANT Resistant     TRIMETH/SULFA <=20 SENSITIVE Sensitive     AMPICILLIN/SULBACTAM <=2 SENSITIVE Sensitive     PIP/TAZO <=4 SENSITIVE Sensitive     * >=100,000 COLONIES/mL PROVIDENCIA RETTGERI  Blood culture (routine x 2)     Status: None   Collection Time: 10/22/21  5:55 PM   Specimen: Left Antecubital; Blood  Result Value Ref Range Status   Specimen Description   Final    LEFT ANTECUBITAL BLOOD Performed at [facility], [place] [address] [place], [place] [address]    Special Requests   Final    BOTTLES DRAWN  AEROBIC AND ANAEROBIC Blood Culture adequate volume Performed at [facility], [address] [place], [place] [address]    Culture   Final    NO GROWTH 5 DAYS Performed at [facility], [place] [address], [place], [place] [address]    Report Status 10/27/2021 FINAL  Final     Discharge Instructions:   Discharge Instructions     Diet - low sodium heart healthy   Complete by: As directed    Discharge instructions   Complete by: As directed    Follow up with urology for foley removal Follow up with PCP for repeat CT scan of abdomen and MRCP   Increase activity slowly   Complete by: As directed       Allergies as of 10/27/2021       Reactions   Ace Inhibitors Swelling, Other (See Comments)   Site of swelling not noted   Penicillins Shortness Of Breath, Swelling   Amoxicillin Swelling   Sulfa Antibiotics Itching        Medication List     STOP taking these medications    gabapentin 300 MG capsule Commonly known as: NEURONTIN   hydrALAZINE 50 MG tablet Commonly known as: APRESOLINE   methocarbamol 500 MG tablet Commonly known as: ROBAXIN   potassium chloride SA 20 MEQ tablet Commonly known as: KLOR-CON M   tamsulosin 0.4 MG Caps capsule Commonly known as: FLOMAX   traMADol 50 MG tablet Commonly known as: ULTRAM   traZODone 50 MG tablet Commonly known as: DESYREL       TAKE these medications    acetaminophen 500 MG tablet Commonly known as: TYLENOL Take 500 mg by mouth every 4 (four) hours as needed (pain).   albuterol 108 (90 Base) MCG/ACT inhaler Commonly known as: VENTOLIN HFA Inhale 3 puffs into the lungs 3 (three) times daily as needed for wheezing or shortness of breath.   amitriptyline 50 MG tablet Commonly known as: ELAVIL Take 50 mg by mouth at bedtime.   amLODipine 10 MG tablet Commonly known as: NORVASC Take 10 mg by mouth at bedtime.   ascorbic Acid 500 MG Cpcr Commonly known as: VITAMIN C Take 500 mg by mouth  daily.   aspirin EC 81 MG tablet Take 81 mg by mouth daily.   Aubagio 14 MG Tabs Generic drug: Teriflunomide Take 14 mg by mouth daily.   cefdinir 300 MG capsule  Commonly known as: OMNICEF Take 1 capsule (300 mg total) by mouth every 12 (twelve) hours.   doxycycline 100 MG tablet Commonly known as: VIBRA-TABS Take 1 tablet (100 mg total) by mouth every 12 (twelve) hours.   Ensure Plus Liqd Take 237 mLs by mouth 3 (three) times daily between meals.   EPINEPHrine 0.3 mg/0.3 mL Soaj injection Commonly known as: EPI-PEN Inject 0.3 mg into the muscle once as needed for anaphylaxis.   ergocalciferol 1.25 MG (50000 UT) capsule Commonly known as: VITAMIN D2 Take 50,000 Units by mouth once a week.   Fish Oil 1000 MG Caps Take 1,000 mg by mouth daily.   losartan-hydrochlorothiazide 50-12.5 MG tablet Commonly known as: HYZAAR Take 1 tablet by mouth at bedtime.   magic mouthwash Soln Take 5 mLs by mouth 3 (three) times daily.   metoprolol succinate 100 MG 24 hr tablet Commonly known as: TOPROL-XL Take 100 mg by mouth daily.   OXcarbazepine 150 MG tablet Commonly known as: TRILEPTAL Take 150 mg by mouth in the morning and at bedtime.               Durable Medical Equipment  (From admission, onward)           Start     Ordered   10/27/21 1149  For home use only DME oxygen  Once       Question Answer Comment  Length of Need 6 Months   Mode or (Route) Nasal cannula   Liters per Minute 2   Oxygen delivery system Gas      10/27/21 1148            Follow-up Information     Benito Mccreedy, MD. Go on 11/04/2021.   Specialty: Internal Medicine Why: Appointment scheduled with [facility] on 11/04/21 at 11am. Please bring dischage paperwork, medication list and insurance card with you. If you are unable to make the appointment please call the office at [phone]. Contact information: [id number] [place] [address] [phone]          [facility], [place] Follow up.   Specialty: [place] Why: A representative with [facility] will contact you within 24-48 hours from your hospital discharge to set up home health services. Contact information: [address] [place] [address] [phone]                  Time coordinating discharge: 45 min  Signed:  Geradine Girt DO  [facility] 10/27/2021, 2:40 PM

## 2021-10-27 NOTE — TOC Progression Note (Addendum)
Transition of Care [facility]) - Progression Note    Patient Details  Name: Jayne Peckenpaugh MRN: [id number] Date of Birth: 20-May-1955  Transition of [facility]) CM/SW Contact  Lavenia Atlas, RN Phone Number: 10/27/2021, 12:25 PM  Clinical Narrative:   RNCM spoke with patient to get current [facility] agency name. Patient reports currently with [facility], did not have the phone number however will get and provide to this RNCM.  TOC will continue to follow.  - 12:39 This RNCM spoke with [place] with [facility] (ph#[phone]) who provides HHA services for the patient.   RNCM spoke with pt via phone regarding discharge planning for Home Health Services. Offered pt medicare.gov list of home health agencies to choose from.  Pt chose [facility] to render services. Brandi of [facility] notified. Patient made aware that [facility] will be in contact in 24-48 hours.  No DME needs identified at this time.   - 2.39pm Danielle with [employer] is working on home oxygen orders, will deliver as soon as possible. This RNCM spoke with the patient who agreed to wait until oxygen is delivered.  TOC will continue to follow.      Expected Discharge Plan: Home w Home Health Services Barriers to Discharge: Continued Medical Work up  Expected Discharge Plan and Services Expected Discharge Plan: Home w Home Health Services In-house Referral: NA Discharge Planning Services: CM Consult Post Acute Care Choice: Home Health Living arrangements for the past 2 months: Single Family Home Expected Discharge Date: 10/27/21                         HH Arranged: OT, PT HH Agency: Other - See comment (Patient reports she has home health services with [facility] in [place].)         Social Determinants of Health (SDOH) Interventions    Readmission Risk Interventions     No data to display

## 2021-10-27 NOTE — Care Management Important Message (Signed)
Important Message  Patient Details IM Letter given to the Patient. Name: Shawna Smith MRN: [id number] Date of Birth: 10-Mar-1955   Medicare Important Message Given:  Yes     Caren Macadam 10/27/2021, 12:52 PM

## 2021-10-27 NOTE — Plan of Care (Signed)

## 2021-10-27 NOTE — Progress Notes (Signed)
SATURATION QUALIFICATIONS: (This note is used to comply with regulatory documentation for home oxygen)   Patient Saturations on Room Air at Rest = 91%   Patient Saturations on Room Air while Ambulating = 88%   Patient Saturations on 2 L at Rest = 93%   Patient Saturations on 2 Liters of oxygen while Ambulating = 92%   Please briefly explain why patient needs home oxygen:  Low o2 sats on room air and desats with ambulation. Did better on 3 L than 2 L.

## 2021-10-30 ENCOUNTER — Other Ambulatory Visit: Payer: Self-pay | Admitting: Family Medicine

## 2021-10-30 MED ORDER — MAGIC MOUTHWASH: 5.0000 mL | Freq: Three times a day (TID) | ORAL | 1 refills | Status: DC

## 2021-10-30 MED ORDER — MAGIC MOUTHWASH: 5.0000 mL | Freq: Three times a day (TID) | ORAL | Status: AC

## 2021-11-19 ENCOUNTER — Emergency Department (HOSPITAL_BASED_OUTPATIENT_CLINIC_OR_DEPARTMENT_OTHER)
Admission: EM | Admit: 2021-11-19 | Discharge: 2021-11-19 | Disposition: A | Discharge status: Home / Self Care | Payer: Medicare HMO | Attending: Emergency Medicine | Admitting: Emergency Medicine

## 2021-11-19 ENCOUNTER — Other Ambulatory Visit: Payer: Self-pay

## 2021-11-19 DIAGNOSIS — R339 Retention of urine, unspecified: Secondary | ICD-10-CM | POA: Diagnosis not present

## 2021-11-19 DIAGNOSIS — I10 Essential (primary) hypertension: Secondary | ICD-10-CM | POA: Insufficient documentation

## 2021-11-19 DIAGNOSIS — Z7982 Long term (current) use of aspirin: Secondary | ICD-10-CM | POA: Diagnosis not present

## 2021-11-19 DIAGNOSIS — J449 Chronic obstructive pulmonary disease, unspecified: Secondary | ICD-10-CM | POA: Diagnosis not present

## 2021-11-19 DIAGNOSIS — N390 Urinary tract infection, site not specified: Secondary | ICD-10-CM | POA: Diagnosis not present

## 2021-11-19 DIAGNOSIS — Z79899 Other long term (current) drug therapy: Secondary | ICD-10-CM | POA: Insufficient documentation

## 2021-11-19 DIAGNOSIS — E871 Hypo-osmolality and hyponatremia: Secondary | ICD-10-CM | POA: Insufficient documentation

## 2021-11-19 DIAGNOSIS — R1030 Lower abdominal pain, unspecified: Secondary | ICD-10-CM | POA: Diagnosis present

## 2021-11-19 LAB — URINALYSIS, ROUTINE W REFLEX MICROSCOPIC
Bilirubin Urine: NEGATIVE
Glucose, UA: NEGATIVE mg/dL
Ketones, ur: NEGATIVE mg/dL
Nitrite: NEGATIVE
Protein, ur: 30 mg/dL — AB
Specific Gravity, Urine: 1.015 (ref 1.005–1.030)
pH: 7.5 (ref 5.0–8.0)

## 2021-11-19 LAB — COMPREHENSIVE METABOLIC PANEL
ALT: 30 U/L (ref 0–44)
AST: 28 U/L (ref 15–41)
Albumin: 4.3 g/dL (ref 3.5–5.0)
Alkaline Phosphatase: 104 U/L (ref 38–126)
Anion gap: 10 (ref 5–15)
BUN: 10 mg/dL (ref 8–23)
CO2: 25 mmol/L (ref 22–32)
Calcium: 9.6 mg/dL (ref 8.9–10.3)
Chloride: 91 mmol/L — ABNORMAL LOW (ref 98–111)
Creatinine, Ser: 0.64 mg/dL (ref 0.44–1.00)
GFR, Estimated: >60 mL/min (ref >60)
Glucose, Bld: 120 mg/dL — ABNORMAL HIGH (ref 70–99)
Potassium: 3.7 mmol/L (ref 3.5–5.1)
Sodium: 126 mmol/L — ABNORMAL LOW (ref 135–145)
Total Bilirubin: 0.3 mg/dL (ref 0.3–1.2)
Total Protein: 8.2 g/dL — ABNORMAL HIGH (ref 6.5–8.1)

## 2021-11-19 LAB — CBC
HCT: 38.4 % (ref 36.0–46.0)
Hemoglobin: 13.8 g/dL (ref 12.0–15.0)
MCH: 35.1 pg — ABNORMAL HIGH (ref 26.0–34.0)
MCHC: 35.9 g/dL (ref 30.0–36.0)
MCV: 97.7 fL (ref 80.0–100.0)
Platelets: 331 10*3/uL (ref 150–400)
RBC: 3.93 MIL/uL (ref 3.87–5.11)
RDW: 14 % (ref 11.5–15.5)
WBC: 10.3 10*3/uL (ref 4.0–10.5)
nRBC: 0 % (ref 0.0–0.2)

## 2021-11-19 LAB — URINALYSIS, MICROSCOPIC (REFLEX)

## 2021-11-19 LAB — LIPASE, BLOOD: Lipase: 25 U/L (ref 11–51)

## 2021-11-19 MED ORDER — CEFDINIR 300 MG PO CAPS: 300.0000 mg | ORAL_CAPSULE | Freq: Two times a day (BID) | ORAL | 0 refills | Status: AC

## 2021-11-19 NOTE — ED Notes (Signed)
First contact with Patient. Patient c/o lower abd pain/bladder pain x today. Patient states her catheter isn't draining properly.  Foley catheter removed - Patient assisted up to [facility] - 23ml noted out. Patient bladder scanned. Over 999 was scanned. New foley catheter inserted. Immediately noted 467ml's drained.  Sample collected and sent. IV established and blood sent to lab. MD made aware. Patient states she is feeling much better and is going to try and get some rest. Lights dimmed for comfort.

## 2021-11-19 NOTE — ED Provider Notes (Signed)
[facility] Provider Note   CSN: [id number] Arrival date & time: 11/19/21  7096     History  Chief Complaint  Patient presents with   Abdominal Pain    Pt arrives via POV with family member- c/o sudden onset abd pain that began approx 1700hrs day pta and associated diarrhea that began approx 2000hrs day pta- pt attributes pain to Indwelling Foley complication; noticed after abd pain started that catheter wasn't draining properly- Foley placed 10/22/2021.   Pt denies n/v; denies fever/chills     Shawna Smith is a 66 y.o. female.   Abdominal Pain    Patient has a history of respiratory failure with hypoxia, pancreatitis, multiple sclerosis COPD, hypertension, urinary tract infection, stroke who presents with lower abdominal pain.  Patient states she is had a catheter since being in the hospital the end of August.  Patient states she started having abdominal pain yesterday.  She has had some diarrhea as well.  Patient feels like her Foley catheter is not draining properly.  She feels the pain is related to that.  She has not had any fevers.  No vomiting.  Patient is requesting the catheter be removed .  Prior records reviewed.  Patient was admitted to the hospital in August 23.  She was discharged on the 28th.  Patient was noted to have a complicated urinary tract infection.  At that time patient presented to the ED with similar complaints.  She had her catheter replaced but continued to have pain.  CT scan showed findings of proximal small bowel dilatation.  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  Is also an abrupt change in the caliber of the proximal transverse colon.  Follow-up CT scan was recommended at time. Home Medications Prior to Admission medications   Medication Sig Start Date End Date Taking? Authorizing Provider  cefdinir (OMNICEF) 300 MG capsule Take 1 capsule (300 mg total) by mouth 2 (two) times daily for 7 days. 11/19/21 11/26/21 Yes Dorie Rank,  MD  acetaminophen (TYLENOL) 500 MG tablet Take 500 mg by mouth every 4 (four) hours as needed (pain).    [provider]  albuterol (VENTOLIN HFA) 108 (90 Base) MCG/ACT inhaler Inhale 3 puffs into the lungs 3 (three) times daily as needed for wheezing or shortness of breath. 04/25/15   [provider]  amitriptyline (ELAVIL) 50 MG tablet Take 50 mg by mouth at bedtime. 03/13/19   [provider]  amLODipine (NORVASC) 10 MG tablet Take 10 mg by mouth at bedtime. 05/02/20   [provider]  ascorbic Acid (VITAMIN C) 500 MG CPCR Take 500 mg by mouth daily. Patient not taking: Reported on 10/24/2021    [provider]  aspirin 81 MG EC tablet Take 81 mg by mouth daily. 04/25/15   [provider]  AUBAGIO 14 MG TABS Take 14 mg by mouth daily. Patient not taking: Reported on 10/24/2021 05/17/20   [provider]  doxycycline (VIBRA-TABS) 100 MG tablet Take 1 tablet (100 mg total) by mouth every 12 (twelve) hours. 10/27/21   Geradine Girt, DO  Ensure Plus (ENSURE PLUS) LIQD Take 237 mLs by mouth 3 (three) times daily between meals.    [provider]  EPINEPHrine 0.3 mg/0.3 mL IJ SOAJ injection Inject 0.3 mg into the muscle once as needed for anaphylaxis.    [provider]  ergocalciferol (VITAMIN D2) 1.25 MG (50000 UT) capsule Take 50,000 Units by mouth once a week.    [provider]  losartan-hydrochlorothiazide (HYZAAR) 50-12.5 MG tablet Take 1 tablet by mouth at bedtime. 03/05/20   [provider]  magic mouthwash SOLN Take 5 mLs by mouth 3 (three) times daily. 10/27/21   Joseph Art, DO  magic mouthwash SOLN Take 5 mLs by mouth 3 (three) times daily. 10/30/21   Shahmehdi, Gemma Payor, MD  metoprolol succinate (TOPROL-XL) 100 MG 24 hr tablet Take 100 mg by mouth daily. 03/05/20   [provider]  Omega-3 Fatty Acids (FISH OIL) 1000 MG CAPS Take 1,000 mg by mouth daily.    [provider]   OXcarbazepine (TRILEPTAL) 150 MG tablet Take 150 mg by mouth in the morning and at bedtime. 04/08/20   [provider]      Allergies    Ace inhibitors, Penicillins, Amoxicillin, and Sulfa antibiotics    Review of Systems   Review of Systems  Gastrointestinal:  Positive for abdominal pain.    Physical Exam Updated Vital Signs BP (!) 165/78   Pulse 81   Temp 97.9 F (36.6 C) (Oral)   Resp 16   Ht 1.6 m (5\' 3" )   Wt 53 kg   SpO2 98%   BMI 20.69 kg/m  Physical Exam Vitals and nursing note reviewed.  Constitutional:      Appearance: She is well-developed.     Comments: Appears uncomfortable  HENT:     Head: Normocephalic and atraumatic.     Right Ear: External ear normal.     Left Ear: External ear normal.  Eyes:     General: No scleral icterus.       Right eye: No discharge.        Left eye: No discharge.     Conjunctiva/sclera: Conjunctivae normal.  Neck:     Trachea: No tracheal deviation.  Cardiovascular:     Rate and Rhythm: Normal rate.  Pulmonary:     Effort: Pulmonary effort is normal. No respiratory distress.     Breath sounds: No stridor.  Abdominal:     General: There is distension.     Tenderness: There is abdominal tenderness.     Comments: Ttp suprapubic region, distended  Genitourinary:    Comments: Foley catheter in place, limited urine in the bag Musculoskeletal:        General: No swelling or deformity.     Cervical back: Neck supple.     Right lower leg: No edema.     Left lower leg: No edema.  Skin:    General: Skin is warm and dry.     Findings: No rash.  Neurological:     General: No focal deficit present.     Mental Status: She is alert.     Cranial Nerves: Cranial nerve deficit: no gross deficits.     ED Results / Procedures / Treatments   Labs (all labs ordered are listed, but only abnormal results are displayed) Labs Reviewed  URINALYSIS, ROUTINE W REFLEX MICROSCOPIC - Abnormal; Notable for the following components:       Result Value   APPearance CLOUDY (*)    Hgb urine dipstick LARGE (*)    Protein, ur 30 (*)    Leukocytes,Ua MODERATE (*)    All other components within normal limits  CBC - Abnormal; Notable for the following components:   MCH 35.1 (*)    All other components within normal limits  COMPREHENSIVE METABOLIC PANEL - Abnormal; Notable for the following components:   Sodium 126 (*)  Chloride 91 (*)    Glucose, Bld 120 (*)    Total Protein 8.2 (*)    All other components within normal limits  URINALYSIS, MICROSCOPIC (REFLEX) - Abnormal; Notable for the following components:   Bacteria, UA MANY (*)    All other components within normal limits  URINE CULTURE  LIPASE, BLOOD    EKG None  Radiology No results found.  Procedures Procedures    Medications Ordered in ED Medications - No data to display  ED Course/ Medical Decision Making/ A&P Clinical Course as of 11/19/21 0847  Wed Nov 19, 2021  0732 Foley catheter replaced.  Patient had greater than 1000 cc of urine.  Feeling much better now.  Catheter is draining properly [JK]  0830 Comprehensive metabolic panel(!) Metabolic panel shows hyponatremia, new compared to last [JK]  0830 Urinalysis, Microscopic (reflex)(!) Urinalysis suggestive of infection [JK]  0830 CBC(!) Normal [JK]  0832 Urine culture result reviewed from last month [JK]    Clinical Course User Index [JK] Linwood Dibbles, MD                           Medical Decision Making Problems Addressed: Hyponatremia: acute illness or injury Lower urinary tract infectious disease: acute illness or injury Urinary retention: acute illness or injury  Amount and/or Complexity of Data Reviewed External Data Reviewed: labs and notes.    Details: Previous discharge summary and urine culture reviewed. Labs: ordered. Decision-making details documented in ED Course.  Risk Prescription drug management.   Patient presented to the ER for evaluation of abdominal pain.   History of recent urinary retention with a indwelling Foley catheter.  Patient had tenderness in her lower abdomen.  Foley catheter was removed and a new 1 was inserted.  Foley catheter was obstructed and went in Was placed greater than 1000 cc drain.  Patient felt significantly better after treatment.  She is not having any pain or discomfort now.  Doubt other etiology for abdominal pain at this time.  Suspect it was related to the urinary retention.  Urinalysis does show signs of infection.  We will restart her on antibiotics.  Did review urine culture previously and will treat with antibiotics to account for that culture and sensitivity.  Hyponatremia is new.  Patient is not having any other symptoms.  Possible the urinary retention contributed to her hyponatremia.  We will have her increase salt intake slightly.  Do not feel she requires admission to the hospital this time for that as she was asymptomatic.  Discussed the importance of following up on this level with her primary doctor next week.        Final Clinical Impression(s) / ED Diagnoses Final diagnoses:  Lower urinary tract infectious disease  Urinary retention  Hyponatremia    Rx / DC Orders ED Discharge Orders          Ordered    cefdinir (OMNICEF) 300 MG capsule  2 times daily        11/19/21 0844              Linwood Dibbles, MD 11/19/21 [id number]

## 2021-11-19 NOTE — Discharge Instructions (Signed)
Follow-up with your primary care doctor to recheck on your sodium level.  You can increase your salt intake slightly over the next few days.  Take the antibiotics as prescribed.  Follow-up with the urologist as planned.  Return to the ER for worsening symptoms.

## 2021-11-23 LAB — URINE CULTURE: Culture: >=100000 — AB

## 2021-11-25 ENCOUNTER — Telehealth (HOSPITAL_BASED_OUTPATIENT_CLINIC_OR_DEPARTMENT_OTHER): Payer: Self-pay | Admitting: *Deleted

## 2021-11-25 NOTE — Telephone Encounter (Signed)
Post ED Visit - Positive Culture Follow-up  Culture report reviewed by antimicrobial stewardship pharmacist: [place] Team []  Nathan Batchelder, Pharm.D. []  [place], Pharm.D., BCPS AQ-ID []  Heide Guile, Pharm.D., BCPS []  [place], Pharm.D., BCPS []  [place], Pharm.D., BCPS, AAHIVP []  [place], Pharm.D., BCPS, AAHIVP []  Legrand Como, PharmD, BCPS []  Salome Arnt, PharmD, BCPS []  Johnnette Gourd, PharmD, BCPS []  Hughes Better, PharmD []  Leeroy Cha, PharmD, BCPS []  Laqueta Linden, PharmD  [place] Team []  [address], PharmD []  Leodis Sias, PharmD []  Lindell Spar, PharmD []  Royetta Asal, Rph []  Graylin Shiver) Rema Fendt, PharmD []  Glennon Mac, PharmD []  Arlyn Dunning, PharmD []  Netta Cedars, PharmD []  Dia Sitter, PharmD []  Leone Haven, PharmD []  Gretta Arab, PharmD []  Theodis Shove, PharmD []  Peggyann Juba, PharmD   Positive urine culture Treated with Cefdinir, organism sensitive to the same and no further patient follow-up is required at this time. Reuel Boom, PharmD  Sandford Craze Talley 11/25/2021, 9:58 AM

## 2021-12-20 ENCOUNTER — Encounter (HOSPITAL_COMMUNITY): Payer: Self-pay

## 2021-12-20 ENCOUNTER — Encounter (HOSPITAL_BASED_OUTPATIENT_CLINIC_OR_DEPARTMENT_OTHER): Payer: Self-pay | Admitting: Emergency Medicine

## 2021-12-20 ENCOUNTER — Inpatient Hospital Stay (HOSPITAL_BASED_OUTPATIENT_CLINIC_OR_DEPARTMENT_OTHER)
Admission: EM | Admit: 2021-12-20 | Discharge: 2021-12-23 | DRG: 439 | Disposition: A | Discharge status: Home / Self Care | Payer: Medicare HMO | Attending: Internal Medicine | Admitting: Internal Medicine

## 2021-12-20 ENCOUNTER — Other Ambulatory Visit: Payer: Self-pay

## 2021-12-20 ENCOUNTER — Emergency Department (HOSPITAL_BASED_OUTPATIENT_CLINIC_OR_DEPARTMENT_OTHER): Payer: Medicare HMO

## 2021-12-20 DIAGNOSIS — Z8673 Personal history of transient ischemic attack (TIA), and cerebral infarction without residual deficits: Secondary | ICD-10-CM

## 2021-12-20 DIAGNOSIS — Z720 Tobacco use: Secondary | ICD-10-CM | POA: Diagnosis present

## 2021-12-20 DIAGNOSIS — E871 Hypo-osmolality and hyponatremia: Secondary | ICD-10-CM | POA: Diagnosis present

## 2021-12-20 DIAGNOSIS — G500 Trigeminal neuralgia: Secondary | ICD-10-CM | POA: Diagnosis present

## 2021-12-20 DIAGNOSIS — I10 Essential (primary) hypertension: Secondary | ICD-10-CM | POA: Diagnosis present

## 2021-12-20 DIAGNOSIS — E876 Hypokalemia: Secondary | ICD-10-CM | POA: Diagnosis present

## 2021-12-20 DIAGNOSIS — Z7982 Long term (current) use of aspirin: Secondary | ICD-10-CM

## 2021-12-20 DIAGNOSIS — F1021 Alcohol dependence, in remission: Secondary | ICD-10-CM | POA: Diagnosis present

## 2021-12-20 DIAGNOSIS — J449 Chronic obstructive pulmonary disease, unspecified: Secondary | ICD-10-CM | POA: Diagnosis present

## 2021-12-20 DIAGNOSIS — E440 Moderate protein-calorie malnutrition: Secondary | ICD-10-CM | POA: Insufficient documentation

## 2021-12-20 DIAGNOSIS — K859 Acute pancreatitis without necrosis or infection, unspecified: Principal | ICD-10-CM | POA: Diagnosis present

## 2021-12-20 DIAGNOSIS — K5900 Constipation, unspecified: Secondary | ICD-10-CM | POA: Diagnosis present

## 2021-12-20 DIAGNOSIS — Z79899 Other long term (current) drug therapy: Secondary | ICD-10-CM

## 2021-12-20 DIAGNOSIS — Z716 Tobacco abuse counseling: Secondary | ICD-10-CM

## 2021-12-20 DIAGNOSIS — Z882 Allergy status to sulfonamides status: Secondary | ICD-10-CM

## 2021-12-20 DIAGNOSIS — I700 Atherosclerosis of aorta: Secondary | ICD-10-CM | POA: Diagnosis present

## 2021-12-20 DIAGNOSIS — L299 Pruritus, unspecified: Secondary | ICD-10-CM | POA: Diagnosis not present

## 2021-12-20 DIAGNOSIS — G35 Multiple sclerosis: Secondary | ICD-10-CM | POA: Diagnosis present

## 2021-12-20 DIAGNOSIS — K8020 Calculus of gallbladder without cholecystitis without obstruction: Secondary | ICD-10-CM | POA: Diagnosis present

## 2021-12-20 DIAGNOSIS — M810 Age-related osteoporosis without current pathological fracture: Secondary | ICD-10-CM | POA: Diagnosis present

## 2021-12-20 DIAGNOSIS — Z881 Allergy status to other antibiotic agents status: Secondary | ICD-10-CM

## 2021-12-20 DIAGNOSIS — Z6820 Body mass index (BMI) 20.0-20.9, adult: Secondary | ICD-10-CM

## 2021-12-20 DIAGNOSIS — Z880 Allergy status to penicillin: Secondary | ICD-10-CM

## 2021-12-20 DIAGNOSIS — Z888 Allergy status to other drugs, medicaments and biological substances status: Secondary | ICD-10-CM

## 2021-12-20 DIAGNOSIS — F17210 Nicotine dependence, cigarettes, uncomplicated: Secondary | ICD-10-CM | POA: Diagnosis present

## 2021-12-20 DIAGNOSIS — N319 Neuromuscular dysfunction of bladder, unspecified: Secondary | ICD-10-CM | POA: Diagnosis present

## 2021-12-20 LAB — LIPASE, BLOOD: Lipase: 397 U/L — ABNORMAL HIGH (ref 11–51)

## 2021-12-20 LAB — COMPREHENSIVE METABOLIC PANEL
ALT: 13 U/L (ref 0–44)
AST: 16 U/L (ref 15–41)
Albumin: 4.3 g/dL (ref 3.5–5.0)
Alkaline Phosphatase: 86 U/L (ref 38–126)
Anion gap: 12 (ref 5–15)
BUN: 7 mg/dL — ABNORMAL LOW (ref 8–23)
CO2: 23 mmol/L (ref 22–32)
Calcium: 9.8 mg/dL (ref 8.9–10.3)
Chloride: 90 mmol/L — ABNORMAL LOW (ref 98–111)
Creatinine, Ser: 0.65 mg/dL (ref 0.44–1.00)
GFR, Estimated: >60 mL/min (ref >60)
Glucose, Bld: 100 mg/dL — ABNORMAL HIGH (ref 70–99)
Potassium: 3.9 mmol/L (ref 3.5–5.1)
Sodium: 125 mmol/L — ABNORMAL LOW (ref 135–145)
Total Bilirubin: 0.7 mg/dL (ref 0.3–1.2)
Total Protein: 8.5 g/dL — ABNORMAL HIGH (ref 6.5–8.1)

## 2021-12-20 LAB — CBC
HCT: 42.4 % (ref 36.0–46.0)
Hemoglobin: 15.2 g/dL — ABNORMAL HIGH (ref 12.0–15.0)
MCH: 34 pg (ref 26.0–34.0)
MCHC: 35.8 g/dL (ref 30.0–36.0)
MCV: 94.9 fL (ref 80.0–100.0)
Platelets: 320 10*3/uL (ref 150–400)
RBC: 4.47 MIL/uL (ref 3.87–5.11)
RDW: 13.1 % (ref 11.5–15.5)
WBC: 7.5 10*3/uL (ref 4.0–10.5)
nRBC: 0 % (ref 0.0–0.2)

## 2021-12-20 LAB — URINALYSIS, ROUTINE W REFLEX MICROSCOPIC
Bilirubin Urine: NEGATIVE
Glucose, UA: NEGATIVE mg/dL
Ketones, ur: 40 mg/dL — AB
Nitrite: NEGATIVE
Protein, ur: 100 mg/dL — AB
Specific Gravity, Urine: 1.025 (ref 1.005–1.030)
pH: 5.5 (ref 5.0–8.0)

## 2021-12-20 LAB — URINALYSIS, MICROSCOPIC (REFLEX)

## 2021-12-20 MED ORDER — SODIUM CHLORIDE 0.9 % IV BOLUS
1000.0000 mL | Freq: Once | INTRAVENOUS | Status: AC
  Administered 2021-12-20: 1000 mL via INTRAVENOUS

## 2021-12-20 MED ORDER — ONDANSETRON HCL 4 MG/2ML IJ SOLN
4.0000 mg | Freq: Once | INTRAMUSCULAR | Status: AC
  Administered 2021-12-20: 4 mg via INTRAVENOUS
  Filled 2021-12-20: qty 2

## 2021-12-20 MED ORDER — IOHEXOL 300 MG/ML  SOLN
100.0000 mL | Freq: Once | INTRAMUSCULAR | Status: AC | PRN
  Administered 2021-12-20: 100 mL via INTRAVENOUS

## 2021-12-20 MED ORDER — MORPHINE SULFATE (PF) 4 MG/ML IV SOLN
4.0000 mg | Freq: Once | INTRAVENOUS | Status: AC
  Administered 2021-12-20: 4 mg via INTRAVENOUS
  Filled 2021-12-20: qty 1

## 2021-12-20 MED ORDER — FENTANYL CITRATE PF 50 MCG/ML IJ SOSY
50.0000 ug | PREFILLED_SYRINGE | Freq: Once | INTRAMUSCULAR | Status: AC
  Administered 2021-12-20: 50 ug via INTRAVENOUS
  Filled 2021-12-20: qty 1

## 2021-12-20 NOTE — ED Notes (Signed)
Cather was removed by provider

## 2021-12-20 NOTE — ED Notes (Signed)
Patient reports abdominal pain since yesterday.Abdominal is tender to touch .Abdomen soft

## 2021-12-20 NOTE — ED Triage Notes (Signed)
Had urinary catheter exchanged yesterday. Pt began to have emesis and mid-abd pain afterwards. No diarrhea. Also says L ear is stopped up.

## 2021-12-20 NOTE — Plan of Care (Signed)
[facility] will assume care on arrival to accepting facility. Until arrival, care as per EDP. However, [facility] available 24/7 for questions and assistance.  Nursing staff, please page [facility] Admits and Consults ([phone]) as soon as the patient arrives to the hospital.   

## 2021-12-20 NOTE — ED Notes (Signed)
Unsuccessful with lab draw.  PA made aware.

## 2021-12-20 NOTE — ED Provider Notes (Signed)
[facility] DEPART[facility] Provider Note   CSN: [id number] Arrival date & time: 12/20/21  1421     History {Add pertinent medical, surgical, social history, OB history to HPI:1} Chief Complaint  Patient presents with   Abdominal Pain    Shawna Smith is a 66 y.o. female.  With a past medical history of MS who presents emergency department with chief complaint of suprapubic abdominal pain.  Patient states that she has had a catheter in place for neurogenic bladder.  She had a voiding trial done yesterday at her urology office but was unable to pass urine after about 3 hours so returned and had a 16 [place] Foley catheter placed she states that she had immediate pain upon the insertion and just "feels like it was not put in right."  She has had urine output in the bag but has had suprapubic abdominal pain nausea and vomiting since that time.  She feels like her lower abdomen is distended.  She has also had some constipation.  She denies fevers, chills, flank pain,   Abdominal Pain      Home Medications Prior to Admission medications   Medication Sig Start Date End Date Taking? Authorizing Provider  acetaminophen (TYLENOL) 500 MG tablet Take 500 mg by mouth every 4 (four) hours as needed (pain).    [provider]  albuterol (VENTOLIN HFA) 108 (90 Base) MCG/ACT inhaler Inhale 3 puffs into the lungs 3 (three) times daily as needed for wheezing or shortness of breath. 04/25/15   [provider]  amitriptyline (ELAVIL) 50 MG tablet Take 50 mg by mouth at bedtime. 03/13/19   [provider]  amLODipine (NORVASC) 10 MG tablet Take 10 mg by mouth at bedtime. 05/02/20   [provider]  ascorbic Acid (VITAMIN C) 500 MG CPCR Take 500 mg by mouth daily. Patient not taking: Reported on 10/24/2021    [provider]  aspirin 81 MG EC tablet Take 81 mg by mouth daily. 04/25/15   [provider]  AUBAGIO 14 MG TABS Take 14 mg by mouth  daily. Patient not taking: Reported on 10/24/2021 05/17/20   [provider]  doxycycline (VIBRA-TABS) 100 MG tablet Take 1 tablet (100 mg total) by mouth every 12 (twelve) hours. 10/27/21   Geradine Girt, DO  Ensure Plus (ENSURE PLUS) LIQD Take 237 mLs by mouth 3 (three) times daily between meals.    [provider]  EPINEPHrine 0.3 mg/0.3 mL IJ SOAJ injection Inject 0.3 mg into the muscle once as needed for anaphylaxis.    [provider]  ergocalciferol (VITAMIN D2) 1.25 MG (50000 UT) capsule Take 50,000 Units by mouth once a week.    [provider]  losartan-hydrochlorothiazide (HYZAAR) 50-12.5 MG tablet Take 1 tablet by mouth at bedtime. 03/05/20   [provider]  magic mouthwash SOLN Take 5 mLs by mouth 3 (three) times daily. 10/27/21   Geradine Girt, DO  magic mouthwash SOLN Take 5 mLs by mouth 3 (three) times daily. 10/30/21   Shahmehdi, Valeria Batman, MD  metoprolol succinate (TOPROL-XL) 100 MG 24 hr tablet Take 100 mg by mouth daily. 03/05/20   [provider]  Omega-3 Fatty Acids (FISH OIL) 1000 MG CAPS Take 1,000 mg by mouth daily.    [provider]  OXcarbazepine (TRILEPTAL) 150 MG tablet Take 150 mg by mouth in the morning and at bedtime. 04/08/20   [provider]      Allergies  Ace inhibitors, Penicillins, Amoxicillin, and Sulfa antibiotics    Review of Systems   Review of Systems  Gastrointestinal:  Positive for abdominal pain.    Physical Exam Updated Vital Signs BP (!) 182/82   Pulse 84   Temp 98.1 F (36.7 C) (Oral)   Resp 16   SpO2 94%  Physical Exam Vitals and nursing note reviewed.  Constitutional:      General: She is not in acute distress.    Appearance: She is well-developed. She is not diaphoretic.  HENT:     Head: Normocephalic and atraumatic.     Right Ear: External ear normal.     Left Ear: External ear normal.     Nose: Nose normal.     Mouth/Throat:     Mouth: Mucous membranes are  moist.  Eyes:     General: No scleral icterus.    Conjunctiva/sclera: Conjunctivae normal.  Cardiovascular:     Rate and Rhythm: Normal rate and regular rhythm.     Heart sounds: Normal heart sounds. No murmur heard.    No friction rub. No gallop.  Pulmonary:     Effort: Pulmonary effort is normal. No respiratory distress.     Breath sounds: Normal breath sounds.  Abdominal:     General: Bowel sounds are normal. There is no distension.     Palpations: Abdomen is soft. There is no mass.     Tenderness: There is abdominal tenderness in the suprapubic area. There is no guarding.  Musculoskeletal:     Cervical back: Normal range of motion.  Skin:    General: Skin is warm and dry.  Neurological:     Mental Status: She is alert and oriented to person, place, and time.  Psychiatric:        Behavior: Behavior normal.     ED Results / Procedures / Treatments   Labs (all labs ordered are listed, but only abnormal results are displayed) Labs Reviewed  LIPASE, BLOOD  COMPREHENSIVE METABOLIC PANEL  CBC  URINALYSIS, ROUTINE W REFLEX MICROSCOPIC    EKG None  Radiology No results found.  Procedures BLADDER CATHETERIZATION  Date/Time: 12/20/2021 7:32 PM  Performed by: Arthor Captain, PA-C Authorized by: Arthor Captain, PA-C   Consent:    Consent obtained:  Verbal   Risks discussed:  False passage, infection and incomplete procedure Universal protocol:    Patient identity confirmed:  Verbally with patient Pre-procedure details:    Procedure purpose:  Diagnostic Procedure details:    Provider performed due to:  Complicated insertion   Catheter insertion:  Indwelling   Catheter type:  Foley   Catheter size:  14 Fr   Bladder irrigation: no     Number of attempts:  1   Urine characteristics:  Cloudy Post-procedure details:    Procedure completion:  Tolerated well, no immediate complications   {Document cardiac monitor, telemetry assessment procedure when  appropriate:1}  Medications Ordered in ED Medications  fentaNYL (SUBLIMAZE) injection 50 mcg (50 mcg Intravenous Given 12/20/21 1912)    ED Course/ Medical Decision Making/ A&P                           Medical Decision Making Amount and/or Complexity of Data Reviewed Labs: ordered.  Risk Prescription drug management.   ***  {Document critical care time when appropriate:1} {Document review of labs and clinical decision tools ie heart score, Chads2Vasc2 etc:1}  {Document your independent review of radiology images, and any outside  records:1} {Document your discussion with family members, caretakers, and with consultants:1} {Document social determinants of health affecting pt's care:1} {Document your decision making why or why not admission, treatments were needed:1} Final Clinical Impression(s) / ED Diagnoses Final diagnoses:  None    Rx / DC Orders ED Discharge Orders     None

## 2021-12-20 NOTE — ED Notes (Signed)
Provider PA Harris placed foley catheter prior to this RN's shift.

## 2021-12-21 ENCOUNTER — Encounter (HOSPITAL_COMMUNITY): Payer: Self-pay | Admitting: Internal Medicine

## 2021-12-21 ENCOUNTER — Inpatient Hospital Stay (HOSPITAL_COMMUNITY): Payer: Medicare HMO

## 2021-12-21 DIAGNOSIS — E871 Hypo-osmolality and hyponatremia: Secondary | ICD-10-CM | POA: Diagnosis present

## 2021-12-21 DIAGNOSIS — Z888 Allergy status to other drugs, medicaments and biological substances status: Secondary | ICD-10-CM | POA: Diagnosis not present

## 2021-12-21 DIAGNOSIS — K852 Alcohol induced acute pancreatitis without necrosis or infection: Secondary | ICD-10-CM | POA: Diagnosis not present

## 2021-12-21 DIAGNOSIS — G500 Trigeminal neuralgia: Secondary | ICD-10-CM | POA: Diagnosis present

## 2021-12-21 DIAGNOSIS — I10 Essential (primary) hypertension: Secondary | ICD-10-CM | POA: Diagnosis present

## 2021-12-21 DIAGNOSIS — Z716 Tobacco abuse counseling: Secondary | ICD-10-CM | POA: Diagnosis not present

## 2021-12-21 DIAGNOSIS — K5900 Constipation, unspecified: Secondary | ICD-10-CM | POA: Diagnosis present

## 2021-12-21 DIAGNOSIS — L299 Pruritus, unspecified: Secondary | ICD-10-CM | POA: Diagnosis not present

## 2021-12-21 DIAGNOSIS — K859 Acute pancreatitis without necrosis or infection, unspecified: Secondary | ICD-10-CM | POA: Diagnosis present

## 2021-12-21 DIAGNOSIS — Z7982 Long term (current) use of aspirin: Secondary | ICD-10-CM | POA: Diagnosis not present

## 2021-12-21 DIAGNOSIS — Z880 Allergy status to penicillin: Secondary | ICD-10-CM | POA: Diagnosis not present

## 2021-12-21 DIAGNOSIS — F17210 Nicotine dependence, cigarettes, uncomplicated: Secondary | ICD-10-CM | POA: Diagnosis present

## 2021-12-21 DIAGNOSIS — M810 Age-related osteoporosis without current pathological fracture: Secondary | ICD-10-CM | POA: Diagnosis present

## 2021-12-21 DIAGNOSIS — F1021 Alcohol dependence, in remission: Secondary | ICD-10-CM | POA: Diagnosis present

## 2021-12-21 DIAGNOSIS — Z882 Allergy status to sulfonamides status: Secondary | ICD-10-CM | POA: Diagnosis not present

## 2021-12-21 DIAGNOSIS — E440 Moderate protein-calorie malnutrition: Secondary | ICD-10-CM | POA: Diagnosis present

## 2021-12-21 DIAGNOSIS — Z881 Allergy status to other antibiotic agents status: Secondary | ICD-10-CM | POA: Diagnosis not present

## 2021-12-21 DIAGNOSIS — K8020 Calculus of gallbladder without cholecystitis without obstruction: Secondary | ICD-10-CM | POA: Diagnosis present

## 2021-12-21 DIAGNOSIS — I700 Atherosclerosis of aorta: Secondary | ICD-10-CM | POA: Diagnosis present

## 2021-12-21 DIAGNOSIS — Z8673 Personal history of transient ischemic attack (TIA), and cerebral infarction without residual deficits: Secondary | ICD-10-CM | POA: Diagnosis not present

## 2021-12-21 DIAGNOSIS — N319 Neuromuscular dysfunction of bladder, unspecified: Secondary | ICD-10-CM | POA: Diagnosis present

## 2021-12-21 DIAGNOSIS — Z79899 Other long term (current) drug therapy: Secondary | ICD-10-CM | POA: Diagnosis not present

## 2021-12-21 DIAGNOSIS — E876 Hypokalemia: Secondary | ICD-10-CM | POA: Diagnosis present

## 2021-12-21 DIAGNOSIS — J449 Chronic obstructive pulmonary disease, unspecified: Secondary | ICD-10-CM | POA: Diagnosis present

## 2021-12-21 DIAGNOSIS — G35 Multiple sclerosis: Secondary | ICD-10-CM | POA: Diagnosis present

## 2021-12-21 LAB — COMPREHENSIVE METABOLIC PANEL
ALT: 12 U/L (ref 0–44)
AST: 23 U/L (ref 15–41)
Albumin: 3.9 g/dL (ref 3.5–5.0)
Alkaline Phosphatase: 79 U/L (ref 38–126)
Anion gap: 12 (ref 5–15)
BUN: 8 mg/dL (ref 8–23)
CO2: 22 mmol/L (ref 22–32)
Calcium: 9.5 mg/dL (ref 8.9–10.3)
Chloride: 96 mmol/L — ABNORMAL LOW (ref 98–111)
Creatinine, Ser: 0.69 mg/dL (ref 0.44–1.00)
GFR, Estimated: >60 mL/min (ref >60)
Glucose, Bld: 85 mg/dL (ref 70–99)
Potassium: 3.3 mmol/L — ABNORMAL LOW (ref 3.5–5.1)
Sodium: 130 mmol/L — ABNORMAL LOW (ref 135–145)
Total Bilirubin: 1.1 mg/dL (ref 0.3–1.2)
Total Protein: 7.7 g/dL (ref 6.5–8.1)

## 2021-12-21 LAB — CBC
HCT: 41.5 % (ref 36.0–46.0)
Hemoglobin: 14.1 g/dL (ref 12.0–15.0)
MCH: 33.3 pg (ref 26.0–34.0)
MCHC: 34 g/dL (ref 30.0–36.0)
MCV: 98.1 fL (ref 80.0–100.0)
Platelets: 348 10*3/uL (ref 150–400)
RBC: 4.23 MIL/uL (ref 3.87–5.11)
RDW: 13.4 % (ref 11.5–15.5)
WBC: 6.4 10*3/uL (ref 4.0–10.5)
nRBC: 0 % (ref 0.0–0.2)

## 2021-12-21 LAB — LIPASE, BLOOD: Lipase: 156 U/L — ABNORMAL HIGH (ref 11–51)

## 2021-12-21 LAB — MAGNESIUM: Magnesium: 1.6 mg/dL — ABNORMAL LOW (ref 1.7–2.4)

## 2021-12-21 LAB — PHOSPHORUS: Phosphorus: 4.1 mg/dL (ref 2.5–4.6)

## 2021-12-21 MED ORDER — LACTATED RINGERS IV BOLUS
1000.0000 mL | Freq: Once | INTRAVENOUS | Status: AC
  Administered 2021-12-21: 1000 mL via INTRAVENOUS

## 2021-12-21 MED ORDER — SODIUM CHLORIDE 0.9 % IV SOLN: INTRAVENOUS | Status: DC

## 2021-12-21 MED ORDER — PANTOPRAZOLE SODIUM 40 MG IV SOLR
40.0000 mg | Freq: Every day | INTRAVENOUS | Status: DC
  Administered 2021-12-21 – 2021-12-23 (×3): 40 mg via INTRAVENOUS
  Filled 2021-12-21 (×3): qty 10

## 2021-12-21 MED ORDER — PROCHLORPERAZINE EDISYLATE 10 MG/2ML IJ SOLN: 5.0000 mg | INTRAMUSCULAR | Status: DC | PRN

## 2021-12-21 MED ORDER — POTASSIUM CHLORIDE 10 MEQ/100ML IV SOLN
10.0000 meq | INTRAVENOUS | Status: AC
  Administered 2021-12-21 (×3): 10 meq via INTRAVENOUS
  Filled 2021-12-21 (×3): qty 100

## 2021-12-21 MED ORDER — ACETAMINOPHEN 325 MG PO TABS
650.0000 mg | ORAL_TABLET | Freq: Four times a day (QID) | ORAL | Status: DC | PRN
  Administered 2021-12-21: 650 mg via ORAL
  Filled 2021-12-21: qty 2

## 2021-12-21 MED ORDER — POTASSIUM CHLORIDE IN NACL 20-0.9 MEQ/L-% IV SOLN
INTRAVENOUS | Status: AC
  Filled 2021-12-21: qty 1000

## 2021-12-21 MED ORDER — ACETAMINOPHEN 650 MG RE SUPP: 650.0000 mg | Freq: Four times a day (QID) | RECTAL | Status: DC | PRN

## 2021-12-21 MED ORDER — MORPHINE SULFATE (PF) 4 MG/ML IV SOLN
4.0000 mg | INTRAVENOUS | Status: DC | PRN
  Administered 2021-12-21 – 2021-12-23 (×8): 4 mg via INTRAVENOUS
  Filled 2021-12-21 (×8): qty 1

## 2021-12-21 NOTE — H&P (Signed)
History and Physical    Patient: Shawna Smith [id number]:[id number] DOB: 1956-01-29 DOA: 12/20/2021 DOS: the patient was seen and examined on 12/21/2021 PCP: Curt Bears, MD  Patient coming from: Home  Chief Complaint:  Chief Complaint  Patient presents with   Abdominal Pain   HPI: Shawna Smith is a 66 y.o. female with medical history significant of hypertension, multiple sclerosis, osteoporosis, alcoholism in remission, chronic airway obstruction, chronic ischemia left PCA stroke neurogenic bladder, tobacco use, history of prolonged QT interval to the emergency department with abdominal pain associated with 3 episodes of emesis after she overate spaghetti with meatballs on Friday.  She denied fever, chills, rhinorrhea, sore throat, wheezing or hemoptysis.  No diarrhea, constipation, melena or hematochezia.  No flank pain, dysuria, frequency or hematuria. No chest pain, palpitations, diaphoresis, PND, orthopnea or pitting edema of the lower extremities.   No polyuria, polydipsia, polyphagia or blurred vision.   ED course: Initial vital signs were temperature 98.2 F, pulse 90, respirations 16, BP 160/93 mmHg O2 sat 97% on room air.  The patient received fentanyl 50 mcg IVP, morphine 4 mg IVP, ondansetron 4 mg IVP and 1000 mL of normal saline bolus.  Lab work: Her urinalysis was cloudy with trace hemoglobinuria, ketonuria 40 and proteinuria 100 mg/deciliter.  Leukocyte esterase was positive.  There were few bacteria with 21-50 WBC.  CBC showed a white count of 7.5, hemoglobin of 15.2 g/dL and platelets were 465.  CMP showed a sodium of 125 and chloride 90 mmol/L.  Glucose 100, BUN 7 and creatinine 0.65 mg deciliter.  Total protein was 8.5 g deciliter.  The rest of the CMP measurements were normal.  Lipase was 397.  Imaging: CT abdomen/pelvis showed cholelithiasis with distended gallbladder with pericholecystic edema which might be due to acute cholecystitis in the appropriate clinical setting.  There is  also radiographic evidence of chronic over acute pancreatitis.  There is aortic atherosclerosis.  Please see images and full radiology report for further details.   Review of Systems: As mentioned in the history of present illness. All other systems reviewed and are negative. Past Medical History:  Diagnosis Date   Hypertension    MS (multiple sclerosis) (HCC)    Osteoporosis    Past Surgical History:  Procedure Laterality Date   OPEN REDUCTION INTERNAL FIXATION (ORIF) METACARPAL Left 06/18/2018   Procedure: OPEN REDUCTION INTERNAL FIXATION (ORIF) LEFT HAND, MUTIPLE LEFT METACARPAL FRACTURES, AND OPEN REDUCTION OF MCP JOINT OF LEFT INDEX FINGER;  Surgeon: Dominica Severin, MD;  Location: [facility] OR;  Service: Orthopedics;  Laterality: Left;   Social History:  reports that she has been smoking cigarettes. She has been smoking an average of .5 packs per day. She has never used smokeless tobacco. She reports current alcohol use. She reports that she does not use drugs.  Allergies  Allergen Reactions   Ace Inhibitors Swelling and Other (See Comments)    Site of swelling not noted   Penicillins Shortness Of Breath and Swelling   Amoxicillin Swelling   Sulfa Antibiotics Itching    History reviewed. No pertinent family history.  Prior to Admission medications   Medication Sig Start Date End Date Taking? Authorizing Provider  acetaminophen (TYLENOL) 500 MG tablet Take 500 mg by mouth every 4 (four) hours as needed (pain).    [provider]  albuterol (VENTOLIN HFA) 108 (90 Base) MCG/ACT inhaler Inhale 3 puffs into the lungs 3 (three) times daily as needed for wheezing or shortness of breath. 04/25/15   [provider]  amitriptyline (ELAVIL) 50 MG tablet Take 50 mg by mouth at bedtime. 03/13/19   [provider]  amLODipine (NORVASC) 10 MG tablet Take 10 mg by mouth at bedtime. 05/02/20   [provider]  ascorbic Acid (VITAMIN C) 500 MG CPCR Take 500 mg by mouth  daily. Patient not taking: Reported on 10/24/2021    [provider]  aspirin 81 MG EC tablet Take 81 mg by mouth daily. 04/25/15   [provider]  AUBAGIO 14 MG TABS Take 14 mg by mouth daily. Patient not taking: Reported on 10/24/2021 05/17/20   [provider]  doxycycline (VIBRA-TABS) 100 MG tablet Take 1 tablet (100 mg total) by mouth every 12 (twelve) hours. 10/27/21   Geradine Girt, DO  Ensure Plus (ENSURE PLUS) LIQD Take 237 mLs by mouth 3 (three) times daily between meals.    [provider]  EPINEPHrine 0.3 mg/0.3 mL IJ SOAJ injection Inject 0.3 mg into the muscle once as needed for anaphylaxis.    [provider]  ergocalciferol (VITAMIN D2) 1.25 MG (50000 UT) capsule Take 50,000 Units by mouth once a week.    [provider]  losartan-hydrochlorothiazide (HYZAAR) 50-12.5 MG tablet Take 1 tablet by mouth at bedtime. 03/05/20   [provider]  magic mouthwash SOLN Take 5 mLs by mouth 3 (three) times daily. 10/27/21   Geradine Girt, DO  magic mouthwash SOLN Take 5 mLs by mouth 3 (three) times daily. 10/30/21   Shahmehdi, Valeria Batman, MD  metoprolol succinate (TOPROL-XL) 100 MG 24 hr tablet Take 100 mg by mouth daily. 03/05/20   [provider]  Omega-3 Fatty Acids (FISH OIL) 1000 MG CAPS Take 1,000 mg by mouth daily.    [provider]  OXcarbazepine (TRILEPTAL) 150 MG tablet Take 150 mg by mouth in the morning and at bedtime. 04/08/20   [provider]    Physical Exam: Vitals:   12/21/21 0732 12/21/21 0830 12/21/21 0900 12/21/21 0957  BP: (!) 118/98 128/77 (!) 155/82 (!) 152/85  Pulse: 88 85 87 87  Resp: 18 15 14 18   Temp: 98.1 F (36.7 C)   98.5 F (36.9 C)  TempSrc: Oral   Oral  SpO2: 95% 93% 94% 100%  Height:    5' 3.5" (1.613 m)   Physical Exam Vitals and nursing note reviewed.  Constitutional:      Appearance: She is well-developed.  HENT:     Head: Normocephalic.     Nose: No  rhinorrhea.     Mouth/Throat:     Mouth: Mucous membranes are dry.  Eyes:     General: No scleral icterus.    Pupils: Pupils are equal, round, and reactive to light.  Neck:     Vascular: No JVD.  Cardiovascular:     Rate and Rhythm: Normal rate and regular rhythm.     Heart sounds: S1 normal and S2 normal.  Pulmonary:     Effort: Pulmonary effort is normal.     Breath sounds: Normal breath sounds.  Abdominal:     General: Bowel sounds are increased.     Palpations: Abdomen is soft.     Tenderness: There is abdominal tenderness in the epigastric area. There is no right CVA tenderness, left CVA tenderness, guarding or rebound.  Musculoskeletal:     Cervical back: Neck supple.     Right lower leg: No edema.     Left lower leg: No edema.  Skin:  General: Skin is warm and dry.  Neurological:     General: No focal deficit present.     Mental Status: She is alert and oriented to person, place, and time.  Psychiatric:        Mood and Affect: Mood normal.   Data Reviewed:  There are no new results to review at this time.  Vent. rate 80 BPM PR interval 125 ms QRS duration 87 ms QT/QTcB 401/463 ms P-R-T axes 68 83 90 Sinus rhythm Probable left atrial enlargement Borderline right axis deviation  Assessment and Plan: 68 principal Problem:   Pancreatitis Observation/MedSurg. Continue IV fluids. Keep n.p.o. for now. Analgesics as needed. Antiemetics as needed. Pantoprazole 40 mg IVP daily. Follow CBC, CMP and lipase in AM.  Active Problems:   Hyponatremia Secondary to GI losses and oxcarbazepine use. Continue normal saline infusion. Follow-up sodium level.    Hypokalemia Replacing. Check magnesium and phosphorus level.    Tobacco use Smoking cessation advised. Declined nicotine replacement therapy.    Hypertension, benign Currently NPO. Parenteral antihypertensives as needed. Resume antihypertensives once cleared for oral intake.    Trigeminal neuralgia No  significant headache now. Patient is off Trileptal while NPO.    Multiple sclerosis (HCC) Supportive care. Follow-up with neurology as an outpatient.    Advance Care Planning:   Code Status: Full code.    Consults:   Family Communication:   Severity of Illness: The appropriate patient status for this patient is INPATIENT. Inpatient status is judged to be reasonable and necessary in order to provide the required intensity of service to ensure the patient's safety. The patient's presenting symptoms, physical exam findings, and initial radiographic and laboratory data in the context of their chronic comorbidities is felt to place them at high risk for further clinical deterioration. Furthermore, it is not anticipated that the patient will be medically stable for discharge from the hospital within 2 midnights of admission.   * I certify that at the point of admission it is my clinical judgment that the patient will require inpatient hospital care spanning beyond 2 midnights from the point of admission due to high intensity of service, high risk for further deterioration and high frequency of surveillance required.*  Author: Bobette Mo, MD 12/21/2021 10:24 AM  For on call review [email or web address].   This document was prepared using Dragon voice recognition software and may contain some unintended transcription errors.

## 2021-12-21 NOTE — ED Notes (Signed)
[facility] at bedside. Gave report to [facility]

## 2021-12-21 NOTE — ED Notes (Signed)
Pt catheter was placed by Abby, PA on 12/20/2021. Order was not put in, so this RN put in order so RN could chart placement

## 2021-12-21 NOTE — ED Notes (Signed)
Foley cather was inserted on 12/20/21 around [place] by provider Alfredia Client PA

## 2021-12-22 DIAGNOSIS — K852 Alcohol induced acute pancreatitis without necrosis or infection: Secondary | ICD-10-CM | POA: Diagnosis not present

## 2021-12-22 LAB — CBC
HCT: 34.1 % — ABNORMAL LOW (ref 36.0–46.0)
Hemoglobin: 12.2 g/dL (ref 12.0–15.0)
MCH: 35.2 pg — ABNORMAL HIGH (ref 26.0–34.0)
MCHC: 35.8 g/dL (ref 30.0–36.0)
MCV: 98.3 fL (ref 80.0–100.0)
Platelets: 299 10*3/uL (ref 150–400)
RBC: 3.47 MIL/uL — ABNORMAL LOW (ref 3.87–5.11)
RDW: 13.3 % (ref 11.5–15.5)
WBC: 7.3 10*3/uL (ref 4.0–10.5)
nRBC: 0 % (ref 0.0–0.2)

## 2021-12-22 LAB — GLUCOSE, CAPILLARY: Glucose-Capillary: 67 mg/dL — ABNORMAL LOW (ref 70–99)

## 2021-12-22 LAB — COMPREHENSIVE METABOLIC PANEL
ALT: 10 U/L (ref 0–44)
AST: 13 U/L — ABNORMAL LOW (ref 15–41)
Albumin: 3.4 g/dL — ABNORMAL LOW (ref 3.5–5.0)
Alkaline Phosphatase: 66 U/L (ref 38–126)
Anion gap: 8 (ref 5–15)
BUN: 8 mg/dL (ref 8–23)
CO2: 21 mmol/L — ABNORMAL LOW (ref 22–32)
Calcium: 9.3 mg/dL (ref 8.9–10.3)
Chloride: 105 mmol/L (ref 98–111)
Creatinine, Ser: 0.67 mg/dL (ref 0.44–1.00)
GFR, Estimated: >60 mL/min (ref >60)
Glucose, Bld: 67 mg/dL — ABNORMAL LOW (ref 70–99)
Potassium: 3.6 mmol/L (ref 3.5–5.1)
Sodium: 134 mmol/L — ABNORMAL LOW (ref 135–145)
Total Bilirubin: 0.7 mg/dL (ref 0.3–1.2)
Total Protein: 6.5 g/dL (ref 6.5–8.1)

## 2021-12-22 LAB — LIPID PANEL
Cholesterol: 170 mg/dL (ref 0–200)
HDL: 37 mg/dL — ABNORMAL LOW (ref >40)
LDL Cholesterol: 122 mg/dL — ABNORMAL HIGH (ref 0–99)
Total CHOL/HDL Ratio: 4.6 RATIO
Triglycerides: 53 mg/dL (ref <150)
VLDL: 11 mg/dL (ref 0–40)

## 2021-12-22 LAB — LIPASE, BLOOD: Lipase: 205 U/L — ABNORMAL HIGH (ref 11–51)

## 2021-12-22 MED ORDER — HYDROXYZINE HCL 25 MG PO TABS
25.0000 mg | ORAL_TABLET | Freq: Three times a day (TID) | ORAL | Status: DC | PRN
  Administered 2021-12-22: 25 mg via ORAL
  Filled 2021-12-22: qty 1

## 2021-12-22 MED ORDER — NYSTATIN 100000 UNIT/ML MT SUSP
5.0000 mL | Freq: Four times a day (QID) | OROMUCOSAL | Status: AC
  Administered 2021-12-22 (×3): 500000 [IU] via ORAL
  Filled 2021-12-22 (×4): qty 5

## 2021-12-22 MED ORDER — SODIUM CHLORIDE 0.9 % IV BOLUS
1000.0000 mL | Freq: Once | INTRAVENOUS | Status: AC
  Administered 2021-12-22: 1000 mL via INTRAVENOUS

## 2021-12-22 MED ORDER — CHLORHEXIDINE GLUCONATE CLOTH 2 % EX PADS
6.0000 | MEDICATED_PAD | Freq: Every day | CUTANEOUS | Status: DC
  Administered 2021-12-22 – 2021-12-23 (×2): 6 via TOPICAL

## 2021-12-22 MED ORDER — CAMPHOR-MENTHOL 0.5-0.5 % EX LOTN
TOPICAL_LOTION | CUTANEOUS | Status: DC | PRN
  Filled 2021-12-22: qty 222

## 2021-12-22 NOTE — Progress Notes (Addendum)
PROGRESS NOTE    Hollin Crewe  TGG:[id number]  DOB: 11/05/1955  DOA: 12/20/2021 PCP: Karl Luke, MD Outpatient Specialists:   Hospital course:  66 year old female with HTN, MS, history of previous EtOH use with history of previous pancreatitis was admitted yesterday with recurrent pancreatitis.   Subjective:  Patient states she does feel much better today, decreased abdominal pain.  Patient states she was surprised because she had only had 1 drink the day before her pancreatitis had recurred.  Notes that she generally does not drink much at all.   Objective: Vitals:   12/21/21 1405 12/21/21 1712 12/21/21 2211 12/22/21 0445  BP: (!) 172/87 (!) 139/91 (!) 133/96 (!) 152/115  Pulse: (!) 101 100 (!) 106 (!) 106  Resp: 18 18 20 16   Temp: 97.7 F (36.5 C) 98.2 F (36.8 C) 98.1 F (36.7 C) 98.1 F (36.7 C)  TempSrc: Oral Oral Oral Oral  SpO2: 93% 95% 93% 100%  Height:        Intake/Output Summary (Last 24 hours) at 12/22/2021 1248 Last data filed at 12/22/2021 0933 Gross per 24 hour  Intake 5.1 ml  Output 175 ml  Net -169.9 ml   There were no vitals filed for this visit.   Exam:  General: Thin female sitting up in bed in good spirits in NAD Eyes: sclera anicteric, conjuctiva mild injection bilaterally CVS: S1-S2, regular  Respiratory:  decreased air entry bilaterally secondary to decreased inspiratory effort, rales at bases  GI: NABS, minimal tenderness to palpation in her epigastric region, soft, LE: No edema.  Neuro: A/O x 3, Moving all extremities equally with normal strength, CN 3-12 intact, grossly nonfocal.  Psych: patient is logical and coherent, judgement and insight appear normal, mood and affect appropriate to situation.   Assessment & Plan:   Pancreatitis Continue aggressive fluid resuscitation Trial of clear liquids this morning Analgesics and and antiemetics are ordered and appear effective Patient states she is aware that she is unable to  tolerate any more alcohol, and is planning on no longer drinking in the future.  Pruritus Trial of Atarax and/or Sarna lotion  Hyponatremia Improving with fluid resuscitation  Hypokalemia Normalized with repletion  HTN Await formal medicine reconciliation prior to restarting her BP meds  MS Continue present outpatient management   DVT prophylaxis: SCD Code Status: Full Family Communication: None Disposition Plan:   Patient is from: Home  Anticipated Discharge Location: Home  Barriers to Discharge: Pancreatitis  Is patient medically stable for Discharge: Not yet   Scheduled Meds:  Chlorhexidine Gluconate Cloth  6 each Topical Daily   nystatin  5 mL Oral QID   pantoprazole (PROTONIX) IV  40 mg Intravenous Daily   Continuous Infusions:  sodium chloride 125 mL/hr at 12/22/21 0932    Data Reviewed:  Basic Metabolic Panel: Recent Labs  Lab 12/20/21 1945 12/21/21 1140 12/22/21 0459  NA 125* 130* 134*  K 3.9 3.3* 3.6  CL 90* 96* 105  CO2 23 22 21*  GLUCOSE 100* 85 67*  BUN 7* 8 8  CREATININE 0.65 0.69 0.67  CALCIUM 9.8 9.5 9.3  MG  --  1.6*  --   PHOS  --  4.1  --     CBC: Recent Labs  Lab 12/20/21 1945 12/21/21 1140 12/22/21 0459  WBC 7.5 6.4 7.3  HGB 15.2* 14.1 12.2  HCT 42.4 41.5 34.1*  MCV 94.9 98.1 98.3  PLT 320 348 299    Studies: US Abdomen Limited RUQ (LIVER/GB)  Result  Date: 12/21/2021 CLINICAL DATA:  RIGHT upper quadrant pain for 3 days. EXAM: ULTRASOUND ABDOMEN LIMITED RIGHT UPPER QUADRANT COMPARISON:  CT abdomen dated 12/20/2021. FINDINGS: Gallbladder: 6 mm gallstone. No gallbladder wall thickening or pericholecystic fluid. No sonographic Murphy's sign elicited. Common bile duct: Diameter: 5 mm Liver: No focal lesion identified. Within normal limits in parenchymal echogenicity. Portal vein is patent on color Doppler imaging with normal direction of blood flow towards the liver. Other: None. IMPRESSION: 1. Cholelithiasis without evidence of  acute cholecystitis. 2. Otherwise unremarkable RIGHT upper quadrant ultrasound. No acute findings. Electronically Signed   By: Bary Richard M.D.   On: 12/21/2021 12:18   CT ABDOMEN PELVIS W CONTRAST  Result Date: 12/20/2021 CLINICAL DATA:  Epigastric pain, vomiting, and mid abdominal pain after urinary catheter exchange yesterday. EXAM: CT ABDOMEN AND PELVIS WITH CONTRAST TECHNIQUE: Multidetector CT imaging of the abdomen and pelvis was performed using the standard protocol following bolus administration of intravenous contrast. RADIATION DOSE REDUCTION: This exam was performed according to the departmental dose-optimization program which includes automated exposure control, adjustment of the mA and/or kV according to patient size and/or use of iterative reconstruction technique. CONTRAST:  OMNIPAQUE IOHEXOL 300 MG/ML  SOLN COMPARISON:  10/22/2021 FINDINGS: Lower chest: Atelectasis in the lung bases.  Cardiac enlargement. Hepatobiliary: No focal liver lesions. Cholelithiasis with small stone in the dependent gallbladder. Gallbladder is distended with pericholecystic edema. Changes could represent acute cholecystitis in the appropriate clinical setting. No bile duct dilatation. Pancreas: Calcification in the head of the pancreas consistent with chronic pancreatitis. There is diffuse pancreatic ductal dilatation. Low-attenuation changes in the head of the pancreas. Mild infiltration around the pancreas with loss of fat planes. Changes could reflect superimposed acute on chronic pancreatitis. Pancreatic ductal dilatation and pancreatic calcifications are similar to previous study. An underlying occult pancreatic mass lesion is not entirely excluded although none was seen on prior MRI abdomen 06/22/2021. Spleen: Normal in size without focal abnormality. Adrenals/Urinary Tract: Adrenal glands are unremarkable. Kidneys are normal, without renal calculi, focal lesion, or hydronephrosis. Bladder is decompressed  with a Foley catheter in place. Stomach/Bowel: Stomach, small bowel, and colon are not abnormally distended. No wall thickening or inflammatory changes are appreciated. Appendix is normal. Vascular/Lymphatic: Aortic atherosclerosis. No enlarged abdominal or pelvic lymph nodes. Reproductive: Uterus and bilateral adnexa are unremarkable. Other: No abdominal wall hernia or abnormality. No abdominopelvic ascites. Musculoskeletal: Degenerative changes.  No acute bony abnormalities. IMPRESSION: 1. Cholelithiasis. Distended gallbladder with pericholecystic edema. Changes may represent acute cholecystitis in the appropriate clinical setting. 2. Calcification in the head of the pancreas consistent with chronic pancreatitis. Diffuse pancreatic ductal dilatation. Low-attenuation changes in the head of the pancreas with mild infiltration around the pancreas with loss of fat planes. Changes likely to represent acute pancreatitis superimposed upon chronic pancreatitis. Underlying mass lesion is not excluded and follow-up after resolution of acute process is suggested. 3. Foley catheter decompresses the bladder. 4. Aortic atherosclerosis. Electronically Signed   By: Burman Nieves M.D.   On: 12/20/2021 22:39    Principal Problem:   Pancreatitis Active Problems:   Multiple sclerosis (HCC)   Hypokalemia   Tobacco use   Hypertension, benign   Hyponatremia   Trigeminal neuralgia     Pieter Partridge, [facility]  If 7PM-7AM, please contact night-coverage [email or web address]   LOS: 1 day

## 2021-12-22 NOTE — Progress Notes (Addendum)
Initial Nutrition Assessment  DOCUMENTATION CODES:   Non-severe (moderate) malnutrition in context of chronic illness  INTERVENTION:   Once diet advanced: -Ensure MAX Protein po BID, each supplement provides 150 kcal and 30 grams of protein  -Multivitamin with minerals daily -B complex w/ Vitamin C daily  NUTRITION DIAGNOSIS:   Moderate Malnutrition related to chronic illness (MS) as evidenced by mild fat depletion, severe muscle depletion.  GOAL:   Patient will meet greater than or equal to 90% of their needs  MONITOR:   PO intake, Supplement acceptance, Labs, Weight trends, I & O's, Diet advancement  REASON FOR ASSESSMENT:   Malnutrition Screening Tool    ASSESSMENT:   66 y.o. female with medical history significant of hypertension, multiple sclerosis, osteoporosis, alcoholism in remission, chronic airway obstruction, chronic ischemia left PCA stroke neurogenic bladder, tobacco use, history of prolonged QT interval to the emergency department with abdominal pain associated with 3 episodes of emesis after she overate spaghetti with meatballs on Friday.  Admitted for acute pancreatitis.  Patient reports not eating anything since Friday, she had spaghetti. States she has had no appetite for "forever". Suspects this is d/t her MS. Pt is encouraged to eat by her sister. Pt will eat because she knows she has to. Has drank Ensure as well.  Pt takes potassium and vitamin D weekly. Pt's abdominal pain began around 10/20. States this started after her catheter was exchanged. She was unable to keep water down.  Pt currently NPO. Will await plan for diet advancement.   Per weight records, no weight recorded for admission.  Pt states 2 weeks ago she weighed ~112 lbs at a doctor's appointment. On bed scale during visit, pt weighed 125 lbs.  Medications reviewed.  Labs reviewed: CBGs: 74 Low Na   NUTRITION - FOCUSED PHYSICAL EXAM:  Flowsheet Row Most Recent Value  Orbital  Region Mild depletion  Upper Arm Region Moderate depletion  Thoracic and Lumbar Region No depletion  Buccal Region Mild depletion  Temple Region Mild depletion  Clavicle Bone Region Mild depletion  Clavicle and Acromion Bone Region Mild depletion  Scapular Bone Region Mild depletion  Dorsal Hand Severe depletion  Patellar Region Severe depletion  Anterior Thigh Region Severe depletion  Posterior Calf Region Severe depletion  Edema (RD Assessment) None  Hair Reviewed  Eyes Reviewed  [bloodshot]  Mouth Reviewed  Skin Reviewed  [dry]  Nails Reviewed       Diet Order:   Diet Order             Diet NPO time specified  Diet effective now                   EDUCATION NEEDS:   No education needs have been identified at this time  Skin:  Skin Assessment: Reviewed RN Assessment  Last BM:  10/20  Height:   Ht Readings from Last 1 Encounters:  12/21/21 5' 3.5" (1.613 m)    Weight:   Wt Readings from Last 1 Encounters:  11/19/21 53 kg    BMI:  Body mass index is 20.37 kg/m.  Estimated Nutritional Needs:   Kcal:  1350-1550  Protein:  65-75g  Fluid:  1.5L/day   Clayton Bibles, MS, RD, LDN Inpatient Clinical Dietitian Contact information available via Amion

## 2021-12-23 DIAGNOSIS — I10 Essential (primary) hypertension: Secondary | ICD-10-CM | POA: Diagnosis not present

## 2021-12-23 DIAGNOSIS — E440 Moderate protein-calorie malnutrition: Secondary | ICD-10-CM | POA: Diagnosis not present

## 2021-12-23 DIAGNOSIS — K852 Alcohol induced acute pancreatitis without necrosis or infection: Secondary | ICD-10-CM | POA: Diagnosis not present

## 2021-12-23 LAB — LIPASE, BLOOD: Lipase: 163 U/L — ABNORMAL HIGH (ref 11–51)

## 2021-12-23 MED ORDER — EPINEPHRINE 0.3 MG/0.3ML IJ SOAJ: 0.3000 mg | Freq: Once | INTRAMUSCULAR | 2 refills | Status: AC | PRN

## 2021-12-23 MED ORDER — ADULT MULTIVITAMIN W/MINERALS CH
1.0000 | ORAL_TABLET | Freq: Every day | ORAL | Status: DC
  Administered 2021-12-23: 1 via ORAL
  Filled 2021-12-23: qty 1

## 2021-12-23 MED ORDER — ENSURE MAX PROTEIN PO LIQD
11.0000 [oz_av] | Freq: Two times a day (BID) | ORAL | Status: DC
  Filled 2021-12-23 (×2): qty 330

## 2021-12-23 MED ORDER — B COMPLEX-C PO TABS: 1.0000 | ORAL_TABLET | Freq: Every day | ORAL | Status: DC

## 2021-12-23 MED ORDER — POLYVINYL ALCOHOL 1.4 % OP SOLN
1.0000 [drp] | OPHTHALMIC | Status: DC | PRN
  Administered 2021-12-23: 1 [drp] via OPHTHALMIC
  Filled 2021-12-23: qty 15

## 2021-12-23 MED ORDER — HYDROXYZINE HCL 10 MG PO TABS: 10.0000 mg | ORAL_TABLET | Freq: Three times a day (TID) | ORAL | 0 refills | Status: DC | PRN

## 2021-12-23 MED ORDER — LOSARTAN POTASSIUM 50 MG PO TABS
25.0000 mg | ORAL_TABLET | Freq: Every day | ORAL | Status: DC
  Administered 2021-12-23: 25 mg via ORAL
  Filled 2021-12-23: qty 1

## 2021-12-23 MED ORDER — HYDRALAZINE HCL 50 MG PO TABS
50.0000 mg | ORAL_TABLET | Freq: Three times a day (TID) | ORAL | Status: DC
  Administered 2021-12-23: 50 mg via ORAL
  Filled 2021-12-23: qty 1

## 2021-12-23 MED ORDER — ALBUTEROL SULFATE HFA 108 (90 BASE) MCG/ACT IN AERS: 2.0000 | INHALATION_SPRAY | Freq: Three times a day (TID) | RESPIRATORY_TRACT | 2 refills | Status: DC | PRN

## 2021-12-23 MED ORDER — MAGIC MOUTHWASH
5.0000 mL | Freq: Three times a day (TID) | ORAL | Status: DC
  Administered 2021-12-23: 5 mL via ORAL
  Filled 2021-12-23 (×3): qty 5

## 2021-12-23 MED ORDER — CARBAMIDE PEROXIDE 6.5 % OT SOLN
5.0000 [drp] | Freq: Two times a day (BID) | OTIC | Status: DC
  Administered 2021-12-23: 5 [drp] via OTIC
  Filled 2021-12-23: qty 15

## 2021-12-23 NOTE — Final Progress Note (Signed)
Pt discharged home with foley in place for chronic retention.  AVS and medications reviewed with pt, no questions or concerns at time of departure.  IV removed without complication.  Sent home with all belongings, transported by sister via private vehicle.

## 2021-12-23 NOTE — Discharge Summary (Signed)
Physician Discharge Summary  Shawna Smith [id number] DOB: 03/04/1955 DOA: 12/20/2021  PCP: Karl Luke, MD  Admit date: 12/20/2021 Discharge date: 12/23/2021  Admitted From: Home Disposition: Home  Recommendations for Outpatient Follow-up:  Follow up with PCP in 1-2 weeks Please obtain BMP/CBC in one week Follow-up with urology as already scheduled.  Home Health: N/A Equipment/Devices: N/A  Discharge Condition: Stable CODE STATUS: Full code Diet recommendation: Regular diet, no alcohol.  Discharge Summary: 66 year old with history of hypertension, multiple sclerosis, previous history of alcoholic pancreatitis presented with abdominal pain, vomiting after having 1 drink of alcohol.  She also has history of neurogenic bladder and currently has indwelling Foley catheter.  In the emergency room treated with IV fluids, lipase was 397.  CT scan showed cholelithiasis.  LFTs were normal.  Right upper quadrant ultrasound showed no evidence of acute cholecystitis.  Acute pancreatitis, does have evidence of cholelithiasis but happens after drinking alcohol.  Currently stable and asymptomatic.  Patient will monitor her symptoms with total abstinence, if recurrent even after total abstinence need to consider gallbladder disease and patient is aware about this.  Today she is tolerating regular diet and asymptomatic, discharged home with advice.  Chronic medical issues including hypertension, indwelling Foley catheter remained stable.  She will follow-up with urology.     Discharge Diagnoses:  Principal Problem:   Pancreatitis Active Problems:   Multiple sclerosis (HCC)   Hypokalemia   Tobacco use   Hypertension, benign   Hyponatremia   Trigeminal neuralgia   Malnutrition of moderate degree    Discharge Instructions  Discharge Instructions     Call MD for:  persistant nausea and vomiting   Complete by: As directed    Call MD for:  severe uncontrolled pain   Complete by: As  directed    Diet - low sodium heart healthy   Complete by: As directed    Discharge instructions   Complete by: As directed    Do not drink any form of alcohol   Increase activity slowly   Complete by: As directed       Allergies as of 12/23/2021       Reactions   Ace Inhibitors Itching, Swelling, Other (See Comments)   Site of swelling not noted   Lisinopril Anaphylaxis, Swelling, Other (See Comments)   Tongue swells   Penicillins Shortness Of Breath, Itching, Swelling   Amoxicillin Itching, Swelling   Tape Itching, Other (See Comments)   CAUSES EXTREME ITCHING   Sulfa Antibiotics Itching, Swelling        Medication List     STOP taking these medications    Aubagio 14 MG Tabs Generic drug: Teriflunomide   doxycycline 100 MG tablet Commonly known as: VIBRA-TABS   magic mouthwash Soln       TAKE these medications    acetaminophen 500 MG tablet Commonly known as: TYLENOL Take 500 mg by mouth every 4 (four) hours as needed (pain).   albuterol 108 (90 Base) MCG/ACT inhaler Commonly known as: VENTOLIN HFA Inhale 2 puffs into the lungs 3 (three) times daily as needed for wheezing or shortness of breath.   amitriptyline 50 MG tablet Commonly known as: ELAVIL Take 50 mg by mouth at bedtime.   amLODipine 10 MG tablet Commonly known as: NORVASC Take 10 mg by mouth at bedtime.   aspirin EC 81 MG tablet Take 81 mg by mouth daily.   Ensure Plus Liqd Take 237 mLs by mouth 3 (three) times daily between meals.   EPINEPHrine  0.3 mg/0.3 mL Soaj injection Commonly known as: EPI-PEN Inject 0.3 mg into the muscle once as needed for anaphylaxis.   ergocalciferol 1.25 MG (50000 UT) capsule Commonly known as: VITAMIN D2 Take 50,000 Units by mouth every Thursday.   Fish Oil 1000 MG Caps Take 1,000 mg by mouth daily.   hydrALAZINE 50 MG tablet Commonly known as: APRESOLINE Take 50 mg by mouth 2 (two) times daily.   losartan-hydrochlorothiazide 50-12.5 MG  tablet Commonly known as: HYZAAR Take 1 tablet by mouth daily.   metoprolol succinate 100 MG 24 hr tablet Commonly known as: TOPROL-XL Take 100 mg by mouth daily.   OXcarbazepine 150 MG tablet Commonly known as: TRILEPTAL Take 150 mg by mouth in the morning and at bedtime.   potassium chloride SA 20 MEQ tablet Commonly known as: KLOR-CON M Take 20 mEq by mouth daily.   tamsulosin 0.4 MG Caps capsule Commonly known as: FLOMAX Take 0.4 mg by mouth daily after breakfast.   traMADol 100 MG 24 hr tablet Commonly known as: ULTRAM-ER Take 100 mg by mouth in the morning.   traZODone 50 MG tablet Commonly known as: DESYREL Take 100 mg by mouth at bedtime as needed for sleep.        Allergies  Allergen Reactions   Ace Inhibitors Itching, Swelling and Other (See Comments)    Site of swelling not noted   Lisinopril Anaphylaxis, Swelling and Other (See Comments)    Tongue swells   Penicillins Shortness Of Breath, Itching and Swelling   Amoxicillin Itching and Swelling   Tape Itching and Other (See Comments)    CAUSES EXTREME ITCHING   Sulfa Antibiotics Itching and Swelling    Consultations: None   Procedures/Studies: US Abdomen Limited RUQ (LIVER/GB)  Result Date: 12/21/2021 CLINICAL DATA:  RIGHT upper quadrant pain for 3 days. EXAM: ULTRASOUND ABDOMEN LIMITED RIGHT UPPER QUADRANT COMPARISON:  CT abdomen dated 12/20/2021. FINDINGS: Gallbladder: 6 mm gallstone. No gallbladder wall thickening or pericholecystic fluid. No sonographic Murphy's sign elicited. Common bile duct: Diameter: 5 mm Liver: No focal lesion identified. Within normal limits in parenchymal echogenicity. Portal vein is patent on color Doppler imaging with normal direction of blood flow towards the liver. Other: None. IMPRESSION: 1. Cholelithiasis without evidence of acute cholecystitis. 2. Otherwise unremarkable RIGHT upper quadrant ultrasound. No acute findings. Electronically Signed   By: Franki Cabot M.D.    On: 12/21/2021 12:18   CT ABDOMEN PELVIS W CONTRAST  Result Date: 12/20/2021 CLINICAL DATA:  Epigastric pain, vomiting, and mid abdominal pain after urinary catheter exchange yesterday. EXAM: CT ABDOMEN AND PELVIS WITH CONTRAST TECHNIQUE: Multidetector CT imaging of the abdomen and pelvis was performed using the standard protocol following bolus administration of intravenous contrast. RADIATION DOSE REDUCTION: This exam was performed according to the departmental dose-optimization program which includes automated exposure control, adjustment of the mA and/or kV according to patient size and/or use of iterative reconstruction technique. CONTRAST:  13mL OMNIPAQUE IOHEXOL 300 MG/ML  SOLN COMPARISON:  10/22/2021 FINDINGS: Lower chest: Atelectasis in the lung bases.  Cardiac enlargement. Hepatobiliary: No focal liver lesions. Cholelithiasis with small stone in the dependent gallbladder. Gallbladder is distended with pericholecystic edema. Changes could represent acute cholecystitis in the appropriate clinical setting. No bile duct dilatation. Pancreas: Calcification in the head of the pancreas consistent with chronic pancreatitis. There is diffuse pancreatic ductal dilatation. Low-attenuation changes in the head of the pancreas. Mild infiltration around the pancreas with loss of fat planes. Changes could reflect superimposed acute on chronic  pancreatitis. Pancreatic ductal dilatation and pancreatic calcifications are similar to previous study. An underlying occult pancreatic mass lesion is not entirely excluded although none was seen on prior MRI abdomen 06/22/2021. Spleen: Normal in size without focal abnormality. Adrenals/Urinary Tract: Adrenal glands are unremarkable. Kidneys are normal, without renal calculi, focal lesion, or hydronephrosis. Bladder is decompressed with a Foley catheter in place. Stomach/Bowel: Stomach, small bowel, and colon are not abnormally distended. No wall thickening or inflammatory  changes are appreciated. Appendix is normal. Vascular/Lymphatic: Aortic atherosclerosis. No enlarged abdominal or pelvic lymph nodes. Reproductive: Uterus and bilateral adnexa are unremarkable. Other: No abdominal wall hernia or abnormality. No abdominopelvic ascites. Musculoskeletal: Degenerative changes.  No acute bony abnormalities. IMPRESSION: 1. Cholelithiasis. Distended gallbladder with pericholecystic edema. Changes may represent acute cholecystitis in the appropriate clinical setting. 2. Calcification in the head of the pancreas consistent with chronic pancreatitis. Diffuse pancreatic ductal dilatation. Low-attenuation changes in the head of the pancreas with mild infiltration around the pancreas with loss of fat planes. Changes likely to represent acute pancreatitis superimposed upon chronic pancreatitis. Underlying mass lesion is not excluded and follow-up after resolution of acute process is suggested. 3. Foley catheter decompresses the bladder. 4. Aortic atherosclerosis. Electronically Signed   By: Lucienne Capers M.D.   On: 12/20/2021 22:39   (Echo, Carotid, EGD, Colonoscopy, ERCP)    Subjective: Patient seen and examined.  She does have multiple somatic complaints.  When discussed about going home, she was very excited and started calling her family.  Denies any abdominal pain. -Patient was wondering whether her Foley catheter is in the wrong place, I assured her. -Patient wanted me to refer to ENT, orthopedics, urologist to take care of some chronic issues.  I defer this to her primary care physician so she can follow-up. -In the afternoon, patient tolerated lunch without nausea vomiting.  She is eager to go home.   Discharge Exam: Vitals:   12/22/21 1400 12/23/21 1249  BP: (!) 176/82 (!) 185/88  Pulse: 99 78  Resp: 18 18  Temp: 97.9 F (36.6 C) (!) 97.3 F (36.3 C)  SpO2: 97% 94%   Vitals:   12/22/21 0445 12/22/21 1400 12/23/21 0700 12/23/21 1249  BP: (!) 152/115 (!) 176/82   (!) 185/88  Pulse: (!) 106 99  78  Resp: 16 18  18   Temp: 98.1 F (36.7 C) 97.9 F (36.6 C)  (!) 97.3 F (36.3 C)  TempSrc: Oral Oral  Oral  SpO2: 100% 97%  94%  Weight:   56 kg   Height:        General: Pt is alert, awake, not in acute distress Thin and cachectic. Cardiovascular: RRR, S1/S2 +, no rubs, no gallops Respiratory: CTA bilaterally, no wheezing, no rhonchi Abdominal: Soft, NT, ND, bowel sounds + Extremities: no edema, no cyanosis    The results of significant diagnostics from this hospitalization (including imaging, microbiology, ancillary and laboratory) are listed below for reference.     Microbiology: No results found for this or any previous visit (from the past 240 hour(s)).   Labs: BNP (last 3 results) Recent Labs    10/22/21 1609  BNP 979.8*   Basic Metabolic Panel: Recent Labs  Lab 12/20/21 1945 12/21/21 1140 12/22/21 0459  NA 125* 130* 134*  K 3.9 3.3* 3.6  CL 90* 96* 105  CO2 23 22 21*  GLUCOSE 100* 85 67*  BUN 7* 8 8  CREATININE 0.65 0.69 0.67  CALCIUM 9.8 9.5 9.3  MG  --  1.6*  --   PHOS  --  4.1  --    Liver Function Tests: Recent Labs  Lab 12/20/21 1945 12/21/21 1140 12/22/21 0459  AST 16 23 13*  ALT 13 12 10   ALKPHOS 86 79 66  BILITOT 0.7 1.1 0.7  PROT 8.5* 7.7 6.5  ALBUMIN 4.3 3.9 3.4*   Recent Labs  Lab 12/20/21 1945 12/21/21 1140 12/22/21 0459 12/23/21 0527  LIPASE 397* 156* 205* 163*   No results for input(s): "AMMONIA" in the last 168 hours. CBC: Recent Labs  Lab 12/20/21 1945 12/21/21 1140 12/22/21 0459  WBC 7.5 6.4 7.3  HGB 15.2* 14.1 12.2  HCT 42.4 41.5 34.1*  MCV 94.9 98.1 98.3  PLT 320 348 299   Cardiac Enzymes: No results for input(s): "CKTOTAL", "CKMB", "CKMBINDEX", "TROPONINI" in the last 168 hours. BNP: Invalid input(s): "POCBNP" CBG: Recent Labs  Lab 12/22/21 0834  GLUCAP 67*   D-Dimer No results for input(s): "DDIMER" in the last 72 hours. Hgb A1c No results for input(s):  "HGBA1C" in the last 72 hours. Lipid Profile Recent Labs    12/22/21 0459  CHOL 170  HDL 37*  LDLCALC 122*  TRIG 53  CHOLHDL 4.6   Thyroid function studies No results for input(s): "TSH", "T4TOTAL", "T3FREE", "THYROIDAB" in the last 72 hours.  Invalid input(s): "FREET3" Anemia work up No results for input(s): "VITAMINB12", "FOLATE", "FERRITIN", "TIBC", "IRON", "RETICCTPCT" in the last 72 hours. Urinalysis    Component Value Date/Time   COLORURINE YELLOW 12/20/2021 2204   APPEARANCEUR CLOUDY (A) 12/20/2021 2204   LABSPEC 1.025 12/20/2021 2204   PHURINE 5.5 12/20/2021 2204   GLUCOSEU NEGATIVE 12/20/2021 2204   HGBUR TRACE (A) 12/20/2021 2204   BILIRUBINUR NEGATIVE 12/20/2021 2204   KETONESUR 40 (A) 12/20/2021 2204   PROTEINUR 100 (A) 12/20/2021 2204   NITRITE NEGATIVE 12/20/2021 2204   LEUKOCYTESUR SMALL (A) 12/20/2021 2204   Sepsis Labs Recent Labs  Lab 12/20/21 1945 12/21/21 1140 12/22/21 0459  WBC 7.5 6.4 7.3   Microbiology No results found for this or any previous visit (from the past 240 hour(s)).   Time coordinating discharge: 32 minutes  SIGNED:   Barb Merino, MD  [facility] 12/23/2021, 2:14 PM

## 2022-08-27 ENCOUNTER — Emergency Department (HOSPITAL_BASED_OUTPATIENT_CLINIC_OR_DEPARTMENT_OTHER)
Admission: EM | Admit: 2022-08-27 | Discharge: 2022-08-27 | Discharge status: Against Medical Advice | Payer: Medicare HMO | Attending: Emergency Medicine | Admitting: Emergency Medicine

## 2022-08-27 ENCOUNTER — Emergency Department (HOSPITAL_BASED_OUTPATIENT_CLINIC_OR_DEPARTMENT_OTHER): Payer: Medicare HMO

## 2022-08-27 ENCOUNTER — Other Ambulatory Visit: Payer: Self-pay

## 2022-08-27 ENCOUNTER — Encounter (HOSPITAL_BASED_OUTPATIENT_CLINIC_OR_DEPARTMENT_OTHER): Payer: Self-pay | Admitting: Urology

## 2022-08-27 DIAGNOSIS — Z7982 Long term (current) use of aspirin: Secondary | ICD-10-CM | POA: Diagnosis not present

## 2022-08-27 DIAGNOSIS — I10 Essential (primary) hypertension: Secondary | ICD-10-CM | POA: Diagnosis not present

## 2022-08-27 DIAGNOSIS — M79671 Pain in right foot: Secondary | ICD-10-CM | POA: Diagnosis not present

## 2022-08-27 DIAGNOSIS — Y92002 Bathroom of unspecified non-institutional (private) residence single-family (private) house as the place of occurrence of the external cause: Secondary | ICD-10-CM | POA: Diagnosis not present

## 2022-08-27 DIAGNOSIS — Z79899 Other long term (current) drug therapy: Secondary | ICD-10-CM | POA: Insufficient documentation

## 2022-08-27 DIAGNOSIS — Z5329 Procedure and treatment not carried out because of patient's decision for other reasons: Secondary | ICD-10-CM | POA: Insufficient documentation

## 2022-08-27 DIAGNOSIS — M79672 Pain in left foot: Secondary | ICD-10-CM | POA: Diagnosis present

## 2022-08-27 DIAGNOSIS — W19XXXA Unspecified fall, initial encounter: Secondary | ICD-10-CM | POA: Insufficient documentation

## 2022-08-27 DIAGNOSIS — G35 Multiple sclerosis: Secondary | ICD-10-CM | POA: Insufficient documentation

## 2022-08-27 LAB — COMPREHENSIVE METABOLIC PANEL
ALT: 9 U/L (ref 0–44)
AST: 14 U/L — ABNORMAL LOW (ref 15–41)
Albumin: 4.2 g/dL (ref 3.5–5.0)
Alkaline Phosphatase: 87 U/L (ref 38–126)
Anion gap: 13 (ref 5–15)
BUN: 27 mg/dL — ABNORMAL HIGH (ref 8–23)
CO2: 23 mmol/L (ref 22–32)
Calcium: 9.7 mg/dL (ref 8.9–10.3)
Chloride: 93 mmol/L — ABNORMAL LOW (ref 98–111)
Creatinine, Ser: 1.3 mg/dL — ABNORMAL HIGH (ref 0.44–1.00)
GFR, Estimated: 46 mL/min — ABNORMAL LOW (ref >60)
Glucose, Bld: 104 mg/dL — ABNORMAL HIGH (ref 70–99)
Potassium: 4.5 mmol/L (ref 3.5–5.1)
Sodium: 129 mmol/L — ABNORMAL LOW (ref 135–145)
Total Bilirubin: 0.9 mg/dL (ref 0.3–1.2)
Total Protein: 8.2 g/dL — ABNORMAL HIGH (ref 6.5–8.1)

## 2022-08-27 LAB — CBC
HCT: 48.7 % — ABNORMAL HIGH (ref 36.0–46.0)
Hemoglobin: 17.7 g/dL — ABNORMAL HIGH (ref 12.0–15.0)
MCH: 34.3 pg — ABNORMAL HIGH (ref 26.0–34.0)
MCHC: 36.3 g/dL — ABNORMAL HIGH (ref 30.0–36.0)
MCV: 94.4 fL (ref 80.0–100.0)
Platelets: 217 10*3/uL (ref 150–400)
RBC: 5.16 MIL/uL — ABNORMAL HIGH (ref 3.87–5.11)
RDW: 14.6 % (ref 11.5–15.5)
WBC: 7.2 10*3/uL (ref 4.0–10.5)
nRBC: 0 % (ref 0.0–0.2)

## 2022-08-27 MED ORDER — IBUPROFEN 800 MG PO TABS
800.0000 mg | ORAL_TABLET | Freq: Once | ORAL | Status: AC
  Administered 2022-08-27: 800 mg via ORAL
  Filled 2022-08-27: qty 1

## 2022-08-27 MED ORDER — ACETAMINOPHEN 500 MG PO TABS
1000.0000 mg | ORAL_TABLET | Freq: Once | ORAL | Status: AC
  Administered 2022-08-27: 1000 mg via ORAL
  Filled 2022-08-27: qty 2

## 2022-08-27 NOTE — ED Provider Notes (Cosign Needed Addendum)
[place] [facility] Provider Note   CSN: [id number] Arrival date & time: 08/27/22  1840     History  Chief Complaint  Patient presents with   Marletta Lor    Shawna Smith is a 67 y.o. female past with history of hypertension, multiple sclerosis presents today for evaluation after fall.  Patient states she got up and went to the bathroom this morning when she had a fall.  States her legs gave out.  States she has pain in both of her feet.  States she also felt dizzy when she had the fall.  Denies any chest pain, shortness of breath, fever, nausea or vomiting.  History of multiple sclerosis with chronic pain in her legs.  She denies hitting her head, LOC when she fell.   Fall      Past Medical History:  Diagnosis Date   Hypertension    MS (multiple sclerosis) (HCC)    Osteoporosis    Past Surgical History:  Procedure Laterality Date   OPEN REDUCTION INTERNAL FIXATION (ORIF) METACARPAL Left 06/18/2018   Procedure: OPEN REDUCTION INTERNAL FIXATION (ORIF) LEFT HAND, MUTIPLE LEFT METACARPAL FRACTURES, AND OPEN REDUCTION OF MCP JOINT OF LEFT INDEX FINGER;  Surgeon: Dominica Severin, MD;  Location: [facility] OR;  Service: Orthopedics;  Laterality: Left;     Home Medications Prior to Admission medications   Medication Sig Start Date End Date Taking? Authorizing Provider  acetaminophen (TYLENOL) 500 MG tablet Take 500 mg by mouth every 4 (four) hours as needed (pain).    [provider]  albuterol (VENTOLIN HFA) 108 (90 Base) MCG/ACT inhaler Inhale 2 puffs into the lungs 3 (three) times daily as needed for wheezing or shortness of breath. 12/23/21 01/22/22  Dorcas Carrow, MD  amitriptyline (ELAVIL) 50 MG tablet Take 50 mg by mouth at bedtime. 03/13/19   [provider]  amLODipine (NORVASC) 10 MG tablet Take 10 mg by mouth at bedtime. 05/02/20   [provider]  aspirin 81 MG EC tablet Take 81 mg by mouth daily. 04/25/15   [provider]  Ensure Plus (ENSURE PLUS) LIQD Take 237 mLs by mouth 3 (three) times daily between meals.    [provider]  EPINEPHrine 0.3 mg/0.3 mL IJ SOAJ injection Inject 0.3 mg into the muscle once as needed for anaphylaxis. 12/23/21   Dorcas Carrow, MD  ergocalciferol (VITAMIN D2) 1.25 MG (50000 UT) capsule Take 50,000 Units by mouth every Thursday.    [provider]  hydrALAZINE (APRESOLINE) 50 MG tablet Take 50 mg by mouth 2 (two) times daily.    [provider]  hydrOXYzine (ATARAX) 10 MG tablet Take 1 tablet (10 mg total) by mouth 3 (three) times daily as needed. 12/23/21   Dorcas Carrow, MD  losartan-hydrochlorothiazide (HYZAAR) 50-12.5 MG tablet Take 1 tablet by mouth daily. 03/05/20   [provider]  metoprolol succinate (TOPROL-XL) 100 MG 24 hr tablet Take 100 mg by mouth daily. 03/05/20   [provider]  Omega-3 Fatty Acids (FISH OIL) 1000 MG CAPS Take 1,000 mg by mouth daily.    [provider]  OXcarbazepine (TRILEPTAL) 150 MG tablet Take 150 mg by mouth in the morning and at bedtime. 04/08/20   [provider]  potassium chloride SA (KLOR-CON M) 20 MEQ tablet Take 20 mEq by mouth daily.    [provider]  tamsulosin (FLOMAX) 0.4 MG CAPS capsule Take 0.4 mg by mouth daily after breakfast.    [provider]  traMADol (ULTRAM-ER) 100 MG 24 hr tablet Take 100 mg by mouth in the morning.    [provider]  traZODone (DESYREL) 50 MG tablet Take 100 mg by mouth at bedtime as needed for sleep.    [provider]      Allergies    Ace inhibitors, Lisinopril, Penicillins, Amoxicillin, Tape, and Sulfa antibiotics    Review of Systems   Review of Systems Negative except as per HPI.  Physical Exam Updated Vital Signs BP 138/71   Pulse 91   Temp 98.5 F (36.9 C) (Oral)   Resp 18   Ht 5\' 3"  (1.6 m)   Wt 56 kg   SpO2 100%   BMI 21.87 kg/m  Physical Exam Vitals and nursing note  reviewed.  Constitutional:      Appearance: Normal appearance.  HENT:     Head: Normocephalic and atraumatic.     Mouth/Throat:     Mouth: Mucous membranes are moist.  Eyes:     General: No scleral icterus. Cardiovascular:     Rate and Rhythm: Normal rate and regular rhythm.     Pulses: Normal pulses.     Heart sounds: Normal heart sounds.  Pulmonary:     Effort: Pulmonary effort is normal.     Breath sounds: Normal breath sounds.  Abdominal:     General: Abdomen is flat.     Palpations: Abdomen is soft.     Tenderness: There is no abdominal tenderness.  Musculoskeletal:        General: No deformity.     Comments: Tenderness to palpation to dorsal aspect of both feet. No leg swelling noted. Patient is ambulatory.  Skin:    General: Skin is warm.     Findings: No rash.  Neurological:     General: No focal deficit present.     Mental Status: She is alert.     Comments: Cranial nerves II through XII intact. Intact sensation to light touch in all 4 extremities. 5/5 strength in all 4 extremities. Intact finger-to-nose and heel-to-shin of all 4 extremities. No visual field cuts. No neglect noted. No aphasia noted.   Psychiatric:        Mood and Affect: Mood normal.     ED Results / Procedures / Treatments   Labs (all labs ordered are listed, but only abnormal results are displayed) Labs Reviewed  COMPREHENSIVE METABOLIC PANEL - Abnormal; Notable for the following components:      Result Value   Sodium 129 (*)    Chloride 93 (*)    Glucose, Bld 104 (*)    BUN 27 (*)    Creatinine, Ser 1.30 (*)    Total Protein 8.2 (*)    AST 14 (*)    GFR, Estimated 46 (*)    All other components within normal limits  CBC - Abnormal; Notable for the following components:   RBC 5.16 (*)    Hemoglobin 17.7 (*)    HCT 48.7 (*)    MCH 34.3 (*)    MCHC 36.3 (*)    All other components within normal limits    EKG None  Radiology DG Foot Complete Right  Result Date:  08/27/2022 CLINICAL DATA:  Larey Seat, foot pain EXAM: RIGHT FOOT COMPLETE - 3+ VIEW COMPARISON:  None Available. FINDINGS: Frontal, oblique, and lateral views of the right foot are obtained. No acute fracture, subluxation, or dislocation. Multifocal osteoarthritis greatest at the first tarsometatarsal joint and throughout the interphalangeal joints. Bones are  osteopenic. Soft tissues are unremarkable. IMPRESSION: 1. No acute displaced fracture. 2. Osteopenia. 3. Osteoarthritis. Electronically Signed   By: Sharlet Salina M.D.   On: 08/27/2022 19:51   DG Foot Complete Left  Result Date: 08/27/2022 CLINICAL DATA:  Larey Seat last night, foot pain EXAM: LEFT FOOT - COMPLETE 3+ VIEW COMPARISON:  None Available. FINDINGS: Frontal, oblique, and lateral views of the left foot are obtained. Prior ORIF of the calcaneus. Bones are osteopenic. No acute fracture, subluxation, or dislocation. Mild interphalangeal joint space narrowing consistent with osteoarthritis. Soft tissues are unremarkable. IMPRESSION: 1. No acute fracture. 2. Osteopenia. 3. Osteoarthritis. Electronically Signed   By: Sharlet Salina M.D.   On: 08/27/2022 19:50    Procedures Procedures    Medications Ordered in ED Medications  acetaminophen (TYLENOL) tablet 1,000 mg (1,000 mg Oral Given 08/27/22 2102)  ibuprofen (ADVIL) tablet 800 mg (800 mg Oral Given 08/27/22 2102)    ED Course/ Medical Decision Making/ A&P                             Medical Decision Making Amount and/or Complexity of Data Reviewed Labs: ordered. Radiology: ordered.  Risk OTC drugs. Prescription drug management.   This patient presents to the ED for fall, this involves an extensive number of treatment options, and is a complaint that carries with a high risk of complications and morbidity.  The differential diagnosis includes fracture, dislocation. This is not an exhaustive list.  Lab tests: I ordered and personally interpreted labs.  The pertinent results  include: WBC unremarkable. Hbg unremarkable. Platelets unremarkable. Electrolytes unremarkable. BUN 27, creatinine 1.3.   Imaging studies: I ordered imaging studies. I personally reviewed, interpreted imaging and agree with the radiologist's interpretations. The results include: X-ray of left foot and right foot showed no acute osseous abnormality.  Problem list/ ED course/ Critical interventions/ Medical management: HPI: See above Vital signs within normal range and stable throughout visit. Laboratory/imaging studies significant for: See above. On physical examination, patient is afebrile and appears in no acute distress.  States her legs gave out this morning when she had a fall. She does have a history of multiple sclerosis causing pain in her legs and surgery with hardware in her left foot.  X-rays of left foot and right foot showed no acute osseous abnormalities.  States she had some dizziness when getting up to go to the bathroom.  Labs with CBC show elevated BUN and creatinine, likely secondary to dehydration.  Before CBC and CMP results came back, I was told by nursing staff that patient wants to leave before lab results. Patient educated on lab work that it would be best for her to wait for the results to ensure she is safe for discharge.  She still adamant that she wants to leave now and is not waiting for the results due to her family waiting on her at home.  Patient signed AMA form. I called and talked to the patient about the test results after she left AMA.  I advised patient to follow-up with her primary care physician for repeating labs and she can also return to the emergency department for reevaluation and management as her BUN and creatinine levels were elevated. She states she will seek help tomorrow. I have reviewed the patient home medicines and have made adjustments as needed.  Cardiac monitoring/EKG: The patient was maintained on a cardiac monitor.  I personally reviewed and  interpreted the cardiac monitor  which showed an underlying rhythm of: sinus rhythm.  Additional history obtained: External records from outside source obtained and reviewed including: Chart review including previous notes, labs, imaging.  Consultations obtained:  Disposition The patient is clinically not intoxicated, free from distracting pain, appears to have intact insight, judgment and reason and in my medical opinion has the capacity to make decisions. The patient is also not under any pressure to leave the hospital. In this scenario, it would be battery to subject a patient to treatment against his/her will. I have voiced my concerns for the patient's health given that a full evaluation and treatment had not occurred. I have discussed the need for continued evaluation to determine if their symptoms are caused by a condition that present risk of death or morbidity. Risks including but not limited to disability, prolonged hospitalization, prolonged illness, were discussed. I discussed the specific benefits of additional treatment, as well as tried offering alternative options in hopes that the patient might be amenable to partial evaluation and treatment which would be medically beneficial to the patient. However, the patient declined my options and insisted on leaving. Because I have been unable to convince the patient to stay, I answered all of their questions about their condition and asked them to return to the ED as soon as possible to complete their evaluation, especially if their symptoms worsen or do not improve. I emphasized that leaving against medical advice does not preclude returning here for further evaluation. I asked the patient to return if they change their mind about the further evaluation and treatment. I strongly encouraged the patient to return to this Emergency Department or any Emergency Department at any time, particularly with worsening symptoms.    This chart was dictated  using voice recognition software.  Despite best efforts to proofread,  errors can occur which can change the documentation meaning.          Final Clinical Impression(s) / ED Diagnoses Final diagnoses:  Fall, initial encounter  Foot pain, bilateral    Rx / DC Orders ED Discharge Orders     None         Jeanelle Malling, [place] 08/28/22 1043    Jeanelle Malling, PA 08/28/22 1045    Edwin Dada P, DO 09/02/22 1758

## 2022-08-27 NOTE — ED Notes (Signed)
Pt is requesting a wheelchair at this time because she wants to leave. Pt educated on lab work and that it would be best for her to wait for the results to ensure she is safe for discharge. Pt adamant that she wants to leave now and is not waiting for the results due to her family waiting on her. PA Le made aware and will go speak to pt. Pt also give a drink; tolerating well.

## 2022-08-27 NOTE — ED Triage Notes (Signed)
Pt states fall last night while getting up to the bathroom  States pain to bilateral feet, pain with weight bearing Denies any knee or hip pain  Denies Head injury or LOC with fall  Not on blood thinners

## 2022-08-27 NOTE — ED Notes (Signed)
Pt has told PA Le she will leave AMA. Pt will not wait for the results of her lab work. Pt given pain meds and will be brought a wheelchair to head out to lobby.

## 2023-04-05 ENCOUNTER — Encounter (HOSPITAL_BASED_OUTPATIENT_CLINIC_OR_DEPARTMENT_OTHER): Payer: Self-pay | Admitting: Emergency Medicine

## 2023-04-05 ENCOUNTER — Emergency Department (HOSPITAL_BASED_OUTPATIENT_CLINIC_OR_DEPARTMENT_OTHER)
Admission: EM | Admit: 2023-04-05 | Discharge: 2023-04-05 | Disposition: A | Discharge status: Home / Self Care | Payer: Medicare HMO | Attending: Emergency Medicine | Admitting: Emergency Medicine

## 2023-04-05 ENCOUNTER — Other Ambulatory Visit: Payer: Self-pay

## 2023-04-05 DIAGNOSIS — Z79899 Other long term (current) drug therapy: Secondary | ICD-10-CM | POA: Insufficient documentation

## 2023-04-05 DIAGNOSIS — Z7982 Long term (current) use of aspirin: Secondary | ICD-10-CM | POA: Insufficient documentation

## 2023-04-05 DIAGNOSIS — I10 Essential (primary) hypertension: Secondary | ICD-10-CM | POA: Insufficient documentation

## 2023-04-05 DIAGNOSIS — H5712 Ocular pain, left eye: Secondary | ICD-10-CM | POA: Diagnosis present

## 2023-04-05 MED ORDER — FLUORESCEIN SODIUM 1 MG OP STRP
1.0000 | ORAL_STRIP | Freq: Once | OPHTHALMIC | Status: AC
  Administered 2023-04-05: 1 via OPHTHALMIC
  Filled 2023-04-05: qty 1

## 2023-04-05 MED ORDER — OXYCODONE HCL 5 MG PO TABS: 5.0000 mg | ORAL_TABLET | ORAL | 0 refills | Status: DC | PRN

## 2023-04-05 MED ORDER — OXYCODONE HCL 5 MG PO TABS
5.0000 mg | ORAL_TABLET | Freq: Once | ORAL | Status: AC
  Administered 2023-04-05: 5 mg via ORAL
  Filled 2023-04-05: qty 1

## 2023-04-05 MED ORDER — TETRACAINE HCL 0.5 % OP SOLN
2.0000 [drp] | Freq: Once | OPHTHALMIC | Status: AC
  Administered 2023-04-05: 2 [drp] via OPHTHALMIC
  Filled 2023-04-05: qty 4

## 2023-04-05 NOTE — ED Provider Notes (Incomplete)
[name] [facility] Provider Note   CSN: [id number] Arrival date & time: 04/05/23  1718     History {Add pertinent medical, surgical, social history, OB history to HPI:1} Chief Complaint  Patient presents with   Eye Pain    Shawna Smith is a 68 y.o. female.  HPI     68yo female with history of MS< hypertension,       04/04/2023 11:13 AM EST  1. Multifocal T2 FLAIR hyperintense signal abnormality within the supratentorial and infratentorial brain, as described and unchanged from the prior MRI of 12/10/2020. Given the provided history of multiple sclerosis, these findings likely reflect a combination of chronic demyelinating disease and chronic ischemia. 2. Mild generalized parenchymal volume loss. 3. Mild paranasal sinus mucosal thickening.   Electronically Signed  By: Jackey Loge D.O.  On: 04/04/2023 11:13 Past Medical History:  Diagnosis Date   Hypertension    MS (multiple sclerosis) (HCC)    Osteoporosis     Home Medications Prior to Admission medications   Medication Sig Start Date End Date Taking? Authorizing Provider  acetaminophen (TYLENOL) 500 MG tablet Take 500 mg by mouth every 4 (four) hours as needed (pain).    [provider]  albuterol (VENTOLIN HFA) 108 (90 Base) MCG/ACT inhaler Inhale 2 puffs into the lungs 3 (three) times daily as needed for wheezing or shortness of breath. 12/23/21 01/22/22  Dorcas Carrow, MD  amitriptyline (ELAVIL) 50 MG tablet Take 50 mg by mouth at bedtime. 03/13/19   [provider]  amLODipine (NORVASC) 10 MG tablet Take 10 mg by mouth at bedtime. 05/02/20   [provider]  aspirin 81 MG EC tablet Take 81 mg by mouth daily. 04/25/15   [provider]  Ensure Plus (ENSURE PLUS) LIQD Take 237 mLs by mouth 3 (three) times daily between meals.    [provider]  EPINEPHrine 0.3 mg/0.3 mL IJ SOAJ injection Inject 0.3 mg into the muscle once as needed  for anaphylaxis. 12/23/21   Dorcas Carrow, MD  ergocalciferol (VITAMIN D2) 1.25 MG (50000 UT) capsule Take 50,000 Units by mouth every Thursday.    [provider]  hydrALAZINE (APRESOLINE) 50 MG tablet Take 50 mg by mouth 2 (two) times daily.    [provider]  hydrOXYzine (ATARAX) 10 MG tablet Take 1 tablet (10 mg total) by mouth 3 (three) times daily as needed. 12/23/21   Dorcas Carrow, MD  losartan-hydrochlorothiazide (HYZAAR) 50-12.5 MG tablet Take 1 tablet by mouth daily. 03/05/20   [provider]  metoprolol succinate (TOPROL-XL) 100 MG 24 hr tablet Take 100 mg by mouth daily. 03/05/20   [provider]  Omega-3 Fatty Acids (FISH OIL) 1000 MG CAPS Take 1,000 mg by mouth daily.    [provider]  OXcarbazepine (TRILEPTAL) 150 MG tablet Take 150 mg by mouth in the morning and at bedtime. 04/08/20   [provider]  potassium chloride SA (KLOR-CON M) 20 MEQ tablet Take 20 mEq by mouth daily.    [provider]  tamsulosin (FLOMAX) 0.4 MG CAPS capsule Take 0.4 mg by mouth daily after breakfast.    [provider]  traMADol (ULTRAM-ER) 100 MG 24 hr tablet Take 100 mg by mouth in the morning.    [provider]  traZODone (DESYREL) 50 MG tablet Take 100 mg by mouth at bedtime as needed for sleep.    [provider]      Allergies  Ace inhibitors, Lisinopril, Penicillins, Amoxicillin, Tape, and Sulfa antibiotics    Review of Systems   Review of Systems  Physical Exam Updated Vital Signs BP (!) 191/104 (BP Location: Right Arm)   Pulse 92   Temp 97.6 F (36.4 C) (Oral)   Resp 20   Wt 54.4 kg   SpO2 95%   BMI 21.26 kg/m  Physical Exam  ED Results / Procedures / Treatments   Labs (all labs ordered are listed, but only abnormal results are displayed) Labs Reviewed - No data to display  EKG None  Radiology No results found.  Procedures Procedures  {Document cardiac monitor, telemetry  assessment procedure when appropriate:1}  Medications Ordered in ED Medications - No data to display  ED Course/ Medical Decision Making/ A&P   {   Click here for ABCD2, HEART and other calculatorsREFRESH Note before signing :1}                              Medical Decision Making  ***  {Document critical care time when appropriate:1} {Document review of labs and clinical decision tools ie heart score, Chads2Vasc2 etc:1}  {Document your independent review of radiology images, and any outside records:1} {Document your discussion with family members, caretakers, and with consultants:1} {Document social determinants of health affecting pt's care:1} {Document your decision making why or why not admission, treatments were needed:1} Final Clinical Impression(s) / ED Diagnoses Final diagnoses:  None    Rx / DC Orders ED Discharge Orders     None

## 2023-04-05 NOTE — ED Triage Notes (Addendum)
Recurrent sharp Left eye pain , Hx MS .  Seen  by the neurologist multiple time for same , she said . No redness swelling or drainage .

## 2023-09-07 NOTE — Progress Notes (Signed)
 Follow Up Telehealth Note 09/08/2023  Today's visit was completed via a real-time telehealth encounter while the patient is in [place]. A telehealth visit was utilized in order to decrease the patient's potential exposure to COVID-19 vs an in-person visit. The patient/authorized person was informed of the potential benefits, limitations, and risks of telemedicine, expressed understanding that the laws that protect confidentiality also apply to telemedicine, and provided oral consent at the time of the visit to engaging in a telemedicine encounter with the present provider at [facility].   Telehealth Modality: Phone visit (audio only) Total time spent in the clinical discussion 9 minutes.  Reason for Visit  [phone]   \\\lcm PR-*jbb*09/07/23 THV DC  Assessment  1) MS. Relapsing remitting subtype. Stable radiographically and clinically.  Has not historically had very active disease presentation. Last MRI brain 03/24/2023.  Currently tolerating modified regimen of Aubagio for the sake of tolerability.  May consider phasing out DMTs with age especially for relatively inactive MS patients.  Will require continued monitoring for elevated liver enzymes, marrow suppression, PN, HTN, derm changes and interstitial lung symptoms while on medication.    2) Facial pain. Left. Unimproved. Presumptive trigeminal neuralgia (TN).  S/p GKS 08/10/23.  Patient had reintroduction of oxcarbazepine  at 150 mg BID and reports adherence at this time to which we will try titration of bedtime dose up to 300 mg next and if tolerated after 1 week, further titrate a.m. dose up to 300 mg.  Patient historically with reasonably well-controlled TN while on amitriptyline such that we may wish to introduce this once again as adjunct treatment especially in light of improvement of urinary retention issues.  Will arrange for pain clinic referral due to refractory nature of discomfort at this time.  Will again provide with  limited prescription of oxycodone .  Instructed to call with any undue effect from any treatment options.  3) Insomnia.  Reliant on trazodone .  4) Left occipital (PCA distribution) stroke.  Associated right visual field defect, right partial inferior quadrantanopia.  Stroke modifiable risk factors include: Smoking, HTN, dyslipidemia. Encouraged adherence with once daily aspirin .  Discouraged smoking.  Encouraged continued efforts at stroke modifiable risk factor optimization with PCP.  Last MRI brain 12/10/2020.  5) Urinary retention.  Some relative improvement.    Plan  1) pain consultation referral for refractory TN  2) Limited oxycodone  prescription 3) Oxcarbazepine  titration from 150 mg BID to 150 mg a.m., 300 mg p.m. for 1 week, instructed to call back with clinical update  I plan to see Shawna Smith Public back for No follow-ups on file.  She understands to call me for any questions or concerns.   ___________ L. Charlyne, MD Certifications in Neurology, Clinical Neurophysiology, Neuroimaging  HPI  Shawna Smith is a 68 y.o. BF with MS, occipital CVA, R inferior quadrantanopia, alcoholism in remission, TN.  She is accompanied by her sister today.  Patient sent for urgent neurosurgical evaluation for refractory trigeminal neuralgia and did undergo gamma knife trigeminal nerve rhizotomy on 08/10/2023 without reported complication.  Due to report of pain patient was offered resumption of oxcarbazepine  which was called into the pharmacy on 08/25/2023.  Patient was reevaluated in the ED on 08/26/2023 and was provided provisional supply of oxycodone  for which she was recommended to discontinue once pain free but reports she has not experienced any pain freedom thus far.  Patient only provided 1 oxycodone  and no refills by ED provider.  Patient reports she has not unfortunately experienced any  element of improvement of her left facial pain since the time of the GKS, and in fact states she may  have had some minor worsening Was recently reinitiated on oxcarbazepine  and is reporting adherence with 150 mg BID without complication, despite previous concerns over adherence with proper regimen Has run out of supply of oxycodone  which had offered her limited period of relief but just not sustainable When chewing hurts  Updated MRI of the brain completed 03/24/2023 showing no new interval changes in comparison to previous 2022 study.   Patient seen by ophthalmology on 01/06/2021.  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  MedHx: Tramadol , Provigil, Mobic, Cymbalta, carbamazepine, fentanyl , Toradol, amantadine, Lyrica, trazodone , amitriptyline, Darvocet, Zyprexa, indomethacin, Demerol, cyclobenzaprine, citalopram, Lexapro, ultram  ER, Depakote, nuvigil, tramadol  ER, oxycodone , oxcarbazepine   Exam  There were no vitals taken for this visit.  PM/SH   Allergies  Allergen Reactions  . Ace Inhibitors Anaphylaxis, Itching, Other (See Comments) and Swelling    Site of swelling not noted  Tongue swells  . Lisinopril Swelling  . Penicillins Itching, Other (See Comments), Shortness Of Breath and Swelling  . Sulfa (Sulfonamide Antibiotics) Itching, Other (See Comments) and Swelling   Current Outpatient Medications  Medication Sig Dispense Refill  . albuterol  HFA (PROVENTIL  HFA;VENTOLIN  HFA;PROAIR  HFA) 90 mcg/actuation inhaler Inhale 2 puffs every 6 (six) hours as needed for wheezing. 1 each 1  . amLODIPine  (NORVASC ) 10 mg tablet TAKE 1 TABLET(10 MG) BY MOUTH DAILY 90 tablet 0  . aspirin  81 mg EC tablet Take 81 mg by mouth daily.    . carbamide peroxide (DEBROX) 6.5 % otic solution Administer 5 drops into each ear 2 (two) times a day.    . cholecalciferol (VITAMIN D3) 400 unit (10 mcg) tablet Take 400 Units by mouth daily.    SABRA docosahexaenoic acid-epa (Fish OiL) 120-180 mg cap Take 1 capsule by mouth daily.     . ibuprofen  (MOTRIN ) 200 mg tablet Take 200 mg by mouth every 6 (six) hours as needed for mild pain (1-3).    SABRA ipratropium (ATROVENT) 42 mcg (0.06 %) spry nasal spray Administer 1 spray into each nostril 2 (two) times a day. 15 mL 1  . magnesium  oxide (MAG-OX) 250 mg tablet Take 250 mg by mouth daily.    . metoprolol  succinate (TOPROL  XL) 100 mg 24 hr tablet TAKE 1 TABLET(100 MG) BY MOUTH DAILY 90 tablet 0  . mirtazapine (REMERON) 7.5 mg tablet Take 7.5 mg by mouth at bedtime.    . nutritional drink (Ensure Active High Protein) liqd liquid DRINK 1 CONTAINER THREE TIMES DAILY WITH A MEAL 100 Bottle 3  . OXcarbazepine  (TRILEPTAL ) 150 mg tablet Take 1 tablet in AM, take 2 tabs in PM. 90 tablet 0  . oxyCODONE  (ROXICODONE ) 5 mg immediate release tablet Take 1 tablet (5 mg total) by mouth every 6 (six) hours as needed for severe pain (7-10). 60 tablet 0  . pantoprazole  (PROTONIX ) 40 mg EC tablet Take 1 tablet (40 mg total) by mouth every morning before breakfast. 90 tablet 1  . potassium chloride  20 mEq ER tablet Take 1 tablet (20 mEq total) by mouth daily. 90 tablet 1  . rosuvastatin (CRESTOR) 5 mg tablet TAKE 1 TABLET(5 MG) BY MOUTH DAILY 90 tablet 0  . teriflunomide 7 mg tab TAKE 1 TABLET BY MOUTH EVERY NIGHT AT BEDTIME 30 tablet 4  . traZODone  (DESYREL ) 100 mg tablet Take 100  mg by mouth at bedtime.    . varenicline tartrate (CHANTIX PAK) 0.5 mg (11)- 1 mg (42) tablet Take 0.5mg  one daily on days 1-3 and 0.5 mg twice daily on days 4-7. Then 1 mg twice daily for a total of 12 weeks. 95 tablet 0   No current facility-administered medications for this visit.   Past Medical History:  Diagnosis Date  . Acute respiratory failure with hypoxia    (CMD) 05/28/2020  . Cataract   . Chronic airway obstruction    (CMD)   . Closed displaced fracture of left calcaneus   . Closed fracture of left distal radius 07/31/2016  . Closed fracture of left shoulder   . History of prolonged Q-T interval on ECG  06/29/2023  . Hyperglyceridemia   . Hypertension   . Hypokalemia   . Malnutrition of moderate degree (CMD) 12/23/2021  . Metacarpal bone fracture 06/18/2018  . Multiple sclerosis    (CMD)    reportedly diagnosed in the mid 2000s (circa 2004)  . Optic neuritis   . Osteoporosis   . Pancreatitis (CMD) 12/20/2021  . 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 Pneumonia of right lung due to infectious organism 02/03/2023  . Prolonged QT interval 05/28/2020   Past Surgical History:  Procedure Laterality Date  . BACK SURGERY    . BREAST BIOPSY Right    20 yrs ago  . CHOLECYSTECTOMY     Family History  Problem Relation Name Age of Onset  . Heart disease Mother    . Cancer Father    . Glaucoma Sister    . Breast cancer Other Mgma   . Anesthesia problems Neg Hx    . Arthritis Neg Hx    . Asthma Neg Hx    . Cerebral palsy Neg Hx    . Clotting disorder Neg Hx    . Club foot Neg Hx    . Collagen disease Neg Hx    . Deep vein thrombosis Neg Hx    . Diabetes Neg Hx    . Gait disorder Neg Hx    . Gout Neg Hx    . Hip dysplasia Neg Hx    . Hip fracture Neg Hx    . Hypermobility Neg Hx    . Hypertension Neg Hx    . Osteoporosis Neg Hx    . Other Neg Hx    . Pulmonary disease Neg Hx    . Rheumatologic disease Neg Hx    . Scoliosis Neg Hx    . Spina bifida Neg Hx    . Stroke Neg Hx    . Thyroid disease Neg Hx    . Vasculitis Neg Hx    . Macular degeneration Neg Hx      LAB DATA   Lab Results  Component Value Date   WBC 5.90 05/05/2023   HGB 13.8 05/05/2023   HCT 41.1 05/05/2023   PLT 160 05/05/2023   CHOL 160 01/14/2023   TRIG 84 01/14/2023   HDL 59 (L) 01/14/2023   LDLDIRECT 86 10/07/2021   ALT 21 05/05/2023   AST 24 05/05/2023   NA 142 05/05/2023   K 4.0 05/05/2023   CL 108 (H) 05/05/2023   CREATININE 0.95 05/05/2023   BUN 16 05/05/2023   CO2 29 05/05/2023   TSH 1.127 04/28/2023   Admission on 05/05/2023, Discharged on 05/06/2023  Component Date Value Ref Range  Status  . Sodium 05/05/2023 142  136 - 145 mmol/L Final  . Potassium 05/05/2023 4.0  3.4 - 4.5 mmol/L Final  . Chloride 05/05/2023 108 (H)  98 - 107 mmol/L Final  . CO2 05/05/2023 29  21 - 31 mmol/L Final  . Anion Gap 05/05/2023 5 (L)  6 - 14 mmol/L Final  . Glucose, Random 05/05/2023 80  70 - 99 mg/dL Final  . Blood Urea Nitrogen (BUN) 05/05/2023 16  7 - 25 mg/dL Final  . Creatinine 96/94/7974 0.95  0.60 - 1.20 mg/dL Final  . eGFR 96/94/7974 66  >59 mL/min/1.100m2 Final   GFR estimated by CKD-EPI equations(NKF 2021).   Recommend confirmation of Cr-based eGFR by using Cys-based eGFR and other filtration markers (if applicable) in complex cases and clinical decision-making, as needed.  . Albumin 05/05/2023 4.0  3.5 - 5.7 g/dL Final  . Total Protein 05/05/2023 6.4  6.4 - 8.9 g/dL Final  . Bilirubin, Total 05/05/2023 0.3  0.3 - 1.0 mg/dL Final  . Alkaline Phosphatase (ALP) 05/05/2023 75  34 - 104 U/L Final  . Aspartate Aminotransferase (AST) 05/05/2023 24  13 - 39 U/L Final  . Alanine Aminotransferase (ALT) 05/05/2023 21  7 - 52 U/L Final  . Calcium 05/05/2023 9.7  8.6 - 10.3 mg/dL Final  . BUN/Creatinine Ratio 05/05/2023    Final   Creatinine is normal, ratio is not clinically indicated.   . Color, Urine 05/06/2023 Yellow  Yellow Final  . Clarity, Urine 05/06/2023 Cloudy (A)  Clear Final  . Specific Gravity, Urine 05/06/2023 1.022  1.005 - 1.025 Final  . pH, Urine 05/06/2023 7.0  5.0 - 8.0 Final  . Protein, Urine 05/06/2023 Negative  Negative, 10 , 20  mg/dL Final  . Glucose, Urine 05/06/2023 Negative  Negative, 30 , 50  mg/dL Final  . Ketones, Urine 05/06/2023 Negative  Negative, Trace mg/dL Final  . Bilirubin, Urine 05/06/2023 Negative  Negative Final  . Blood, Urine 05/06/2023 Negative  Negative, Trace Final  . Nitrite, Urine 05/06/2023 Negative  Negative Final  . Leukocyte Esterase, Urine 05/06/2023 250 (A)  Negative, 25 Final  . Urobilinogen, Urine 05/06/2023 Normal  <2.0 mg/dL  Final  . WBC, Urine 96/93/7974 13-20 (A)  <6 /HPF Final  . RBC, Urine 05/06/2023 0-2  0 - 2 /HPF Final  . Bacteria, Urine 05/06/2023 Rare  None Seen, Rare /HPF Final  . Squamous Epithelial Cells, Urine 05/06/2023 11-20 (A)  0 - 5 /HPF Final  . Hyaline Casts, Urine 05/06/2023 0-2  0 - 2 /LPF Final  . WBC 05/05/2023 5.90  4.40 - 11.00 10*3/uL Final  . RBC 05/05/2023 4.08 (L)  4.10 - 5.10 10*6/uL Final  . Hemoglobin 05/05/2023 13.8  12.3 - 15.3 g/dL Final  . Hematocrit 96/94/7974 41.1  35.9 - 44.6 % Final  . Mean Corpuscular Volume (MCV) 05/05/2023 100.7 (H)  80.0 - 96.0 fL Final  . Mean Corpuscular Hemoglobin (MCH) 05/05/2023 33.9 (H)  27.5 - 33.2 pg Final  . Mean Corpuscular Hemoglobin Conc (* 05/05/2023 33.7  33.0 - 37.0 g/dL Final  . Red Cell Distribution Width (RDW) 05/05/2023 16.7  12.3 - 17.0 % Final  . Platelet Count (PLT) 05/05/2023 160  150 - 450 10*3/uL Final  . Mean Platelet Volume (MPV) 05/05/2023 9.3  6.8 - 10.2 fL Final  . Neutrophils % 05/05/2023 54  % Final  . Lymphocytes % 05/05/2023 28  % Final  . Monocytes % 05/05/2023 17  % Final  . Eosinophils % 05/05/2023 2  % Final  . Basophils % 05/05/2023 1  % Final  . Neutrophils  Absolute 05/05/2023 3.20  1.80 - 7.80 10*3/uL Final  . Lymphocytes # 05/05/2023 1.60  1.00 - 4.80 10*3/uL Final  . Monocytes # 05/05/2023 1.00 (H)  0.00 - 0.80 10*3/uL Final  . Eosinophils # 05/05/2023 0.10  0.00 - 0.50 10*3/uL Final  . Basophils # 05/05/2023 0.00  0.00 - 0.20 10*3/uL Final  . Urine Culture 05/06/2023 No uropathogens isolated.   Final  . Ethanol 05/05/2023 <10  <10 mg/dL Final  . Amphetamines Screen, Urine 05/06/2023 Negative  Negative Final   Cutoff = 1000 ng/mL  . Barbiturates Screen, Urine 05/06/2023 Negative  Negative Final   Cutoff = 200 ng/mL  . Benzodiazepines Screen, Urine 05/06/2023 Negative  Negative Final   Cutoff = 200 ng/mL  . Cocaine Screen, Urine 05/06/2023 Negative  Negative Final   Cutoff = 300 ng/mL  . Opiates  Screen, Urine 05/06/2023 Positive (A)  Negative Final   Cutoff = 300 ng/mL  . Fentanyl  Screen, Urine 05/06/2023 Negative  Negative Final   Cutoff = 1 ng/mL  . Marijuana (THC) Screen, Urine 05/06/2023 Positive (A)  Negative Final   Cutoff = 50 ng/mL  . Creatinine, Urine 05/06/2023 132  >=20 mg/dL Final    IMAGING DATA    Sharri Fortune, MD 04/05/2023  6:07 AM EST     Please inform patient that MRI of her brain does not show any new changes in comparison to the previous study from 2022.  This is reassuring.  We can review images at our next visit together.         MRI Brain WO Contrast: Patient Communication   Add Comments <redacted file path>   Not seen   Study Result  Narrative & Impression  CLINICAL DATA:  Provided history: Multiple sclerosis.   EXAM: MRI HEAD WITHOUT CONTRAST   TECHNIQUE: Multiplanar, multiecho pulse sequences of the brain and surrounding structures were obtained without intravenous contrast.   COMPARISON:  Prior brain MRI examinations 12/10/2020 and earlier.   FINDINGS: Brain:   Mild generalized parenchymal volume loss.   Multifocal T2 FLAIR hyperintense signal abnormality within the bilateral deep periventricular and juxtacortical cerebral white matter, corpus callosum, left lateral pons (series 9, image 17) and right dorsilateral pons/right middle cerebellar peduncle (series 9, images 15 and 16) compatible with the provided history of multiple sclerosis. Superimposed chronic small vessel ischemic disease may be present. These findings have not appreciably changed from the prior brain MRI of 12/10/2020.   Foci of chronic cortical/subcortical T2 FLAIR hyperintense signal abnormality within the mid-to-posterior left frontal lobe/frontal operculum and left occipital lobe, and small chronic foci of T2 FLAIR hyperintense signal abnormality within the left thalamus, also unchanged. These foci are favored to reflect chronic infarcts.   There is no  acute infarct.   No evidence of an intracranial mass.   No chronic intracranial blood products.   No extra-axial fluid collection.   No midline shift.   Vascular: Maintained flow voids within the proximal large arterial vessels.   Skull and upper cervical spine: No focal worrisome marrow lesion. Incompletely assessed cervical spondylosis.   Sinuses/Orbits: No mass or acute finding within the imaged orbits. Mild mucosal thickening within the bilateral ethmoid and maxillary sinuses.   IMPRESSION: 1. Multifocal T2 FLAIR hyperintense signal abnormality within the supratentorial and infratentorial brain, as described and unchanged from the prior MRI of 12/10/2020. Given the provided history of multiple sclerosis, these findings likely reflect a combination of chronic demyelinating disease and chronic ischemia. 2. Mild generalized parenchymal volume loss.  3. Mild paranasal sinus mucosal thickening.     Electronically Signed   By: Rockey Childs D.O.   On: 04/04/2023 11:13       CLINICAL DATA:  Multiple sclerosis, new event.  Weakness.   EXAM:  MRI HEAD WITHOUT AND WITH CONTRAST   MRI CERVICAL SPINE WITHOUT AND WITH CONTRAST   TECHNIQUE:  Multiplanar, multiecho pulse sequences of the brain and surrounding  structures, and cervical spine, to include the craniocervical  junction and cervicothoracic junction, were obtained without and  with intravenous contrast.   CONTRAST:  4mL GADAVIST  GADOBUTROL  1 MMOL/ML IV SOLN   COMPARISON:  Head CT 11/01/2019 and head MRI 06/22/2017   FINDINGS:  MRI HEAD FINDINGS   Multiple sequences are moderately motion degraded.   Brain: There is no evidence of an acute infarct, intracranial  hemorrhage, mass, midline shift, or extra-axial fluid collection.  The ventricles are normal in size. Patchy and confluent T2  hyperintensities in the cerebral white matter bilaterally  predominantly affect the periventricular white matter,  particularly  in the periatrial regions, and have mildly progressed from the prior  MRI. A small region of cortical and subcortical encephalomalacia in  the medial left occipital lobe is unchanged. Abnormal T2  hyperintensity bilaterally in the pons/anterior aspects of the  middle cerebellar peduncles is unchanged. No abnormal enhancement or  restricted diffusion is identified.   Vascular: Major intracranial vascular flow voids are grossly  preserved.   Skull and upper cervical spine: No suspicious marrow lesion.   Sinuses/Orbits: Grossly unremarkable orbits. Paranasal sinuses and  mastoid air cells are clear.   Other: None.   MRI CERVICAL SPINE FINDINGS   The study is moderately to severely motion degraded despite repeated  imaging attempts.   Alignment: Straightening of the normal cervical lordosis. Trace  retrolisthesis of C3 on C4 and C4 on C5.   Vertebrae: No fracture, suspicious osseous lesion, significant  marrow edema, or evidence of discitis. Hemangioma in the T2  vertebral body. Moderate disc space narrowing from C3-4 to C6-7 with  associated degenerative endplate changes.   Cord: The spinal cord is poorly evaluated due to motion. There  appears to be a subcentimeter focus of hyperintensity in the dorsal  midline of the cord at C2 on both the sagittal STIR and axial T2  gradient echo sequences. No abnormal enhancement is identified.   Posterior Fossa, vertebral arteries, paraspinal tissues: Minimal  prevertebral soft tissue swelling in the upper cervical spine.  Grossly preserved vertebral artery flow voids.   Disc levels: Detailed assessment of degenerative changes is limited  by motion. Disc bulging and spurring result in mild spinal stenosis  at C3-4 and C4-5. There is multilevel neural foraminal stenosis  which appears severe bilaterally at C4-5 and moderate to severe at  C5-6.   IMPRESSION:  1. Motion degraded head MRI without evidence of an acute   intracranial abnormality.  2. Mild progression of white matter disease since 2019 with clinical  history of multiple sclerosis. No evidence of active demyelination.  3. Severely motion degraded cervical spine MRI with a suspected  small chronic demyelinating plaque at C2.  4. Multilevel disc degeneration resulting in up to mild spinal  stenosis and severe neural foraminal stenosis.    Electronically Signed    By: Dasie Hamburg M.D.    On: 05/29/2020 13:15 ____________________  MDM  Orders Orders Placed This Encounter  Procedures  . Ambulatory referral to Pain Medicine    Standing Status:   Future  Expiration Date:   10/08/2024    Referral Priority:   Routine    Referral Type:   PROVIDER REFERRAL    Requested Specialty:   Pain Medicine    Number of Visits Requested:   1   Orders Placed This Encounter  Medications  . oxyCODONE  (ROXICODONE ) 5 mg immediate release tablet    Sig: Take 1 tablet (5 mg total) by mouth every 6 (six) hours as needed for severe pain (7-10).    Dispense:  60 tablet    Refill:  0  . DISCONTD: OXcarbazepine  (TRILEPTAL ) 150 mg tablet    Sig: Take 1 tablet in AM, take 2 tabs in PM.

## 2023-09-28 NOTE — Progress Notes (Signed)
 FAMILY MEDIC[facility] - [facility] [place] [address]  September 28, 2023   Subjective:   Chief Complaint  Patient presents with  . Hypertension     Shawna Smith is a 68 y.o. female presenting for routine follow-up assessment of chronic hypertension. She reports additional concerns for this visit.  She has not been checking her blood pressure at home. She is not taking her medication as prescribed. She denies concerns about her medication but is not taking them because she states swallowing often exacerbates her pain from the trigeminal neuralgia.  She denies chest pain or pressure, palpitations, shortness of breath on exertion, headaches, dizziness, light-headedness, or swelling of her lower extremities.  She is not exercising routinely. Her diet is not balanced because it hurts to eat.    With regards to her pain, she underwent a gamma knife procedure on June 10th, and reports that the pain has not improved. She has been referred to pain management and has an appointment on September 8th. She states that the pain is sometimes exacerbated by swallowing, chewing, and talking. She reports that a refill request for her medications was declined by her neurologist.   Allergies Allergies[1]  Medications Current Medications[2]  Medical history Medical History[3]  Surgical History Surgical History[4]  Social History Social History[5]   Family History Family History[6]   ROS:  Review of Systems  Constitutional:  Positive for fatigue. Negative for appetite change, chills, diaphoresis and fever.  HENT:  Negative for congestion, rhinorrhea and sore throat.   Respiratory:  Negative for cough and shortness of breath.   Cardiovascular:  Negative for chest pain and palpitations.  Gastrointestinal:  Negative for abdominal pain, diarrhea, nausea and vomiting.  Genitourinary:  Negative for dysuria.  Neurological:  Positive for headaches. Negative for dizziness and  light-headedness.    Objective:   Vital Signs Vitals:   09/28/23 1420  BP: (!) 135/96  Pulse: 87  Resp: 16  SpO2: 95%  Weight: 41.6 kg (91 lb 12.8 oz)  Height: 1.6 m (5' 3)     Physical Exam Vitals reviewed.  Constitutional:      General: She is not in acute distress.    Appearance: She is not ill-appearing.  HENT:     Head: Normocephalic.     Right Ear: External ear normal.     Left Ear: External ear normal.     Nose: No congestion or rhinorrhea.   Eyes:     General: No scleral icterus.       Right eye: No discharge.        Left eye: No discharge.    Cardiovascular:     Rate and Rhythm: Normal rate. Rhythm irregular.     Pulses: Normal pulses.     Heart sounds: Normal heart sounds. No murmur heard.    Comments: Likely premature beats auscultated, though difficult to tell if there's atrial fibrillation as the rhythm is irregularly irregular. Pulmonary:     Effort: Pulmonary effort is normal. No respiratory distress.     Breath sounds: Normal breath sounds.   Musculoskeletal:     Cervical back: No rigidity.   Skin:    General: Skin is warm and dry.     Capillary Refill: Capillary refill takes less than 2 seconds.   Neurological:     Mental Status: She is alert and oriented to person, place, and time.     Gait: Gait normal.   Psychiatric:  Behavior: Behavior normal.        Thought Content: Thought content normal.        Judgment: Judgment normal.      Assessment/Plan:    Problem List Items Addressed This Visit     Essential hypertension - Primary   Current Assessment & Plan  Asymptomatic hypertension, with blood pressure slightly above goal at this visit. Her irregular rhythm, when auscultated, is supraventricular complexes on the EKG; no atrial fibrillation. Advised she cut her amlodipine  in half, and utilize that for blood pressure control, if she truly can't take all her medications due to pain.   Advised that she reach back out to her  neurologist as the last documentation noted (phone call last week) does not indicate declined medication refills, just that the request for 2 of them was too early. Advised she keep her pain management appointment, but would consider referral to a second clinic if needed.         After discussing questions and concerns, goals of care, medication side effects (if changes to their medications were made), and adequate follow-up, the patient verbalizes understanding and in agreement with the above plan. Advised patient to call clinic or return for visit if condition worsens or symptoms recur.  There are no barriers to discussed goal.  Return in about 4 months (around 01/29/2024) for annual medicare wellness, annual comprehensive exam.   This document serves as a record of services personally performed by Dr. Helene Fare.  It was created on their behalf by Theodor Hoff, CMA, a trained medical scribe, and Certified Medical Assistant (CMA). During the course of documenting the history, physical exam and medical decision making, I was functioning as a Stage manager. The creation of this record is the provider's dictation and/or activities during the visit.  Electronically signed by Theodor Hoff, CMA 09/28/2023 2:20 PM   Helene Fare, MD       [1] Allergies Allergen Reactions  . Ace Inhibitors Anaphylaxis, Itching, Other (See Comments) and Swelling    Site of swelling not noted  Tongue swells  . Lisinopril Swelling  . Penicillins Itching, Other (See Comments), Shortness Of Breath and Swelling  . Sulfa (Sulfonamide Antibiotics) Itching, Other (See Comments) and Swelling  [2] Current Outpatient Medications  Medication Sig Dispense Refill  . carbamide peroxide (DEBROX) 6.5 % otic solution Administer 5 drops into each ear 2 (two) times a day.    . ibuprofen  (MOTRIN ) 200 mg tablet Take 200 mg by mouth every 6 (six) hours as needed for mild pain (1-3).    . OXcarbazepine   (TRILEPTAL ) 150 mg tablet Take 1 tablet in AM, take 2 tabs in PM. 90 tablet 0  . oxyCODONE  (ROXICODONE ) 5 mg immediate release tablet Take 1 tablet (5 mg total) by mouth every 6 (six) hours as needed for severe pain (7-10). 60 tablet 0  . teriflunomide 7 mg tab Take 1 tablet (7 mg total) by mouth nightly. 30 tablet 2  . albuterol  HFA (PROVENTIL  HFA;VENTOLIN  HFA;PROAIR  HFA) 90 mcg/actuation inhaler Inhale 2 puffs every 6 (six) hours as needed for wheezing. (Patient not taking: Reported on 09/28/2023) 1 each 1  . amLODIPine  (NORVASC ) 10 mg tablet TAKE 1 TABLET(10 MG) BY MOUTH DAILY (Patient not taking: Reported on 09/28/2023) 90 tablet 0  . aspirin  81 mg EC tablet Take 81 mg by mouth daily. (Patient not taking: Reported on 09/28/2023)    . docosahexaenoic acid-epa (Fish OiL) 120-180 mg cap Take 1 capsule by mouth daily. (Patient not taking:  Reported on 09/28/2023)    . ipratropium (ATROVENT) 42 mcg (0.06 %) spry nasal spray Administer 1 spray into each nostril 2 (two) times a day. (Patient not taking: Reported on 09/28/2023) 15 mL 1  . magnesium  oxide (MAG-OX) 250 mg tablet Take 250 mg by mouth daily.    . metoprolol  succinate (TOPROL  XL) 100 mg 24 hr tablet TAKE 1 TABLET(100 MG) BY MOUTH DAILY (Patient not taking: Reported on 09/28/2023) 90 tablet 0  . nutritional drink (Ensure Active High Protein) liqd liquid DRINK 1 CONTAINER THREE TIMES DAILY WITH A MEAL (Patient not taking: Reported on 09/28/2023) 100 Bottle 3  . pantoprazole  (PROTONIX ) 40 mg EC tablet Take 1 tablet (40 mg total) by mouth every morning before breakfast. (Patient not taking: Reported on 09/28/2023) 90 tablet 1  . potassium chloride  20 mEq ER tablet Take 1 tablet (20 mEq total) by mouth daily. 90 tablet 1  . rosuvastatin (CRESTOR) 5 mg tablet TAKE 1 TABLET(5 MG) BY MOUTH DAILY (Patient not taking: Reported on 09/28/2023) 90 tablet 0  . traZODone  (DESYREL ) 100 mg tablet Take 100 mg by mouth at bedtime. (Patient not taking: Reported on  09/28/2023)     No current facility-administered medications for this visit.  [3] Past Medical History: Diagnosis Date  . Acute respiratory failure with hypoxia    (CMD) 05/28/2020  . Cataract   . Chronic airway obstruction    (CMD)   . Closed displaced fracture of left calcaneus   . Closed fracture of left distal radius 07/31/2016  . Closed fracture of left shoulder   . History of prolonged Q-T interval on ECG 06/29/2023  . Hyperglyceridemia   . Hypertension   . Hypokalemia   . 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 Malnutrition of moderate degree (CMD) 12/23/2021  . Metacarpal bone fracture 06/18/2018  . Multiple sclerosis    (CMD)    reportedly diagnosed in the mid 2000s (circa 2004)  . Optic neuritis   . Osteoporosis   . Pancreatitis (CMD) 12/20/2021  . 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 Pneumonia of right lung due to infectious organism 02/03/2023  . Prolonged QT interval 05/28/2020  [4] Past Surgical History: Procedure Laterality Date  . BACK SURGERY    . BREAST BIOPSY Right    20 yrs ago  . CHOLECYSTECTOMY    [5] Social History Tobacco Use  . Smoking status: Former    Current packs/day: 0.50    Average packs/day: 1 pack/day for 50.6 years (49.3 ttl pk-yrs)    Types: Cigarettes    Start date: 1975    Passive exposure: Current  . Smokeless tobacco: Never  . Tobacco comments:    Per 2025 LS form/chart (1); history of 15 cigarettes-1.5 ppd  Substance Use Topics  . Alcohol  use: Not Currently    Alcohol /week: 12.0 standard drinks of alcohol     Types: 12 Standard drinks or equivalent per week    Comment: some  . Drug use: No  [6] Family History Problem Relation Name Age of Onset  . Heart disease Mother    . Cancer Father    . Glaucoma Sister    . Breast cancer Other Mgma   . Anesthesia problems Neg Hx    . Arthritis Neg Hx    . Asthma Neg Hx    . Cerebral palsy Neg Hx    . Clotting disorder Neg Hx    . Club foot Neg Hx    . Collagen  disease Neg Hx    .  Deep vein thrombosis Neg Hx    . Diabetes Neg Hx    . Gait disorder Neg Hx    . Gout Neg Hx    . Hip dysplasia Neg Hx    . Hip fracture Neg Hx    . Hypermobility Neg Hx    . Hypertension Neg Hx    . Osteoporosis Neg Hx    . Other Neg Hx    . Pulmonary disease Neg Hx    . Rheumatologic disease Neg Hx    . Scoliosis Neg Hx    . Spina bifida Neg Hx    . Stroke Neg Hx    . Thyroid disease Neg Hx    . Vasculitis Neg Hx    . Macular degeneration Neg Hx

## 2023-09-29 NOTE — Telephone Encounter (Signed)
 Name of Caller/Relationship:    Medication: oxyCODONE  (ROXICODONE ) 5 mg immediate release tablet  OXcarbazepine  (TRILEPTAL ) 150 mg tablet     Pharmacy and location: [facility] on [address] back number is:  [phone]   Patient aware to allow 48-72 hours for refill request.  How many pills do you have left    Last OV: 09/08/23   Upcoming OV:

## 2023-09-29 NOTE — Telephone Encounter (Signed)
 RX request already sent to [facility] to address

## 2023-09-30 ENCOUNTER — Other Ambulatory Visit: Payer: Self-pay

## 2023-09-30 ENCOUNTER — Inpatient Hospital Stay (HOSPITAL_BASED_OUTPATIENT_CLINIC_OR_DEPARTMENT_OTHER)
Admission: EM | Admit: 2023-09-30 | Discharge: 2023-10-08 | DRG: 640 | Disposition: A | Discharge status: Home Health | Attending: Internal Medicine | Admitting: Internal Medicine

## 2023-09-30 DIAGNOSIS — E876 Hypokalemia: Principal | ICD-10-CM | POA: Diagnosis present

## 2023-09-30 DIAGNOSIS — J189 Pneumonia, unspecified organism: Secondary | ICD-10-CM | POA: Diagnosis not present

## 2023-09-30 DIAGNOSIS — Z681 Body mass index (BMI) 19 or less, adult: Secondary | ICD-10-CM

## 2023-09-30 DIAGNOSIS — G8929 Other chronic pain: Secondary | ICD-10-CM | POA: Diagnosis present

## 2023-09-30 DIAGNOSIS — R64 Cachexia: Secondary | ICD-10-CM | POA: Diagnosis present

## 2023-09-30 DIAGNOSIS — Z882 Allergy status to sulfonamides status: Secondary | ICD-10-CM

## 2023-09-30 DIAGNOSIS — I160 Hypertensive urgency: Secondary | ICD-10-CM | POA: Diagnosis not present

## 2023-09-30 DIAGNOSIS — Z7722 Contact with and (suspected) exposure to environmental tobacco smoke (acute) (chronic): Secondary | ICD-10-CM | POA: Diagnosis present

## 2023-09-30 DIAGNOSIS — M810 Age-related osteoporosis without current pathological fracture: Secondary | ICD-10-CM | POA: Diagnosis present

## 2023-09-30 DIAGNOSIS — Z888 Allergy status to other drugs, medicaments and biological substances status: Secondary | ICD-10-CM

## 2023-09-30 DIAGNOSIS — Z7982 Long term (current) use of aspirin: Secondary | ICD-10-CM

## 2023-09-30 DIAGNOSIS — K219 Gastro-esophageal reflux disease without esophagitis: Secondary | ICD-10-CM | POA: Diagnosis present

## 2023-09-30 DIAGNOSIS — Z79899 Other long term (current) drug therapy: Secondary | ICD-10-CM

## 2023-09-30 DIAGNOSIS — R110 Nausea: Secondary | ICD-10-CM | POA: Diagnosis not present

## 2023-09-30 DIAGNOSIS — J9811 Atelectasis: Secondary | ICD-10-CM | POA: Diagnosis present

## 2023-09-30 DIAGNOSIS — Z87892 Personal history of anaphylaxis: Secondary | ICD-10-CM

## 2023-09-30 DIAGNOSIS — G35 Multiple sclerosis: Secondary | ICD-10-CM | POA: Diagnosis present

## 2023-09-30 DIAGNOSIS — I959 Hypotension, unspecified: Secondary | ICD-10-CM | POA: Diagnosis not present

## 2023-09-30 DIAGNOSIS — I10 Essential (primary) hypertension: Secondary | ICD-10-CM | POA: Diagnosis present

## 2023-09-30 DIAGNOSIS — R197 Diarrhea, unspecified: Secondary | ICD-10-CM | POA: Diagnosis present

## 2023-09-30 DIAGNOSIS — Z8673 Personal history of transient ischemic attack (TIA), and cerebral infarction without residual deficits: Secondary | ICD-10-CM

## 2023-09-30 DIAGNOSIS — J440 Chronic obstructive pulmonary disease with acute lower respiratory infection: Secondary | ICD-10-CM | POA: Diagnosis not present

## 2023-09-30 DIAGNOSIS — Z91048 Other nonmedicinal substance allergy status: Secondary | ICD-10-CM

## 2023-09-30 DIAGNOSIS — Z880 Allergy status to penicillin: Secondary | ICD-10-CM

## 2023-09-30 DIAGNOSIS — J441 Chronic obstructive pulmonary disease with (acute) exacerbation: Secondary | ICD-10-CM | POA: Diagnosis not present

## 2023-09-30 DIAGNOSIS — J9601 Acute respiratory failure with hypoxia: Secondary | ICD-10-CM | POA: Diagnosis not present

## 2023-09-30 DIAGNOSIS — G500 Trigeminal neuralgia: Secondary | ICD-10-CM | POA: Diagnosis present

## 2023-09-30 DIAGNOSIS — Z87891 Personal history of nicotine dependence: Secondary | ICD-10-CM

## 2023-09-30 DIAGNOSIS — R531 Weakness: Secondary | ICD-10-CM

## 2023-09-30 LAB — CBC WITH DIFFERENTIAL/PLATELET
Abs Immature Granulocytes: 0.01 K/uL (ref 0.00–0.07)
Basophils Absolute: 0 K/uL (ref 0.0–0.1)
Basophils Relative: 1 %
Eosinophils Absolute: 0.1 K/uL (ref 0.0–0.5)
Eosinophils Relative: 2 %
HCT: 36.4 % (ref 36.0–46.0)
Hemoglobin: 12.7 g/dL (ref 12.0–15.0)
Immature Granulocytes: 0 %
Lymphocytes Relative: 35 %
Lymphs Abs: 1.6 K/uL (ref 0.7–4.0)
MCH: 30.5 pg (ref 26.0–34.0)
MCHC: 34.9 g/dL (ref 30.0–36.0)
MCV: 87.5 fL (ref 80.0–100.0)
Monocytes Absolute: 0.8 K/uL (ref 0.1–1.0)
Monocytes Relative: 17 %
Neutro Abs: 2 K/uL (ref 1.7–7.7)
Neutrophils Relative %: 45 %
Platelets: 309 K/uL (ref 150–400)
RBC: 4.16 MIL/uL (ref 3.87–5.11)
RDW: 14.6 % (ref 11.5–15.5)
WBC: 4.6 K/uL (ref 4.0–10.5)
nRBC: 0 % (ref 0.0–0.2)

## 2023-09-30 LAB — BASIC METABOLIC PANEL WITH GFR
Anion gap: 16 — ABNORMAL HIGH (ref 5–15)
BUN: 6 mg/dL — ABNORMAL LOW (ref 8–23)
CO2: 25 mmol/L (ref 22–32)
Calcium: 9.5 mg/dL (ref 8.9–10.3)
Chloride: 94 mmol/L — ABNORMAL LOW (ref 98–111)
Creatinine, Ser: 0.79 mg/dL (ref 0.44–1.00)
GFR, Estimated: >60 mL/min (ref >60)
Glucose, Bld: 149 mg/dL — ABNORMAL HIGH (ref 70–99)
Potassium: <2 mmol/L — CL (ref 3.5–5.1)
Sodium: 136 mmol/L (ref 135–145)

## 2023-09-30 LAB — URINALYSIS, MICROSCOPIC (REFLEX)

## 2023-09-30 LAB — URINALYSIS, ROUTINE W REFLEX MICROSCOPIC
Bilirubin Urine: NEGATIVE
Glucose, UA: NEGATIVE mg/dL
Hgb urine dipstick: NEGATIVE
Ketones, ur: NEGATIVE mg/dL
Nitrite: NEGATIVE
Protein, ur: 30 mg/dL — AB
Specific Gravity, Urine: 1.015 (ref 1.005–1.030)
pH: 7 (ref 5.0–8.0)

## 2023-09-30 LAB — MAGNESIUM: Magnesium: 1.2 mg/dL — ABNORMAL LOW (ref 1.7–2.4)

## 2023-09-30 LAB — TROPONIN T, HIGH SENSITIVITY: Troponin T High Sensitivity: 39 ng/L — ABNORMAL HIGH (ref <19)

## 2023-09-30 MED ORDER — POTASSIUM CHLORIDE 10 MEQ/100ML IV SOLN
10.0000 meq | INTRAVENOUS | Status: AC
  Administered 2023-09-30 – 2023-10-01 (×6): 10 meq via INTRAVENOUS
  Filled 2023-09-30 (×4): qty 100

## 2023-09-30 MED ORDER — POTASSIUM CHLORIDE CRYS ER 20 MEQ PO TBCR
40.0000 meq | EXTENDED_RELEASE_TABLET | Freq: Once | ORAL | Status: AC
  Administered 2023-09-30: 40 meq via ORAL
  Filled 2023-09-30: qty 2

## 2023-09-30 MED ORDER — MAGNESIUM SULFATE 2 GM/50ML IV SOLN
2.0000 g | Freq: Once | INTRAVENOUS | Status: AC
  Administered 2023-09-30: 2 g via INTRAVENOUS
  Filled 2023-09-30: qty 50

## 2023-09-30 NOTE — ED Triage Notes (Signed)
 Pt reports having blood work drawn today, notified that her potassium was low.  Results available in EMR.  Currently c/o L eye pain.

## 2023-09-30 NOTE — ED Provider Notes (Signed)
 [place] [facility] [place] Provider Note   CSN: [id number] Arrival date & time: 09/30/23  2102     Patient presents with: Abnormal Lab   Shawna Smith is a 68 y.o. female.  She has a history of MS and presumptive trigeminal neuralgia.  Has had left-sided facial pain for many years.  Recently had gamma knife surgery few months ago which did not seem to help much with her symptoms.  She saw her primary care doctor today and had blood work checked and they found her potassium to be critically low.  She said she has not been taking her medications very much because of her facial pain.  She denies any chest pain or shortness of breath.  She does endorse some general weakness and has had some intermittent diarrhea.  No syncope.  {Add pertinent medical, surgical, social history, OB history to YEP:67052} The history is provided by the patient.  Abnormal Lab Time since result:  Hours Patient referred by:  PCP Resulting agency:  External Result type: chemistry   Chemistry:    Potassium:  Low      Prior to Admission medications   Medication Sig Start Date End Date Taking? Authorizing Provider  acetaminophen  (TYLENOL ) 500 MG tablet Take 500 mg by mouth every 4 (four) hours as needed (pain).    [provider]  albuterol  (VENTOLIN  HFA) 108 (90 Base) MCG/ACT inhaler Inhale 2 puffs into the lungs 3 (three) times daily as needed for wheezing or shortness of breath. 12/23/21 01/22/22  Ghimire, Kuber, MD  amitriptyline (ELAVIL) 50 MG tablet Take 50 mg by mouth at bedtime. 03/13/19   [provider]  amLODipine  (NORVASC ) 10 MG tablet Take 10 mg by mouth at bedtime. 05/02/20   [provider]  aspirin  81 MG EC tablet Take 81 mg by mouth daily. 04/25/15   [provider]  Ensure Plus (ENSURE PLUS) LIQD Take 237 mLs by mouth 3 (three) times daily between meals.    [provider]  EPINEPHrine  0.3 mg/0.3 mL IJ SOAJ injection Inject 0.3  mg into the muscle once as needed for anaphylaxis. 12/23/21   Ghimire, Kuber, MD  ergocalciferol (VITAMIN D2) 1.25 MG (50000 UT) capsule Take 50,000 Units by mouth every Thursday.    [provider]  hydrALAZINE  (APRESOLINE ) 50 MG tablet Take 50 mg by mouth 2 (two) times daily.    [provider]  hydrOXYzine  (ATARAX ) 10 MG tablet Take 1 tablet (10 mg total) by mouth 3 (three) times daily as needed. 12/23/21   Ghimire, Kuber, MD  losartan -hydrochlorothiazide  (HYZAAR) 50-12.5 MG tablet Take 1 tablet by mouth daily. 03/05/20   [provider]  metoprolol  succinate (TOPROL -XL) 100 MG 24 hr tablet Take 100 mg by mouth daily. 03/05/20   [provider]  Omega-3 Fatty Acids (FISH OIL) 1000 MG CAPS Take 1,000 mg by mouth daily.    [provider]  OXcarbazepine  (TRILEPTAL ) 150 MG tablet Take 150 mg by mouth in the morning and at bedtime. 04/08/20   [provider]  oxyCODONE  (ROXICODONE ) 5 MG immediate release tablet Take 1 tablet (5 mg total) by mouth every 4 (four) hours as needed for severe pain (pain score 7-10). 04/05/23   Dreama Longs, MD  potassium chloride  SA (KLOR-CON  M) 20 MEQ tablet Take 20 mEq by mouth daily.    [provider]  tamsulosin (FLOMAX) 0.4 MG CAPS capsule Take 0.4 mg by mouth daily after breakfast.    [provider]  traMADol  (ULTRAM -ER) 100 MG 24 hr tablet Take 100 mg by mouth in the morning.    [provider]  traZODone  (DESYREL ) 50 MG tablet Take 100 mg by mouth at bedtime as needed for sleep.    [provider]    Allergies: Ace inhibitors, Lisinopril, Penicillins, Amoxicillin, Tape, and Sulfa antibiotics    Review of Systems  Constitutional:  Positive for fatigue. Negative for fever.  Eyes:  Positive for pain.  Respiratory:  Negative for shortness of breath.   Cardiovascular:  Negative for chest pain.  Gastrointestinal:  Positive for diarrhea. Negative for vomiting.    Updated Vital  Signs BP 130/81   Pulse 69   Temp 97.8 F (36.6 C) (Oral)   Resp 19   Ht 5' 3 (1.6 m)   Wt 41.3 kg   SpO2 98%   BMI 16.12 kg/m   Physical Exam Vitals and nursing note reviewed.  Constitutional:      General: She is not in acute distress.    Appearance: Normal appearance. She is well-developed.  HENT:     Head: Normocephalic and atraumatic.  Eyes:     Conjunctiva/sclera: Conjunctivae normal.  Cardiovascular:     Rate and Rhythm: Regular rhythm. Bradycardia present.     Heart sounds: No murmur heard. Pulmonary:     Effort: Pulmonary effort is normal. No respiratory distress.     Breath sounds: Normal breath sounds. No stridor. No wheezing.  Abdominal:     Palpations: Abdomen is soft.     Tenderness: There is no abdominal tenderness. There is no guarding or rebound.  Musculoskeletal:        General: No deformity.     Cervical back: Neck supple.  Skin:    General: Skin is warm and dry.  Neurological:     General: No focal deficit present.     Mental Status: She is alert.     GCS: GCS eye subscore is 4. GCS verbal subscore is 5. GCS motor subscore is 6.     Motor: No weakness.     (all labs ordered are listed, but only abnormal results are displayed) Labs Reviewed  BASIC METABOLIC PANEL WITH GFR  CBC WITH DIFFERENTIAL/PLATELET  MAGNESIUM   URINALYSIS, ROUTINE W REFLEX MICROSCOPIC  TROPONIN T, HIGH SENSITIVITY    EKG: EKG Interpretation Date/Time:  Thursday September 30 2023 21:13:12 EDT Ventricular Rate:  122 PR Interval:  116 QRS Duration:  76 QT Interval:  342 QTC Calculation: 450 R Axis:   76  Text Interpretation: Sinus tachycardia Paired ventricular premature complexes Abnormal R-wave progression, early transition Repol abnrm suggests ischemia, diffuse leads new ST changes from prior 10/23 Confirmed by Towana Sharper [phone]) on 09/30/2023 9:14:47 PM  Radiology: No results found.  {Document cardiac monitor, telemetry assessment procedure when  appropriate:32947} Procedures   Medications Ordered in the ED - No data to display    {Click here for ABCD2, HEART and other calculators REFRESH Note before signing:1}                              Medical Decision Making Amount and/or Complexity of Data Reviewed Labs: ordered.   This patient complains of ***; this involves an extensive number of treatment Options and is a complaint that carries with it a high risk of complications and morbidity. The differential includes ***  I ordered, reviewed and interpreted labs, which included *** I ordered medication *** and reviewed PMP when  indicated. I ordered imaging studies which included *** and I independently    visualized and interpreted imaging which showed *** Additional history obtained from *** Previous records obtained and reviewed *** I consulted *** and discussed lab and imaging findings and discussed disposition.  Cardiac monitoring reviewed, *** Social determinants considered, *** Critical Interventions: ***  After the interventions stated above, I reevaluated the patient and found *** Admission and further testing considered, ***   {Document critical care time when appropriate  Document review of labs and clinical decision tools ie CHADS2VASC2, etc  Document your independent review of radiology images and any outside records  Document your discussion with family members, caretakers and with consultants  Document social determinants of health affecting pt's care  Document your decision making why or why not admission, treatments were needed:32947:::1}   Final diagnoses:  None    ED Discharge Orders     None

## 2023-10-01 ENCOUNTER — Encounter (HOSPITAL_COMMUNITY): Payer: Self-pay | Admitting: Internal Medicine

## 2023-10-01 DIAGNOSIS — Z882 Allergy status to sulfonamides status: Secondary | ICD-10-CM | POA: Diagnosis not present

## 2023-10-01 DIAGNOSIS — Z888 Allergy status to other drugs, medicaments and biological substances status: Secondary | ICD-10-CM | POA: Diagnosis not present

## 2023-10-01 DIAGNOSIS — R197 Diarrhea, unspecified: Secondary | ICD-10-CM | POA: Diagnosis present

## 2023-10-01 DIAGNOSIS — K219 Gastro-esophageal reflux disease without esophagitis: Secondary | ICD-10-CM | POA: Diagnosis present

## 2023-10-01 DIAGNOSIS — I160 Hypertensive urgency: Secondary | ICD-10-CM | POA: Diagnosis not present

## 2023-10-01 DIAGNOSIS — J189 Pneumonia, unspecified organism: Secondary | ICD-10-CM | POA: Diagnosis not present

## 2023-10-01 DIAGNOSIS — Z87891 Personal history of nicotine dependence: Secondary | ICD-10-CM | POA: Diagnosis not present

## 2023-10-01 DIAGNOSIS — Z681 Body mass index (BMI) 19 or less, adult: Secondary | ICD-10-CM | POA: Diagnosis not present

## 2023-10-01 DIAGNOSIS — Z880 Allergy status to penicillin: Secondary | ICD-10-CM | POA: Diagnosis not present

## 2023-10-01 DIAGNOSIS — Z7982 Long term (current) use of aspirin: Secondary | ICD-10-CM | POA: Diagnosis not present

## 2023-10-01 DIAGNOSIS — J440 Chronic obstructive pulmonary disease with acute lower respiratory infection: Secondary | ICD-10-CM | POA: Diagnosis not present

## 2023-10-01 DIAGNOSIS — J9811 Atelectasis: Secondary | ICD-10-CM | POA: Diagnosis present

## 2023-10-01 DIAGNOSIS — J449 Chronic obstructive pulmonary disease, unspecified: Secondary | ICD-10-CM | POA: Diagnosis not present

## 2023-10-01 DIAGNOSIS — J441 Chronic obstructive pulmonary disease with (acute) exacerbation: Secondary | ICD-10-CM | POA: Diagnosis not present

## 2023-10-01 DIAGNOSIS — G8929 Other chronic pain: Secondary | ICD-10-CM | POA: Diagnosis present

## 2023-10-01 DIAGNOSIS — G35 Multiple sclerosis: Secondary | ICD-10-CM | POA: Diagnosis present

## 2023-10-01 DIAGNOSIS — G500 Trigeminal neuralgia: Secondary | ICD-10-CM | POA: Diagnosis present

## 2023-10-01 DIAGNOSIS — M810 Age-related osteoporosis without current pathological fracture: Secondary | ICD-10-CM | POA: Diagnosis present

## 2023-10-01 DIAGNOSIS — E876 Hypokalemia: Secondary | ICD-10-CM | POA: Diagnosis present

## 2023-10-01 DIAGNOSIS — R64 Cachexia: Secondary | ICD-10-CM | POA: Diagnosis present

## 2023-10-01 DIAGNOSIS — I959 Hypotension, unspecified: Secondary | ICD-10-CM | POA: Diagnosis not present

## 2023-10-01 DIAGNOSIS — J9601 Acute respiratory failure with hypoxia: Secondary | ICD-10-CM | POA: Diagnosis not present

## 2023-10-01 DIAGNOSIS — I10 Essential (primary) hypertension: Secondary | ICD-10-CM | POA: Diagnosis present

## 2023-10-01 DIAGNOSIS — Z79899 Other long term (current) drug therapy: Secondary | ICD-10-CM | POA: Diagnosis not present

## 2023-10-01 DIAGNOSIS — R110 Nausea: Secondary | ICD-10-CM | POA: Diagnosis not present

## 2023-10-01 LAB — BASIC METABOLIC PANEL WITH GFR
Anion gap: 14 (ref 5–15)
Anion gap: 11 (ref 5–15)
BUN: 7 mg/dL — ABNORMAL LOW (ref 8–23)
BUN: <5 mg/dL — ABNORMAL LOW (ref 8–23)
CO2: 27 mmol/L (ref 22–32)
CO2: 23 mmol/L (ref 22–32)
Calcium: 9.2 mg/dL (ref 8.9–10.3)
Calcium: 8.2 mg/dL — ABNORMAL LOW (ref 8.9–10.3)
Chloride: 96 mmol/L — ABNORMAL LOW (ref 98–111)
Chloride: 105 mmol/L (ref 98–111)
Creatinine, Ser: 0.68 mg/dL (ref 0.44–1.00)
Creatinine, Ser: 0.73 mg/dL (ref 0.44–1.00)
GFR, Estimated: >60 mL/min (ref >60)
GFR, Estimated: >60 mL/min (ref >60)
Glucose, Bld: 99 mg/dL (ref 70–99)
Glucose, Bld: 113 mg/dL — ABNORMAL HIGH (ref 70–99)
Potassium: 2.2 mmol/L — CL (ref 3.5–5.1)
Potassium: 2.8 mmol/L — ABNORMAL LOW (ref 3.5–5.1)
Sodium: 136 mmol/L (ref 135–145)
Sodium: 139 mmol/L (ref 135–145)

## 2023-10-01 LAB — TROPONIN T, HIGH SENSITIVITY: Troponin T High Sensitivity: 40 ng/L — ABNORMAL HIGH (ref <19)

## 2023-10-01 LAB — MAGNESIUM
Magnesium: 1.8 mg/dL (ref 1.7–2.4)
Magnesium: 1.8 mg/dL (ref 1.7–2.4)

## 2023-10-01 LAB — MRSA NEXT GEN BY PCR, NASAL: MRSA by PCR Next Gen: NOT DETECTED

## 2023-10-01 LAB — PHOSPHORUS
Phosphorus: 2.8 mg/dL (ref 2.5–4.6)
Phosphorus: 2.9 mg/dL (ref 2.5–4.6)

## 2023-10-01 MED ORDER — HYDROMORPHONE HCL 1 MG/ML IJ SOLN
0.5000 mg | INTRAMUSCULAR | Status: DC | PRN
  Administered 2023-10-01 – 2023-10-03 (×11): 0.5 mg via INTRAVENOUS
  Filled 2023-10-01 (×11): qty 1

## 2023-10-01 MED ORDER — POTASSIUM CHLORIDE CRYS ER 20 MEQ PO TBCR
40.0000 meq | EXTENDED_RELEASE_TABLET | Freq: Once | ORAL | Status: DC
  Filled 2023-10-01: qty 2

## 2023-10-01 MED ORDER — ACETAMINOPHEN 650 MG RE SUPP: 650.0000 mg | Freq: Four times a day (QID) | RECTAL | Status: DC | PRN

## 2023-10-01 MED ORDER — OXCARBAZEPINE 300 MG PO TABS
300.0000 mg | ORAL_TABLET | Freq: Two times a day (BID) | ORAL | Status: DC
  Administered 2023-10-01 – 2023-10-08 (×14): 300 mg via ORAL
  Filled 2023-10-01 (×15): qty 1

## 2023-10-01 MED ORDER — ASPIRIN 81 MG PO TBEC
81.0000 mg | DELAYED_RELEASE_TABLET | Freq: Every day | ORAL | Status: DC
  Administered 2023-10-02 – 2023-10-08 (×7): 81 mg via ORAL
  Filled 2023-10-01 (×8): qty 1

## 2023-10-01 MED ORDER — ONDANSETRON HCL 4 MG/2ML IJ SOLN
4.0000 mg | Freq: Four times a day (QID) | INTRAMUSCULAR | Status: DC | PRN
  Administered 2023-10-01: 4 mg via INTRAVENOUS
  Filled 2023-10-01 (×2): qty 2

## 2023-10-01 MED ORDER — LABETALOL HCL 5 MG/ML IV SOLN
10.0000 mg | INTRAVENOUS | Status: DC | PRN
  Administered 2023-10-01 – 2023-10-02 (×4): 10 mg via INTRAVENOUS
  Filled 2023-10-01 (×6): qty 4

## 2023-10-01 MED ORDER — PNEUMOCOCCAL 20-VAL CONJ VACC 0.5 ML IM SUSY
0.5000 mL | PREFILLED_SYRINGE | INTRAMUSCULAR | Status: DC
  Filled 2023-10-01: qty 0.5

## 2023-10-01 MED ORDER — ONDANSETRON HCL 4 MG PO TABS: 4.0000 mg | ORAL_TABLET | Freq: Four times a day (QID) | ORAL | Status: DC | PRN

## 2023-10-01 MED ORDER — TERIFLUNOMIDE 7 MG PO TABS: 7.0000 mg | ORAL_TABLET | Freq: Every day | ORAL | Status: DC

## 2023-10-01 MED ORDER — HYDRALAZINE HCL 20 MG/ML IJ SOLN
10.0000 mg | Freq: Four times a day (QID) | INTRAMUSCULAR | Status: DC | PRN
  Administered 2023-10-01 – 2023-10-02 (×2): 10 mg via INTRAVENOUS
  Filled 2023-10-01 (×3): qty 1

## 2023-10-01 MED ORDER — OXYCODONE HCL 5 MG PO TABS
5.0000 mg | ORAL_TABLET | ORAL | Status: DC | PRN
  Administered 2023-10-01 – 2023-10-03 (×7): 5 mg via ORAL
  Filled 2023-10-01 (×7): qty 1

## 2023-10-01 MED ORDER — AMLODIPINE BESYLATE 10 MG PO TABS
10.0000 mg | ORAL_TABLET | Freq: Every day | ORAL | Status: DC
  Administered 2023-10-01: 10 mg via ORAL
  Filled 2023-10-01: qty 1

## 2023-10-01 MED ORDER — ENOXAPARIN SODIUM 30 MG/0.3ML IJ SOSY
30.0000 mg | PREFILLED_SYRINGE | Freq: Every day | INTRAMUSCULAR | Status: DC
  Administered 2023-10-01 – 2023-10-08 (×8): 30 mg via SUBCUTANEOUS
  Filled 2023-10-01 (×8): qty 0.3

## 2023-10-01 MED ORDER — POTASSIUM CHLORIDE 10 MEQ/100ML IV SOLN
10.0000 meq | INTRAVENOUS | Status: AC
  Administered 2023-10-01 (×2): 10 meq via INTRAVENOUS
  Filled 2023-10-01 (×4): qty 100

## 2023-10-01 MED ORDER — CHLORHEXIDINE GLUCONATE CLOTH 2 % EX PADS
6.0000 | MEDICATED_PAD | Freq: Every day | CUTANEOUS | Status: DC
  Administered 2023-10-01 – 2023-10-08 (×5): 6 via TOPICAL
  Filled 2023-10-01: qty 6

## 2023-10-01 MED ORDER — POTASSIUM CHLORIDE 10 MEQ/100ML IV SOLN
10.0000 meq | INTRAVENOUS | Status: AC
  Administered 2023-10-01 (×6): 10 meq via INTRAVENOUS
  Filled 2023-10-01 (×6): qty 100

## 2023-10-01 MED ORDER — ALBUTEROL SULFATE (2.5 MG/3ML) 0.083% IN NEBU
2.5000 mg | INHALATION_SOLUTION | RESPIRATORY_TRACT | Status: DC | PRN
  Administered 2023-10-03: 2.5 mg via RESPIRATORY_TRACT
  Filled 2023-10-01: qty 3

## 2023-10-01 MED ORDER — MAGIC MOUTHWASH
5.0000 mL | Freq: Three times a day (TID) | ORAL | Status: DC
  Administered 2023-10-01 – 2023-10-08 (×20): 5 mL via ORAL
  Filled 2023-10-01 (×23): qty 5

## 2023-10-01 MED ORDER — ROSUVASTATIN CALCIUM 5 MG PO TABS
5.0000 mg | ORAL_TABLET | Freq: Every day | ORAL | Status: DC
  Administered 2023-10-02 – 2023-10-08 (×7): 5 mg via ORAL
  Filled 2023-10-01 (×8): qty 1

## 2023-10-01 MED ORDER — METOPROLOL SUCCINATE ER 50 MG PO TB24
100.0000 mg | ORAL_TABLET | Freq: Every day | ORAL | Status: DC
  Administered 2023-10-01 – 2023-10-08 (×7): 100 mg via ORAL
  Filled 2023-10-01 (×2): qty 4
  Filled 2023-10-01 (×2): qty 2
  Filled 2023-10-01 (×4): qty 4

## 2023-10-01 MED ORDER — ACETAMINOPHEN 325 MG PO TABS
650.0000 mg | ORAL_TABLET | Freq: Four times a day (QID) | ORAL | Status: DC | PRN
  Administered 2023-10-03 – 2023-10-08 (×6): 650 mg via ORAL
  Filled 2023-10-01 (×6): qty 2

## 2023-10-01 MED ORDER — PANTOPRAZOLE SODIUM 40 MG PO TBEC
40.0000 mg | DELAYED_RELEASE_TABLET | Freq: Every day | ORAL | Status: DC
Start: 2023-10-01 — End: 2023-10-08
  Administered 2023-10-02 – 2023-10-08 (×7): 40 mg via ORAL
  Filled 2023-10-01 (×8): qty 1

## 2023-10-01 NOTE — H&P (Signed)
 History and Physical  Alexsus Papadopoulos FMW:[id number] DOB: 23-Oct-1955 DOA: 09/30/2023  PCP: Charlyne Helling, MD   Chief Complaint: low K   HPI: Shawna Smith is a 68 y.o. female with medical history significant for MS, presumed trigeminal neuralgia with uncontrolled pain, hypertension admitted to the hospital for hypomagnesemia and hypokalemia.  She is followed by neurology Dr. Charlyne at [facility], takes Aubagio for her MS has left-sided facial pain presumptive trigeminal neuralgia had gamma knife surgery 08/10/2023 unfortunately without improvement.  Her neurologist has been uptitrating her oxcarbazepine  in an attempt to manage her pain.  She had follow-up appointment with her PCP on 7/30, lab work was drawn and patient was noted to have significant hypokalemia and hypomagnesemia, was called and told to come to the ER.  Lab work confirmed these electrolyte abnormalities as noted below, and patient was admitted for repletion.  Patient tells me that for the last couple of weeks, she has had watery nonbloody diarrhea up to 3 times a day.  Denies fevers, nausea, or abdominal pain.  Review of Systems: Please see HPI for pertinent positives and negatives. A complete 10 system review of systems are otherwise negative.  Past Medical History:  Diagnosis Date   Hypertension    MS (multiple sclerosis) (HCC)    Osteoporosis    Past Surgical History:  Procedure Laterality Date   OPEN REDUCTION INTERNAL FIXATION (ORIF) METACARPAL Left 06/18/2018   Procedure: OPEN REDUCTION INTERNAL FIXATION (ORIF) LEFT HAND, MUTIPLE LEFT METACARPAL FRACTURES, AND OPEN REDUCTION OF MCP JOINT OF LEFT INDEX FINGER;  Surgeon: Camella Fallow, MD;  Location: [facility] OR;  Service: Orthopedics;  Laterality: Left;   Social History:  reports that she has been smoking cigarettes. She has never used smokeless tobacco. She reports current alcohol  use. She reports that she does not use drugs.  Allergies  Allergen Reactions   Ace Inhibitors Itching,  Swelling and Other (See Comments)    Site of swelling not noted   Lisinopril Anaphylaxis, Swelling and Other (See Comments)    Tongue swells   Penicillins Shortness Of Breath, Itching and Swelling   Amoxicillin Itching and Swelling   Sulfa Antibiotics Itching and Swelling   Tape Itching and Other (See Comments)    CAUSES EXTREME ITCHING    History reviewed. No pertinent family history.   Prior to Admission medications   Medication Sig Start Date End Date Taking? Authorizing Provider  acetaminophen  (TYLENOL ) 500 MG tablet Take 500 mg by mouth every 4 (four) hours as needed (pain).   Yes [provider]  amLODipine  (NORVASC ) 10 MG tablet Take 10 mg by mouth at bedtime. 05/02/20  Yes [provider]  aspirin  81 MG EC tablet Take 81 mg by mouth daily. 04/25/15  Yes [provider]  EPINEPHrine  0.3 mg/0.3 mL IJ SOAJ injection Inject 0.3 mg into the muscle once as needed for anaphylaxis. 12/23/21  Yes Ghimire, Kuber, MD  metoprolol  succinate (TOPROL -XL) 100 MG 24 hr tablet Take 100 mg by mouth daily. 03/05/20  Yes [provider]  Oxcarbazepine  (TRILEPTAL ) 300 MG tablet Take 300 mg by mouth 2 (two) times daily. 09/29/23  Yes [provider]  oxyCODONE  (ROXICODONE ) 5 MG immediate release tablet Take 1 tablet (5 mg total) by mouth every 4 (four) hours as needed for severe pain (pain score 7-10). 04/05/23  Yes Dreama Longs, MD  pantoprazole  (PROTONIX ) 40 MG tablet Take 40 mg by mouth daily. 06/29/23  Yes [provider]  rosuvastatin (CRESTOR) 5 MG tablet Take 5 mg by  mouth daily. 07/23/23  Yes [provider]  Teriflunomide 7 MG TABS Take 7 mg by mouth at bedtime. 03/30/22  Yes [provider]  traZODone  (DESYREL ) 50 MG tablet Take 100 mg by mouth at bedtime as needed for sleep.   Yes [provider]  albuterol  (VENTOLIN  HFA) 108 (90 Base) MCG/ACT inhaler Inhale 2 puffs into the lungs 3 (three) times daily as needed for  wheezing or shortness of breath. 12/23/21 10/01/23  Ghimire, Kuber, MD  amitriptyline (ELAVIL) 50 MG tablet Take 50 mg by mouth at bedtime. Patient not taking: Reported on 10/01/2023 03/13/19   [provider]  Ensure Plus (ENSURE PLUS) LIQD Take 237 mLs by mouth 3 (three) times daily between meals. Patient not taking: Reported on 10/01/2023    [provider]  ergocalciferol (VITAMIN D2) 1.25 MG (50000 UT) capsule Take 50,000 Units by mouth every Thursday. Patient not taking: Reported on 10/01/2023    [provider]  hydrALAZINE  (APRESOLINE ) 50 MG tablet Take 50 mg by mouth 2 (two) times daily. Patient not taking: Reported on 10/01/2023    [provider]  losartan -hydrochlorothiazide  (HYZAAR) 50-12.5 MG tablet Take 1 tablet by mouth daily. Patient not taking: Reported on 10/01/2023 03/05/20   [provider]  Omega-3 Fatty Acids (FISH OIL) 1000 MG CAPS Take 1,000 mg by mouth daily. Patient not taking: Reported on 10/01/2023    [provider]  OXcarbazepine  (TRILEPTAL ) 150 MG tablet Take 150 mg by mouth in the morning and at bedtime. Patient not taking: Reported on 10/01/2023 04/08/20   [provider]  potassium chloride  SA (KLOR-CON  M) 20 MEQ tablet Take 20 mEq by mouth daily. Patient not taking: Reported on 10/01/2023    [provider]  tamsulosin (FLOMAX) 0.4 MG CAPS capsule Take 0.4 mg by mouth daily after breakfast. Patient not taking: Reported on 10/01/2023    [provider]  traMADol  (ULTRAM -ER) 100 MG 24 hr tablet Take 100 mg by mouth in the morning. Patient not taking: Reported on 10/01/2023    [provider]    Physical Exam: BP (!) 158/73   Pulse 64   Temp 97.6 F (36.4 C) (Oral)   Resp 18   Ht 5' 3 (1.6 m)   Wt 42.3 kg   SpO2 93%   BMI 16.52 kg/m  General:  Alert, oriented, calm, in no acute distress, she is holding the left side of her face due to discomfort Cardiovascular: RRR, no murmurs or rubs,  no peripheral edema  Respiratory: clear to auscultation bilaterally, no wheezes, no crackles  Abdomen: soft, nontender, nondistended, normal bowel tones heard  Skin: dry, no rashes  Musculoskeletal: no joint effusions, normal range of motion  Psychiatric: appropriate affect, normal speech  Neurologic: extraocular muscles intact, clear speech, moving all extremities with intact sensorium         Labs on Admission:  Basic Metabolic Panel: Recent Labs  Lab 09/30/23 2128 10/01/23 0105 10/01/23 0633  NA 136 136  --   K <2.0* 2.2*  --   CL 94* 96*  --   CO2 25 27  --   GLUCOSE 149* 99  --   BUN 6* 7*  --   CREATININE 0.79 0.68  --   CALCIUM 9.5 9.2  --   MG 1.2*  --  1.8  PHOS  --   --  2.8   Liver Function Tests: No results for input(s): AST, ALT, ALKPHOS, BILITOT, PROT, ALBUMIN in the last 168 hours.  No results for input(s): LIPASE, AMYLASE in the last 168 hours. No results for input(s): AMMONIA in the last 168 hours. CBC: Recent Labs  Lab 09/30/23 2128  WBC 4.6  NEUTROABS 2.0  HGB 12.7  HCT 36.4  MCV 87.5  PLT 309   Cardiac Enzymes: No results for input(s): CKTOTAL, CKMB, CKMBINDEX, TROPONINI in the last 168 hours. BNP (last 3 results) No results for input(s): BNP in the last 8760 hours.  ProBNP (last 3 results) No results for input(s): PROBNP in the last 8760 hours.  CBG: No results for input(s): GLUCAP in the last 168 hours.  Radiological Exams on Admission: No results found. Assessment/Plan Jodean Valade is a 68 y.o. female with medical history significant for MS, presumed trigeminal neuralgia with uncontrolled pain, hypertension admitted to the hospital for hypomagnesemia and hypokalemia.  Severe hypokalemia and hypomagnesemia-I suspect this is due to poor p.o. intake, as well as ongoing diarrhea of unclear etiology -Observation admission -Monitor on telemetry -Continue aggressive potassium and magnesium  repletion -Recheck  electrolytes this afternoon after repletion  Diarrhea-patient without abdominal discomfort, bleeding, fevers or other evidence of complication.  Doubt infectious diarrhea.  States that she has now not had a bowel movement in the last 24 hours. -Imodium as needed  Hypertension-amlodipine , Toprol -XL  GERD-Protonix   Presumed trigeminal neuralgia-continue oxcarbazepine  300 mg p.o. twice daily  Left occipital (PCA distribution) stroke-Daily ASA, Crestor  DVT prophylaxis: Lovenox      Code Status: Full Code  Consults called: None  Admission status: Observation  Time spent: 49 minutes  Mertis Mosher CHRISTELLA Gail MD [facility] Pager [phone]  If 7PM-7AM, please contact night-coverage [email or web address] Password [facility]  10/01/2023, 9:34 AM

## 2023-10-01 NOTE — Progress Notes (Signed)
   10/01/23 1123  Spiritual Encounters  Type of Visit Initial  Care provided to: Patient  Reason for visit Advance directives  OnCall Visit No   Responded to spiritual consult for HCPOA. Patient states her sister will be her agent. Patient's sister will visit later on today, at which time patient and sister will complete paperwork and return to chaplain.

## 2023-10-02 ENCOUNTER — Other Ambulatory Visit: Payer: Self-pay

## 2023-10-02 DIAGNOSIS — E876 Hypokalemia: Secondary | ICD-10-CM | POA: Diagnosis not present

## 2023-10-02 LAB — BASIC METABOLIC PANEL WITH GFR
Anion gap: 10 (ref 5–15)
Anion gap: 12 (ref 5–15)
BUN: <5 mg/dL — ABNORMAL LOW (ref 8–23)
BUN: <5 mg/dL — ABNORMAL LOW (ref 8–23)
CO2: 26 mmol/L (ref 22–32)
CO2: 25 mmol/L (ref 22–32)
Calcium: 8.6 mg/dL — ABNORMAL LOW (ref 8.9–10.3)
Calcium: 8.7 mg/dL — ABNORMAL LOW (ref 8.9–10.3)
Chloride: 103 mmol/L (ref 98–111)
Chloride: 101 mmol/L (ref 98–111)
Creatinine, Ser: 0.62 mg/dL (ref 0.44–1.00)
Creatinine, Ser: 0.43 mg/dL — ABNORMAL LOW (ref 0.44–1.00)
GFR, Estimated: >60 mL/min (ref >60)
GFR, Estimated: >60 mL/min (ref >60)
Glucose, Bld: 141 mg/dL — ABNORMAL HIGH (ref 70–99)
Glucose, Bld: 148 mg/dL — ABNORMAL HIGH (ref 70–99)
Potassium: 2.8 mmol/L — ABNORMAL LOW (ref 3.5–5.1)
Potassium: 2.9 mmol/L — ABNORMAL LOW (ref 3.5–5.1)
Sodium: 139 mmol/L (ref 135–145)
Sodium: 138 mmol/L (ref 135–145)

## 2023-10-02 LAB — CBC
HCT: 36.9 % (ref 36.0–46.0)
Hemoglobin: 12.2 g/dL (ref 12.0–15.0)
MCH: 30.4 pg (ref 26.0–34.0)
MCHC: 33.1 g/dL (ref 30.0–36.0)
MCV: 92 fL (ref 80.0–100.0)
Platelets: 305 K/uL (ref 150–400)
RBC: 4.01 MIL/uL (ref 3.87–5.11)
RDW: 15.4 % (ref 11.5–15.5)
WBC: 8.2 K/uL (ref 4.0–10.5)
nRBC: 0 % (ref 0.0–0.2)

## 2023-10-02 LAB — MAGNESIUM: Magnesium: 1.7 mg/dL (ref 1.7–2.4)

## 2023-10-02 LAB — HIV ANTIBODY (ROUTINE TESTING W REFLEX): HIV Screen 4th Generation wRfx: NONREACTIVE

## 2023-10-02 MED ORDER — HYDRALAZINE HCL 25 MG PO TABS
25.0000 mg | ORAL_TABLET | Freq: Three times a day (TID) | ORAL | Status: DC
  Administered 2023-10-02 (×2): 25 mg via ORAL
  Filled 2023-10-02 (×2): qty 1

## 2023-10-02 MED ORDER — LOSARTAN POTASSIUM 50 MG PO TABS
50.0000 mg | ORAL_TABLET | Freq: Every day | ORAL | Status: DC
  Administered 2023-10-02 – 2023-10-03 (×2): 50 mg via ORAL
  Filled 2023-10-02 (×3): qty 1

## 2023-10-02 MED ORDER — AMLODIPINE BESYLATE 10 MG PO TABS
10.0000 mg | ORAL_TABLET | Freq: Every day | ORAL | Status: DC
  Administered 2023-10-02 – 2023-10-08 (×6): 10 mg via ORAL
  Filled 2023-10-02 (×7): qty 1

## 2023-10-02 MED ORDER — HYDRALAZINE HCL 25 MG PO TABS
25.0000 mg | ORAL_TABLET | Freq: Once | ORAL | Status: AC
  Administered 2023-10-02: 25 mg via ORAL
  Filled 2023-10-02: qty 1

## 2023-10-02 MED ORDER — POTASSIUM CHLORIDE 20 MEQ PO PACK
40.0000 meq | PACK | Freq: Four times a day (QID) | ORAL | Status: AC
  Administered 2023-10-02 (×2): 40 meq via ORAL
  Filled 2023-10-02 (×2): qty 2

## 2023-10-02 MED ORDER — SODIUM CHLORIDE 0.9 % IV SOLN
12.5000 mg | Freq: Once | INTRAVENOUS | Status: AC
  Administered 2023-10-02: 12.5 mg via INTRAVENOUS
  Filled 2023-10-02: qty 12.5

## 2023-10-02 MED ORDER — HYDRALAZINE HCL 50 MG PO TABS
50.0000 mg | ORAL_TABLET | Freq: Three times a day (TID) | ORAL | Status: DC
  Administered 2023-10-02 – 2023-10-03 (×3): 50 mg via ORAL
  Filled 2023-10-02 (×4): qty 1

## 2023-10-02 MED ORDER — LIDOCAINE VISCOUS HCL 2 % MT SOLN
15.0000 mL | Freq: Once | OROMUCOSAL | Status: DC
  Filled 2023-10-02: qty 15

## 2023-10-02 MED ORDER — POLYVINYL ALCOHOL 1.4 % OP SOLN
1.0000 [drp] | OPHTHALMIC | Status: DC | PRN
  Administered 2023-10-04 – 2023-10-08 (×3): 1 [drp] via OPHTHALMIC
  Filled 2023-10-02 (×2): qty 15

## 2023-10-02 MED ORDER — HYDRALAZINE HCL 20 MG/ML IJ SOLN
10.0000 mg | Freq: Four times a day (QID) | INTRAMUSCULAR | Status: DC | PRN
  Administered 2023-10-05: 10 mg via INTRAVENOUS
  Filled 2023-10-02: qty 1

## 2023-10-02 NOTE — Progress Notes (Addendum)
 PROGRESS NOTE    Shawna Smith  FMW:[id number] DOB: 1955-11-16 DOA: 09/30/2023 PCP: Charlyne Helling, MD   Brief Narrative:  HPI: Shawna Smith is a 68 y.o. female with medical history significant for MS, presumed trigeminal neuralgia with uncontrolled pain, hypertension admitted to the hospital for hypomagnesemia and hypokalemia.  She is followed by neurology Dr. Charlyne at [facility], takes Aubagio  for her MS has left-sided facial pain presumptive trigeminal neuralgia had gamma knife surgery 08/10/2023 unfortunately without improvement.  Her neurologist has been uptitrating her oxcarbazepine  in an attempt to manage her pain.  She had follow-up appointment with her PCP on 7/30, lab work was drawn and patient was noted to have significant hypokalemia and hypomagnesemia, was called and told to come to the ER.  Lab work confirmed these electrolyte abnormalities as noted below, and patient was admitted for repletion.  Patient tells me that for the last couple of weeks, she has had watery nonbloody diarrhea up to 3 times a day.  Denies fevers, nausea, or abdominal pain.   Assessment & Plan:   Principal Problem:   Hypokalemia  Severe hypokalemia and hypomagnesemia/nausea-I suspect this is due to poor p.o. intake, as well as ongoing diarrhea of unclear etiology.  Magnesium  has normalized.  BMP this morning pending.  Reportedly patient has received 160 mEq of potassium since yesterday, recent potassium was 2.8 which was improved from 2.2 yesterday.  I was informed that patient was refusing to take p.o. potassium.  I personally spoke to the patient, counseled her and now she is willing to take liquid potassium.  Which has been ordered for her.  She is nauseous.  She received Zofran  earlier today.  I have ordered a dose of Phenergan  to be given before potassium oral to be given, spoke to the nurse in detail personally.  If she would not able to tolerate p.o. potassium, she will need to have midline or PICC line placed.   Monitor closely.  Diarrhea-patient without abdominal discomfort, bleeding, fevers or other evidence of complication.  Doubt infectious diarrhea.  She has had only 1 bowel movement since admission.  No reports of diarrhea anymore.   History of hypertension but now with hypertensive urgency-recent blood pressure 222/87, continue amlodipine , Toprol -XL.  It appears that patient was supposed to be on losartan , hydrochlorothiazide  as well as hydralazine  but she is not taking any of those.  I am starting her on losartan  50 mg and hydralazine  25 mg p.o. 3 times daily and continue as needed IV hydralazine .   GERD-Protonix    trigeminal neuralgia-complains of left eye pain but she further explains that she has been having that pain since about a year.  Continue oxcarbazepine  300 mg p.o. twice daily.  Artificial tears.   Left occipital (PCA distribution) stroke-Daily ASA, Crestor   Addendum 4:50 PM: Per nurse, patient was complaining of pain and uncontrolled with IV as needed Dilaudid  which is ordered every 3 hours and oral oxycodone  which is ordered every 4 hours as needed, RN requesting to increase frequency.  I went to see patient personally, her pain actually is better than the morning.  No need to increase frequency of the pain medication at the moment.  Also blood pressure is high, giving him amlodipine  10 mg now instead of tonight, added extra dose of 25 mg p.o. hydralazine , increased previous dose of 25 mg hydralazine  3 times daily to 50 mg 3 times daily.  Patient has received only 1 dose of IV as needed labetalol  the whole day.  She also has  IV as needed hydralazine  ordered.  DVT prophylaxis: enoxaparin  (LOVENOX ) injection 30 mg Start: 10/01/23 1030   Code Status: Full Code  Family Communication:  None present at bedside.  Plan of care discussed with patient in length and he/she verbalized understanding and agreed with it.  Status is: Inpatient Remains inpatient appropriate because: Significantly  elevated blood pressure.   Estimated body mass index is 16.52 kg/m as calculated from the following:   Height as of this encounter: 5' 3 (1.6 m).   Weight as of this encounter: 42.3 kg.    Nutritional Assessment: Body mass index is 16.52 kg/m.SABRA Seen by dietician.  I agree with the assessment and plan as outlined below: Nutrition Status:        . Skin Assessment: I have examined the patient's skin and I agree with the wound assessment as performed by the wound care RN as outlined below:    Consultants:  None  Procedures:  None  Antimicrobials:  Anti-infectives (From admission, onward)    None         Subjective: Seen and examined, her complaint was nausea as well as left eye pain which is chronic.  No other complaint.  Objective: Vitals:   10/02/23 0900 10/02/23 0956 10/02/23 1000 10/02/23 1011  BP: (!) 184/67 (!) 184/67 (!) 208/111 (!) 222/87  Pulse: (!) 58 77 68 72  Resp: 14  (!) 21 (!) 21  Temp:      TempSrc:      SpO2: 94%  94% 95%  Weight:      Height:        Intake/Output Summary (Last 24 hours) at 10/02/2023 1042 Last data filed at 10/02/2023 0800 Gross per 24 hour  Intake 1195.5 ml  Output 3075 ml  Net -1879.5 ml   Filed Weights   09/30/23 2108 10/01/23 0513  Weight: 41.3 kg 42.3 kg    Examination:  General exam: Appears nauseous and in pain. Respiratory system: Clear to auscultation. Respiratory effort normal. Cardiovascular system: S1 & S2 heard, RRR. No JVD, murmurs, rubs, gallops or clicks. No pedal edema. Gastrointestinal system: Abdomen is nondistended, soft and nontender. No organomegaly or masses felt. Normal bowel sounds heard. Central nervous system: Alert and oriented. No focal neurological deficits. Extremities: Symmetric 5 x 5 power. Skin: No rashes, lesions or ulcers Psychiatry: Judgement and insight appear normal. Mood & affect appropriate.    Data Reviewed: I have personally reviewed following labs and imaging  studies  CBC: Recent Labs  Lab 09/30/23 2128 10/02/23 0343  WBC 4.6 8.2  NEUTROABS 2.0  --   HGB 12.7 12.2  HCT 36.4 36.9  MCV 87.5 92.0  PLT 309 305   Basic Metabolic Panel: Recent Labs  Lab 09/30/23 2128 10/01/23 0105 10/01/23 0633 10/01/23 1533 10/02/23 0343 10/02/23 0409  NA 136 136  --  139 139  --   K <2.0* 2.2*  --  2.8* 2.8*  --   CL 94* 96*  --  105 103  --   CO2 25 27  --  23 26  --   GLUCOSE 149* 99  --  113* 141*  --   BUN 6* 7*  --  <5* <5*  --   CREATININE 0.79 0.68  --  0.73 0.62  --   CALCIUM  9.5 9.2  --  8.2* 8.6*  --   MG 1.2*  --  1.8 1.8  --  1.7  PHOS  --   --  2.8 2.9  --   --  GFR: Estimated Creatinine Clearance: 46.8 mL/min (by C-G formula based on SCr of 0.62 mg/dL). Liver Function Tests: No results for input(s): AST, ALT, ALKPHOS, BILITOT, PROT, ALBUMIN in the last 168 hours. No results for input(s): LIPASE, AMYLASE in the last 168 hours. No results for input(s): AMMONIA in the last 168 hours. Coagulation Profile: No results for input(s): INR, PROTIME in the last 168 hours. Cardiac Enzymes: No results for input(s): CKTOTAL, CKMB, CKMBINDEX, TROPONINI in the last 168 hours. BNP (last 3 results) No results for input(s): PROBNP in the last 8760 hours. HbA1C: No results for input(s): HGBA1C in the last 72 hours. CBG: No results for input(s): GLUCAP in the last 168 hours. Lipid Profile: No results for input(s): CHOL, HDL, LDLCALC, TRIG, CHOLHDL, LDLDIRECT in the last 72 hours. Thyroid Function Tests: No results for input(s): TSH, T4TOTAL, FREET4, T3FREE, THYROIDAB in the last 72 hours. Anemia Panel: No results for input(s): VITAMINB12, FOLATE, FERRITIN, TIBC, IRON, RETICCTPCT in the last 72 hours. Sepsis Labs: No results for input(s): PROCALCITON, LATICACIDVEN in the last 168 hours.  Recent Results (from the past 240 hours)  MRSA Next Gen by PCR, Nasal      Status: None   Collection Time: 10/01/23  4:54 AM   Specimen: Nasal Mucosa; Nasal Swab  Result Value Ref Range Status   MRSA by PCR Next Gen NOT DETECTED NOT DETECTED Final    Comment: (NOTE) The GeneXpert MRSA Assay (FDA approved for NASAL specimens only), is one component of a comprehensive MRSA colonization surveillance program. It is not intended to diagnose MRSA infection nor to guide or monitor treatment for MRSA infections. Test performance is not FDA approved in patients less than 71 years old. Performed at [facility], [address] [place], [place] [address]      Radiology Studies: US  EKG SITE RITE Result Date: 10/02/2023 If Site Rite image not attached, placement could not be confirmed due to current cardiac rhythm.   Scheduled Meds:  amLODipine   10 mg Oral QHS   aspirin  EC  81 mg Oral Daily   Chlorhexidine  Gluconate Cloth  6 each Topical Daily   enoxaparin  (LOVENOX ) injection  30 mg Subcutaneous Daily   hydrALAZINE   25 mg Oral TID   lidocaine   15 mL Mouth/Throat Once   losartan   50 mg Oral Daily   magic mouthwash  5 mL Oral TID   metoprolol  succinate  100 mg Oral Daily   Oxcarbazepine   300 mg Oral BID   pantoprazole   40 mg Oral Daily   pneumococcal 20-valent conjugate vaccine  0.5 mL Intramuscular Tomorrow-1000   potassium chloride   40 mEq Oral Q6H   rosuvastatin   5 mg Oral Daily   Teriflunomide   7 mg Oral QHS   Continuous Infusions:   LOS: 1 day   Fredia Skeeter, MD [facility]  10/02/2023, 10:42 AM   *Please note that this is a verbal dictation therefore any spelling or grammatical errors are due to the Dragon Medical One system interpretation.  Please page via Amion and do not message via secure chat for urgent patient care matters. Secure chat can be used for non urgent patient care matters.  How to contact the [facility] Attending or Consulting provider 7A - 7P or covering provider during after hours 7P -7A, for this patient?   Check the care team in [facility] and look for a) attending/consulting TR[facility] provider listed and b) the [facility] team listed. Page or secure chat 7A-7P. Log into [email or web address] and use [place]'s  universal password to access. If you do not have the password, please contact the hospital operator. Locate the [facility] provider you are looking for under [facility] and page to a number that you can be directly reached. If you still have difficulty reaching the provider, please page the [facility] (Director on Call) for the Hospitalists listed on amion for assistance.

## 2023-10-02 NOTE — TOC Initial Note (Signed)
 Transition of Care [facility]) - Initial/Assessment Note    Patient Details  Name: Shawna Smith MRN: [id number] Date of Birth: [birth year]  Transition of Care [facility]) CM/SW Contact:    [name], LCSW Phone Number: 10/02/2023, 1:22 PM  Clinical Narrative:                 Pt from home w/ family. Pt continues medical workup. TOC following for dc needs.    Barriers to Discharge: Continued Medical Work up   Patient Goals and CMS Choice Patient states their goals for this hospitalization and ongoing recovery are:: return home   Choice offered to / list presented to : NA      Expected Discharge Plan and Services In-house Referral: NA Discharge Planning Services: NA   Living arrangements for the past 2 months: Single Family Home                 DME Arranged: N/A DME Agency: NA       HH Arranged: NA HH Agency: NA        Prior Living Arrangements/Services Living arrangements for the past 2 months: Single Family Home Lives with:: Relatives Patient language and need for interpreter reviewed:: Yes Do you feel safe going back to the place where you live?: Yes      Need for Family Participation in Patient Care: Yes (Comment) Care giver support system in place?: Yes (comment)   Criminal Activity/Legal Involvement Pertinent to Current Situation/Hospitalization: No - Comment as needed  Activities of Daily Living   ADL Screening (condition at time of admission) Independently performs ADLs?: No Does the patient have a NEW difficulty with bathing/dressing/toileting/self-feeding that is expected to last >3 days?: No Does the patient have a NEW difficulty with getting in/out of bed, walking, or climbing stairs that is expected to last >3 days?: No Does the patient have a NEW difficulty with communication that is expected to last >3 days?: No Is the patient deaf or have difficulty hearing?: No Does the patient have difficulty seeing, even when wearing glasses/contacts?: No Does the  patient have difficulty concentrating, remembering, or making decisions?: No  Permission Sought/Granted                  Emotional Assessment Appearance:: Appears stated age Attitude/Demeanor/Rapport: Engaged Affect (typically observed): Accepting Orientation: : Oriented to Self, Oriented to Place, Oriented to  Time, Oriented to Situation Alcohol  / Substance Use: Not Applicable Psych Involvement: No (comment)  Admission diagnosis:  Hypokalemia [E87.6] Hypomagnesemia [E83.42] General weakness [R53.1] Patient Active Problem List   Diagnosis Date Noted   Malnutrition of moderate degree 12/23/2021   Hyponatremia 12/21/2021   Pancreatitis 12/20/2021   UTI (urinary tract infection) 10/24/2021   Complicated UTI (urinary tract infection) 10/22/2021   Alcoholism in remission (HCC) 09/30/2020   Protein-calorie malnutrition, severe 05/29/2020   Generalized weakness 05/28/2020   Acute respiratory failure with hypoxia (HCC) 05/28/2020   Multiple sclerosis (HCC) 05/28/2020   Hypokalemia 05/28/2020   Hypomagnesemia 05/28/2020   Tobacco use 05/28/2020   Hand fracture, left 06/18/2018   Metacarpal bone fracture 06/18/2018   History of pathologic fracture 05/18/2017   Vitamin D deficiency 05/18/2017   Charcot's joint of right foot 11/23/2016   Age-related osteoporosis without current pathological fracture 11/23/2016   Closed fracture of left shoulder 09/12/2016   Plantar fibromatosis 03/11/2016   Chronic ischemic left posterior cerebral artery (PCA) stroke 01/13/2016   Insomnia 01/13/2016   Tobacco dependence 01/13/2016   Trigeminal neuralgia 10/07/2015  Chronic airway obstruction (HCC) 08/06/2012   Hypertension, benign 08/06/2012   Hyperglyceridemia 08/06/2012   PCP:  Charlyne Helling, MD Pharmacy:   [facility] [phone] - [place], [place] - [address] [place] [address] Phone: [phone] Fax: [phone]  [id number] - [place], [place] - [address] AT [facility] [address] [place] [address] Phone: [phone] Fax: [phone]  [place] [facility] [address] [place] [address] Phone: [phone] Fax: [phone]     Social Drivers of Health (SDOH) Social History: SDOH Screenings   Food Insecurity: No Food Insecurity (10/01/2023)  Housing: Low Risk  (10/01/2023)  Transportation Needs: No Transportation Needs (10/01/2023)  Utilities: Not At Risk (10/01/2023)  Financial Resource Strain: Medium Risk (03/18/2018)   Received from [facility] visits prior to 05/02/2022.  Social Connections: Socially Isolated (10/01/2023)  Tobacco Use: High Risk (10/01/2023)   SDOH Interventions:     Readmission Risk Interventions    10/02/2023    1:19 PM  Readmission Risk Prevention Plan  Transportation Screening Complete  PCP or Specialist Appt within 5-7 Days Complete  Home Care Screening Complete  Medication Review (RN CM) Complete

## 2023-10-02 NOTE — Progress Notes (Signed)
 Secure chat with Norleen Fila RN- states PICC is not needed and order to be canceled.

## 2023-10-03 ENCOUNTER — Inpatient Hospital Stay (HOSPITAL_COMMUNITY)

## 2023-10-03 DIAGNOSIS — J449 Chronic obstructive pulmonary disease, unspecified: Secondary | ICD-10-CM | POA: Diagnosis not present

## 2023-10-03 DIAGNOSIS — Z87891 Personal history of nicotine dependence: Secondary | ICD-10-CM | POA: Diagnosis not present

## 2023-10-03 DIAGNOSIS — J9601 Acute respiratory failure with hypoxia: Secondary | ICD-10-CM

## 2023-10-03 DIAGNOSIS — J9811 Atelectasis: Secondary | ICD-10-CM

## 2023-10-03 DIAGNOSIS — I10 Essential (primary) hypertension: Secondary | ICD-10-CM

## 2023-10-03 DIAGNOSIS — K219 Gastro-esophageal reflux disease without esophagitis: Secondary | ICD-10-CM

## 2023-10-03 DIAGNOSIS — E876 Hypokalemia: Secondary | ICD-10-CM | POA: Diagnosis not present

## 2023-10-03 LAB — CBC WITH DIFFERENTIAL/PLATELET
Abs Immature Granulocytes: 0.06 K/uL (ref 0.00–0.07)
Basophils Absolute: 0 K/uL (ref 0.0–0.1)
Basophils Relative: 0 %
Eosinophils Absolute: 0 K/uL (ref 0.0–0.5)
Eosinophils Relative: 0 %
HCT: 33.6 % — ABNORMAL LOW (ref 36.0–46.0)
Hemoglobin: 11.1 g/dL — ABNORMAL LOW (ref 12.0–15.0)
Immature Granulocytes: 1 %
Lymphocytes Relative: 9 %
Lymphs Abs: 1 K/uL (ref 0.7–4.0)
MCH: 30.8 pg (ref 26.0–34.0)
MCHC: 33 g/dL (ref 30.0–36.0)
MCV: 93.3 fL (ref 80.0–100.0)
Monocytes Absolute: 1.1 K/uL — ABNORMAL HIGH (ref 0.1–1.0)
Monocytes Relative: 9 %
Neutro Abs: 9.2 K/uL — ABNORMAL HIGH (ref 1.7–7.7)
Neutrophils Relative %: 81 %
Platelets: 265 K/uL (ref 150–400)
RBC: 3.6 MIL/uL — ABNORMAL LOW (ref 3.87–5.11)
RDW: 15.7 % — ABNORMAL HIGH (ref 11.5–15.5)
WBC: 11.4 K/uL — ABNORMAL HIGH (ref 4.0–10.5)
nRBC: 0 % (ref 0.0–0.2)

## 2023-10-03 LAB — BASIC METABOLIC PANEL WITH GFR
Anion gap: 15 (ref 5–15)
BUN: <5 mg/dL — ABNORMAL LOW (ref 8–23)
CO2: 27 mmol/L (ref 22–32)
Calcium: 8.8 mg/dL — ABNORMAL LOW (ref 8.9–10.3)
Chloride: 99 mmol/L (ref 98–111)
Creatinine, Ser: 0.61 mg/dL (ref 0.44–1.00)
GFR, Estimated: >60 mL/min (ref >60)
Glucose, Bld: 114 mg/dL — ABNORMAL HIGH (ref 70–99)
Potassium: 2.5 mmol/L — CL (ref 3.5–5.1)
Sodium: 141 mmol/L (ref 135–145)

## 2023-10-03 LAB — MAGNESIUM: Magnesium: 1.6 mg/dL — ABNORMAL LOW (ref 1.7–2.4)

## 2023-10-03 LAB — PROCALCITONIN: Procalcitonin: <0.1 ng/mL

## 2023-10-03 LAB — POTASSIUM: Potassium: 2.9 mmol/L — ABNORMAL LOW (ref 3.5–5.1)

## 2023-10-03 LAB — BRAIN NATRIURETIC PEPTIDE: B Natriuretic Peptide: 726.3 pg/mL — ABNORMAL HIGH (ref 0.0–100.0)

## 2023-10-03 MED ORDER — IPRATROPIUM-ALBUTEROL 0.5-2.5 (3) MG/3ML IN SOLN
3.0000 mL | Freq: Four times a day (QID) | RESPIRATORY_TRACT | Status: DC
  Administered 2023-10-03 – 2023-10-08 (×19): 3 mL via RESPIRATORY_TRACT
  Filled 2023-10-03 (×7): qty 3
  Filled 2023-10-03: qty 42
  Filled 2023-10-03 (×11): qty 3

## 2023-10-03 MED ORDER — IPRATROPIUM-ALBUTEROL 0.5-2.5 (3) MG/3ML IN SOLN: 3.0000 mL | Freq: Four times a day (QID) | RESPIRATORY_TRACT | Status: DC

## 2023-10-03 MED ORDER — METHYLPREDNISOLONE SODIUM SUCC 125 MG IJ SOLR
125.0000 mg | Freq: Once | INTRAMUSCULAR | Status: AC
  Administered 2023-10-03: 125 mg via INTRAVENOUS
  Filled 2023-10-03: qty 2

## 2023-10-03 MED ORDER — SODIUM CHLORIDE 0.9 % IV SOLN
1.0000 g | INTRAVENOUS | Status: DC
  Administered 2023-10-03 – 2023-10-08 (×6): 1 g via INTRAVENOUS
  Filled 2023-10-03 (×6): qty 10

## 2023-10-03 MED ORDER — POTASSIUM CHLORIDE 20 MEQ PO PACK
40.0000 meq | PACK | Freq: Four times a day (QID) | ORAL | Status: AC
  Administered 2023-10-03 (×2): 40 meq via ORAL
  Filled 2023-10-03 (×2): qty 2

## 2023-10-03 MED ORDER — TRAMADOL HCL 50 MG PO TABS
50.0000 mg | ORAL_TABLET | Freq: Four times a day (QID) | ORAL | Status: DC | PRN
  Administered 2023-10-03 – 2023-10-08 (×10): 50 mg via ORAL
  Filled 2023-10-03 (×10): qty 1

## 2023-10-03 MED ORDER — FUROSEMIDE 10 MG/ML IJ SOLN: 40.0000 mg | Freq: Two times a day (BID) | INTRAMUSCULAR | Status: DC

## 2023-10-03 MED ORDER — FUROSEMIDE 10 MG/ML IJ SOLN
40.0000 mg | Freq: Once | INTRAMUSCULAR | Status: AC
  Administered 2023-10-03: 40 mg via INTRAVENOUS
  Filled 2023-10-03: qty 4

## 2023-10-03 MED ORDER — SODIUM CHLORIDE 0.9 % IV SOLN
500.0000 mg | INTRAVENOUS | Status: DC
  Administered 2023-10-03 – 2023-10-04 (×2): 500 mg via INTRAVENOUS
  Filled 2023-10-03 (×2): qty 5

## 2023-10-03 MED ORDER — SODIUM CHLORIDE 0.9% FLUSH: 10.0000 mL | INTRAVENOUS | Status: DC | PRN

## 2023-10-03 MED ORDER — LEVOFLOXACIN IN D5W 750 MG/150ML IV SOLN: 750.0000 mg | INTRAVENOUS | Status: DC

## 2023-10-03 MED ORDER — MAGNESIUM SULFATE 4 GM/100ML IV SOLN
4.0000 g | Freq: Once | INTRAVENOUS | Status: AC
  Administered 2023-10-03: 4 g via INTRAVENOUS
  Filled 2023-10-03: qty 100

## 2023-10-03 MED ORDER — POTASSIUM CHLORIDE 10 MEQ/100ML IV SOLN
10.0000 meq | INTRAVENOUS | Status: AC
  Administered 2023-10-03 (×4): 10 meq via INTRAVENOUS
  Filled 2023-10-03 (×4): qty 100

## 2023-10-03 MED ORDER — HYDROMORPHONE HCL 1 MG/ML IJ SOLN
0.5000 mg | Freq: Once | INTRAMUSCULAR | Status: AC | PRN
  Administered 2023-10-03: 0.5 mg via INTRAVENOUS
  Filled 2023-10-03: qty 1

## 2023-10-03 MED ORDER — OXYCODONE HCL 5 MG PO TABS
5.0000 mg | ORAL_TABLET | ORAL | Status: DC | PRN
  Administered 2023-10-05 – 2023-10-08 (×9): 5 mg via ORAL
  Filled 2023-10-03 (×11): qty 1

## 2023-10-03 MED ORDER — POTASSIUM CHLORIDE 20 MEQ PO PACK
40.0000 meq | PACK | ORAL | Status: AC
  Administered 2023-10-03: 40 meq via ORAL
  Filled 2023-10-03: qty 2

## 2023-10-03 NOTE — Progress Notes (Signed)
 Pt was placed on HHFNC due to low sats.

## 2023-10-03 NOTE — Consult Note (Signed)
 NAME:  Shawna Smith, MRN:  [id number], DOB:  10-09-1955, LOS: 2 ADMISSION DATE:  09/30/2023, CONSULTATION DATE: 10/03/2023 REFERRING MD: Dr. Vernon, CHIEF COMPLAINT: Hypoxemia  History of Present Illness:  68 year old woman with MS on Aubagio  ([facility]), debilitating left trigeminal neuralgia that failed gamma knife surgery 08/10/2023, chronic pain for which she has uptitrated oxcarbazepine , requires narcotics.  Admitted initially for severe hypokalemia and hypomagnesemia.  Pain control has been difficult, has received Dilaudid  IV, oxycodone  p.o., tramadol .  She has developed an oxygen requirement over the last 48 hours.  This has been uptitrated, now on 15 L/min.  BiPAP was considered overnight 8/2-8/3 but she could not tolerate it due to left facial pain.  Chest x-ray 8/3 confirmed some left lower lobe atelectasis, significant right middle lobe atelectasis with partial collapse, no other real infiltrates. Temp this am 100.53F.   Pertinent  Medical History   Past Medical History:  Diagnosis Date   Hypertension    MS (multiple sclerosis) (HCC)    Osteoporosis   Left trigeminal neuralgia  Significant Hospital Events: Including procedures, antibiotic start and stop dates in addition to other pertinent events     Interim History / Subjective:  Still with some left facial pain, a bit better but is dependent on her analgesics  Objective    Blood pressure (!) 188/81, pulse 77, temperature 99.8 F (37.7 C), temperature source Axillary, resp. rate 14, height 5' 3 (1.6 m), weight 42.3 kg, SpO2 97%.        Intake/Output Summary (Last 24 hours) at 10/03/2023 1137 Last data filed at 10/02/2023 1800 Gross per 24 hour  Intake 50 ml  Output 850 ml  Net -800 ml   Filed Weights   09/30/23 2108 10/01/23 0513  Weight: 41.3 kg 42.3 kg    Examination: General: Thin woman, somewhat stoic, uncomfortable HENT: Left facial pain.  Difficulty opening her mouth widely, left ptosis.  Strong cough when  prompted with good clearance of scant mucus Lungs: Significantly decreased right base with some inspiratory crackles, clear superiorly.  The left lung is mostly clear.  No wheezing Cardiovascular: Regular, no murmur Abdomen: Nondistended with positive bowel sounds Extremities: No edema Neuro: She is awake, moves extremities with good strength.  Nods to questions.  Does answer questions appropriately but soft voice.  She will take a deep inspiration when prompted.  Strong cough when prompted GU: Deferred  Resolved problem list   Assessment and Plan   -Acute hypoxemic respiratory failure -Acute hypoventilation, likely component narcotics for discomfort -Left lower lobe and right middle lobe atelectasis with partial right middle lobe collapse -Presumed restrictive lung disease due to MS and respiratory muscle weakness, ? How active this issue is currently -History of tobacco use (quit 5 months ago) and presumed COPD, no PFTs available -Question OSA, underwent a split-night sleep study 03/31/2022 at [facility], unknown results -Left trigeminal neuralgia with severe pain - Hypertension - GERD -History left occipital PCA CVA  Recommendations - Need to aggressively push pulmonary hygiene.  She needs incentive spirometry, mobilization up to chair.  Her secretion burden is not high so I do not think she necessarily needs chest percussion, hypertonic nebs, etc. - Check NIF, FVC daily.  Unclear to me how much chronic respiratory muscle weakness may be at play here.  She is on maintenance therapy for her MS.  She had a strong cough when prompted, takes a deep breath when prompted - Agree with minimizing narcotics which are clearly one of the precipitants of her hypoventilation  and hypoxemia. -Focal right middle lobe atelectasis/infiltrate on chest x-ray.  Agree with empiric ceftriaxone  and azithromycin . - Wean oxygen as available.  Consider changing to heated high flow on 8/3 to get mask off her face and  facilitate p.o. - Okay to try BiPAP to enhance basilar aeration if she can tolerate, but her facial pain prohibited at when attempted last night - I did advise her that we are trying to strike a balance between her pain control and her hypoxemia.  Counseled her that she is at some risk for escalation of care, intubation, mechanical ventilation if she is unable to work on the strategies above.  Best Practice (right click and Reselect all SmartList Selections daily)   Diet/type: Regular consistency (see orders) DVT prophylaxis LMWH Pressure ulcer(s): N/A GI prophylaxis: PPI Lines: Central line >> midline Foley:  N/A Code Status:  full code Last date of multidisciplinary goals of care discussion [pending]  Labs   CBC: Recent Labs  Lab 09/30/23 2128 10/02/23 0343 10/03/23 0758  WBC 4.6 8.2 11.4*  NEUTROABS 2.0  --  9.2*  HGB 12.7 12.2 11.1*  HCT 36.4 36.9 33.6*  MCV 87.5 92.0 93.3  PLT 309 305 265    Basic Metabolic Panel: Recent Labs  Lab 09/30/23 2128 10/01/23 0105 10/01/23 0633 10/01/23 1533 10/02/23 0343 10/02/23 0409 10/02/23 0954 10/03/23 0758  NA 136 136  --  139 139  --  138 141  K <2.0* 2.2*  --  2.8* 2.8*  --  2.9* 2.5*  CL 94* 96*  --  105 103  --  101 99  CO2 25 27  --  23 26  --  25 27  GLUCOSE 149* 99  --  113* 141*  --  148* 114*  BUN 6* 7*  --  <5* <5*  --  <5* <5*  CREATININE 0.79 0.68  --  0.73 0.62  --  0.43* 0.61  CALCIUM  9.5 9.2  --  8.2* 8.6*  --  8.7* 8.8*  MG 1.2*  --  1.8 1.8  --  1.7  --  1.6*  PHOS  --   --  2.8 2.9  --   --   --   --    GFR: Estimated Creatinine Clearance: 46.8 mL/min (by C-G formula based on SCr of 0.61 mg/dL). Recent Labs  Lab 09/30/23 2128 10/02/23 0343 10/03/23 0758 10/03/23 1021  PROCALCITON  --   --   --  <0.10  WBC 4.6 8.2 11.4*  --     Liver Function Tests: No results for input(s): AST, ALT, ALKPHOS, BILITOT, PROT, ALBUMIN in the last 168 hours. No results for input(s): LIPASE, AMYLASE  in the last 168 hours. No results for input(s): AMMONIA in the last 168 hours.  ABG    Component Value Date/Time   HCO3 30.8 (H) 05/28/2020 1720   O2SAT 26.7 05/28/2020 1720     Coagulation Profile: No results for input(s): INR, PROTIME in the last 168 hours.  Cardiac Enzymes: No results for input(s): CKTOTAL, CKMB, CKMBINDEX, TROPONINI in the last 168 hours.  HbA1C: No results found for: HGBA1C  CBG: No results for input(s): GLUCAP in the last 168 hours.  Review of Systems:   C/o L facial pain  Past Medical History:  She,  has a past medical history of Hypertension, MS (multiple sclerosis) (HCC), and Osteoporosis.   Surgical History:   Past Surgical History:  Procedure Laterality Date   OPEN REDUCTION INTERNAL FIXATION (ORIF) METACARPAL Left 06/18/2018  Procedure: OPEN REDUCTION INTERNAL FIXATION (ORIF) LEFT HAND, MUTIPLE LEFT METACARPAL FRACTURES, AND OPEN REDUCTION OF MCP JOINT OF LEFT INDEX FINGER;  Surgeon: Camella Fallow, MD;  Location: [facility] OR;  Service: Orthopedics;  Laterality: Left;     Social History:   reports that she has been smoking cigarettes. She has never used smokeless tobacco. She reports current alcohol  use. She reports that she does not use drugs.   Family History:  Her family history is not on file.   Allergies Allergies  Allergen Reactions   Ace Inhibitors Itching, Swelling and Other (See Comments)    Site of swelling not noted   Lisinopril Anaphylaxis, Swelling and Other (See Comments)    Tongue swells   Penicillins Shortness Of Breath, Itching and Swelling    Tolerates Cephalosporins   Amoxicillin Itching and Swelling   Sulfa Antibiotics Itching and Swelling   Tape Itching and Other (See Comments)    CAUSES EXTREME ITCHING     Home Medications  Prior to Admission medications   Medication Sig Start Date End Date Taking? Authorizing Provider  acetaminophen  (TYLENOL ) 500 MG tablet Take 500 mg by mouth every 4 (four)  hours as needed (pain).   Yes [provider]  amLODipine  (NORVASC ) 10 MG tablet Take 10 mg by mouth at bedtime. 05/02/20  Yes [provider]  aspirin  81 MG EC tablet Take 81 mg by mouth daily. 04/25/15  Yes [provider]  EPINEPHrine  0.3 mg/0.3 mL IJ SOAJ injection Inject 0.3 mg into the muscle once as needed for anaphylaxis. 12/23/21  Yes Ghimire, Kuber, MD  metoprolol  succinate (TOPROL -XL) 100 MG 24 hr tablet Take 100 mg by mouth daily. 03/05/20  Yes [provider]  Oxcarbazepine  (TRILEPTAL ) 300 MG tablet Take 300 mg by mouth 2 (two) times daily. 09/29/23  Yes [provider]  oxyCODONE  (ROXICODONE ) 5 MG immediate release tablet Take 1 tablet (5 mg total) by mouth every 4 (four) hours as needed for severe pain (pain score 7-10). 04/05/23  Yes Dreama Longs, MD  pantoprazole  (PROTONIX ) 40 MG tablet Take 40 mg by mouth daily. 06/29/23  Yes [provider]  rosuvastatin  (CRESTOR ) 5 MG tablet Take 5 mg by mouth daily. 07/23/23  Yes [provider]  Teriflunomide  7 MG TABS Take 7 mg by mouth at bedtime. 03/30/22  Yes [provider]  traZODone  (DESYREL ) 50 MG tablet Take 100 mg by mouth at bedtime as needed for sleep.   Yes [provider]  albuterol  (VENTOLIN  HFA) 108 (90 Base) MCG/ACT inhaler Inhale 2 puffs into the lungs 3 (three) times daily as needed for wheezing or shortness of breath. 12/23/21 10/01/23  Ghimire, Kuber, MD  amitriptyline (ELAVIL) 50 MG tablet Take 50 mg by mouth at bedtime. Patient not taking: Reported on 10/01/2023 03/13/19   [provider]  Ensure Plus (ENSURE PLUS) LIQD Take 237 mLs by mouth 3 (three) times daily between meals. Patient not taking: Reported on 10/01/2023    [provider]  ergocalciferol (VITAMIN D2) 1.25 MG (50000 UT) capsule Take 50,000 Units by mouth every Thursday. Patient not taking: Reported on 10/01/2023    [provider]  hydrALAZINE  (APRESOLINE ) 50 MG  tablet Take 50 mg by mouth 2 (two) times daily. Patient not taking: Reported on 10/01/2023    [provider]  losartan -hydrochlorothiazide  (HYZAAR) 50-12.5 MG tablet Take 1 tablet by mouth daily. Patient not taking: Reported on 10/01/2023 03/05/20   [provider]  Omega-3 Fatty Acids (FISH OIL) 1000 MG  CAPS Take 1,000 mg by mouth daily. Patient not taking: Reported on 10/01/2023    [provider]  OXcarbazepine  (TRILEPTAL ) 150 MG tablet Take 150 mg by mouth in the morning and at bedtime. Patient not taking: Reported on 10/01/2023 04/08/20   [provider]  potassium chloride  SA (KLOR-CON  M) 20 MEQ tablet Take 20 mEq by mouth daily. Patient not taking: Reported on 10/01/2023    [provider]  tamsulosin (FLOMAX) 0.4 MG CAPS capsule Take 0.4 mg by mouth daily after breakfast. Patient not taking: Reported on 10/01/2023    [provider]  traMADol  (ULTRAM -ER) 100 MG 24 hr tablet Take 100 mg by mouth in the morning. Patient not taking: Reported on 10/01/2023    [provider]     Critical care time: NA     Lamar Chris, MD, PhD 10/03/2023, 11:37 AM  Pulmonary and Critical Care [phone] or if no answer before 7:00PM call [phone] For any issues after 7:00PM please call eLink [phone]

## 2023-10-03 NOTE — Progress Notes (Signed)
 PROGRESS NOTE    Shawna Smith  FMW:[id number] DOB: [birth year] DOA: 09/30/2023 PCP: Charlyne Helling, MD   Brief Narrative:  HPI: Shawna Smith is a 68 y.o. female with medical history significant for MS, presumed trigeminal neuralgia with uncontrolled pain, hypertension admitted to the hospital for hypomagnesemia and hypokalemia.  She is followed by neurology Dr. Charlyne at [facility], takes Aubagio  for her MS has left-sided facial pain presumptive trigeminal neuralgia had gamma knife surgery 08/10/2023 unfortunately without improvement.  Her neurologist has been uptitrating her oxcarbazepine  in an attempt to manage her pain.  She had follow-up appointment with her PCP on 7/30, lab work was drawn and patient was noted to have significant hypokalemia and hypomagnesemia, was called and told to come to the ER.  Lab work confirmed these electrolyte abnormalities as noted below, and patient was admitted for repletion.  Patient tells me that for the last couple of weeks, she has had watery nonbloody diarrhea up to 3 times a day.  Denies fevers, nausea, or abdominal pain.   Assessment & Plan:   Principal Problem:   Hypokalemia  Severe hypokalemia and hypomagnesemia/nausea-despite receiving several rounds of potassium, potassium remains low.  Magnesium  is also low today.  Will aggressively replenish both of them.  She is not complaining of nausea anymore.   Acute hypoxic respiratory failure secondary to community-acquired ammonia: Starting late afternoon 10/02/2023, patient was noted to require oxygen.  Overnight, she had to be placed on 15 L of oxygen with nonrebreather.  Patient clinically appears to be comfortable but has coarse breath sounds with rhonchi bilaterally.  Chest x-ray shows possibility of bilateral pneumonia.  I also think that she may have some component of vascular congestion.  Starting this patient on Rocephin , Zithromax  and I have ordered BNP, procalcitonin as well as Lasix .  We will have to reduce her  opioid consumption to avoid respiratory depression.  Will discontinue Dilaudid .  Ordered tramadol .  She is already on oxycodone .  Nurse has been made very well aware of the situation.  Unfortunately, we cannot compromise her respiratory status just to control her pain which is chronic.  Acute COPD exacerbation/past smoker/passive smoking: Patient used to be a smoker, she says that she quitted 5 months ago.  She further tells me that she has a diagnosis of COPD however I could not find any records of that.  Despite of her stopping smoking 5 months ago, she has exposure to passive smoking constantly.  She does have very coarse breath sounds, may have a component of acute COPD exacerbation.  I am going to give her 1 dose of IV Solu-Medrol  and start her on DuoNebs.  Diarrhea-patient without abdominal discomfort, bleeding, fevers or other evidence of complication.  Doubt infectious diarrhea.  She has had only 1 bowel movement since admission.  No reports of diarrhea anymore.   History of hypertension but now with hypertensive urgency-blood pressure much improved today.  Continue amlodipine , Toprol -XL, losartan  and hydralazine .     GERD-Protonix    trigeminal neuralgia-complains of left eye pain but she further explains that she has been having that pain since about a year.  Continue oxcarbazepine  300 mg p.o. twice daily.  Artificial tears.  Continue pain medications as above.   Left occipital (PCA distribution) stroke-Daily ASA, Crestor   DVT prophylaxis: enoxaparin  (LOVENOX ) injection 30 mg Start: 10/01/23 1030   Code Status: Full Code  Family Communication:  None present at bedside.  Plan of care discussed with patient in length and he/she verbalized understanding and agreed with  it.  Status is: Inpatient Remains inpatient appropriate because: Hypoxic   Estimated body mass index is 16.52 kg/m as calculated from the following:   Height as of this encounter: 5' 3 (1.6 m).   Weight as of this  encounter: 42.3 kg.    Nutritional Assessment: Body mass index is 16.52 kg/m.SABRA Seen by dietician.  I agree with the assessment and plan as outlined below: Nutrition Status:        . Skin Assessment: I have examined the patient's skin and I agree with the wound assessment as performed by the wound care RN as outlined below:    Consultants:  None  Procedures:  None  Antimicrobials:  Anti-infectives (From admission, onward)    Start     Dose/Rate Route Frequency Ordered Stop   10/03/23 1030  levofloxacin  (LEVAQUIN ) IVPB 750 mg        750 mg 100 mL/hr over 90 Minutes Intravenous Every 24 hours 10/03/23 0938     10/03/23 1030  azithromycin  (ZITHROMAX ) 500 mg in sodium chloride  0.9 % 250 mL IVPB        500 mg 250 mL/hr over 60 Minutes Intravenous Every 24 hours 10/03/23 0938           Subjective: Seen and examined, despite of being in nonrebreather, patient appeared very comfortable.  She did complain of shortness of breath.  No other complaint.  RN was at the bedside during my visit.  Objective: Vitals:   10/03/23 0417 10/03/23 0500 10/03/23 0600 10/03/23 0800  BP:  (!) 180/90 (!) 153/63   Pulse: 74 79 63   Resp: 15 19 14    Temp:    99.8 F (37.7 C)  TempSrc:    Axillary  SpO2: 100% 92% 97%   Weight:      Height:        Intake/Output Summary (Last 24 hours) at 10/03/2023 0939 Last data filed at 10/02/2023 1800 Gross per 24 hour  Intake 50 ml  Output 850 ml  Net -800 ml   Filed Weights   09/30/23 2108 10/01/23 0513  Weight: 41.3 kg 42.3 kg    Examination:  General exam: Appears calm and comfortable, appears chronically sick and cachectic Respiratory system: Coarse but diminished breath sounds with rhonchi bilaterally. Respiratory effort normal on nonrebreather. Cardiovascular system: S1 & S2 heard, RRR. No JVD, murmurs, rubs, gallops or clicks. No pedal edema. Gastrointestinal system: Abdomen is nondistended, soft and nontender. No organomegaly or masses  felt. Normal bowel sounds heard. Central nervous system: Alert and oriented. No focal neurological deficits. Extremities: Symmetric 5 x 5 power. Skin: No rashes, lesions or ulcers.  Psychiatry: Judgement and insight appear normal. Mood & affect appropriate.   Data Reviewed: I have personally reviewed following labs and imaging studies  CBC: Recent Labs  Lab 09/30/23 2128 10/02/23 0343 10/03/23 0758  WBC 4.6 8.2 11.4*  NEUTROABS 2.0  --  9.2*  HGB 12.7 12.2 11.1*  HCT 36.4 36.9 33.6*  MCV 87.5 92.0 93.3  PLT 309 305 265   Basic Metabolic Panel: Recent Labs  Lab 09/30/23 2128 10/01/23 0105 10/01/23 0633 10/01/23 1533 10/02/23 0343 10/02/23 0409 10/02/23 0954 10/03/23 0758  NA 136 136  --  139 139  --  138 141  K <2.0* 2.2*  --  2.8* 2.8*  --  2.9* 2.5*  CL 94* 96*  --  105 103  --  101 99  CO2 25 27  --  23 26  --  25 27  GLUCOSE 149* 99  --  113* 141*  --  148* 114*  BUN 6* 7*  --  <5* <5*  --  <5* <5*  CREATININE 0.79 0.68  --  0.73 0.62  --  0.43* 0.61  CALCIUM  9.5 9.2  --  8.2* 8.6*  --  8.7* 8.8*  MG 1.2*  --  1.8 1.8  --  1.7  --  1.6*  PHOS  --   --  2.8 2.9  --   --   --   --    GFR: Estimated Creatinine Clearance: 46.8 mL/min (by C-G formula based on SCr of 0.61 mg/dL). Liver Function Tests: No results for input(s): AST, ALT, ALKPHOS, BILITOT, PROT, ALBUMIN in the last 168 hours. No results for input(s): LIPASE, AMYLASE in the last 168 hours. No results for input(s): AMMONIA in the last 168 hours. Coagulation Profile: No results for input(s): INR, PROTIME in the last 168 hours. Cardiac Enzymes: No results for input(s): CKTOTAL, CKMB, CKMBINDEX, TROPONINI in the last 168 hours. BNP (last 3 results) No results for input(s): PROBNP in the last 8760 hours. HbA1C: No results for input(s): HGBA1C in the last 72 hours. CBG: No results for input(s): GLUCAP in the last 168 hours. Lipid Profile: No results for input(s):  CHOL, HDL, LDLCALC, TRIG, CHOLHDL, LDLDIRECT in the last 72 hours. Thyroid Function Tests: No results for input(s): TSH, T4TOTAL, FREET4, T3FREE, THYROIDAB in the last 72 hours. Anemia Panel: No results for input(s): VITAMINB12, FOLATE, FERRITIN, TIBC, IRON, RETICCTPCT in the last 72 hours. Sepsis Labs: No results for input(s): PROCALCITON, LATICACIDVEN in the last 168 hours.  Recent Results (from the past 240 hours)  MRSA Next Gen by PCR, Nasal     Status: None   Collection Time: 10/01/23  4:54 AM   Specimen: Nasal Mucosa; Nasal Swab  Result Value Ref Range Status   MRSA by PCR Next Gen NOT DETECTED NOT DETECTED Final    Comment: (NOTE) The GeneXpert MRSA Assay (FDA approved for NASAL specimens only), is one component of a comprehensive MRSA colonization surveillance program. It is not intended to diagnose MRSA infection nor to guide or monitor treatment for MRSA infections. Test performance is not FDA approved in patients less than 64 years old. Performed at [facility], [address], [place], [place] [address]      Radiology Studies: DG CHEST PORT 1 VIEW Result Date: 10/03/2023 EXAM: 1 VIEW XRAY OF THE CHEST 10/03/2023 08:30:00 AM COMPARISON: 04/28/2023 CLINICAL HISTORY: Acute on chronic respiratory failure with hypoxia. Per chart pt has had watery nonbloody diarrhea up to 3 times a day for the last couple of weeks. FINDINGS: LUNGS AND PLEURA: New airspace opacities at both lung bases with possible associated small pleural effusions. HEART AND MEDIASTINUM: Atheromatous aorta. BONES AND SOFT TISSUES: No acute osseous abnormality. IMPRESSION: 1. New airspace opacities at both lung bases with possible associated small pleural effusions, compared to 04/28/2023. 2. Atheromatous aorta. Electronically signed by: Katheleen Faes MD 10/03/2023 09:00 AM EDT RP Workstation: [id number]   US  EKG SITE RITE Result Date: 10/02/2023 If Site Rite  image not attached, placement could not be confirmed due to current cardiac rhythm.   Scheduled Meds:  amLODipine   10 mg Oral Daily   aspirin  EC  81 mg Oral Daily   Chlorhexidine  Gluconate Cloth  6 each Topical Daily   enoxaparin  (LOVENOX ) injection  30 mg Subcutaneous Daily   furosemide   40 mg Intravenous BID   hydrALAZINE   50 mg  Oral TID   lidocaine   15 mL Mouth/Throat Once   losartan   50 mg Oral Daily   magic mouthwash  5 mL Oral TID   methylPREDNISolone  (SOLU-MEDROL ) injection  125 mg Intravenous Once   metoprolol  succinate  100 mg Oral Daily   Oxcarbazepine   300 mg Oral BID   pantoprazole   40 mg Oral Daily   pneumococcal 20-valent conjugate vaccine  0.5 mL Intramuscular Tomorrow-1000   rosuvastatin   5 mg Oral Daily   Teriflunomide   7 mg Oral QHS   Continuous Infusions:  azithromycin      levofloxacin  (LEVAQUIN ) IV       LOS: 2 days   Fredia Skeeter, MD [facility]  10/03/2023, 9:39 AM   *Please note that this is a verbal dictation therefore any spelling or grammatical errors are due to the Dragon Medical One system interpretation.  Please page via Amion and do not message via secure chat for urgent patient care matters. Secure chat can be used for non urgent patient care matters.  How to contact the [facility] Attending or Consulting provider 7A - 7P or covering provider during after hours 7P -7A, for this patient?  Check the care team in [facility] and look for a) attending/consulting [facility] provider listed and b) the [facility] team listed. Page or secure chat 7A-7P. Log into [email or web address] and use [facility] universal password to access. If you do not have the password, please contact the hospital operator. Locate the [facility] provider you are looking for under [facility] and page to a number that you can be directly reached. If you still have difficulty reaching the provider, please page the [facility] (Director on Call) for the Hospitalists listed on amion for assistance.

## 2023-10-04 ENCOUNTER — Inpatient Hospital Stay (HOSPITAL_COMMUNITY)

## 2023-10-04 DIAGNOSIS — J9601 Acute respiratory failure with hypoxia: Secondary | ICD-10-CM | POA: Diagnosis not present

## 2023-10-04 DIAGNOSIS — Z87891 Personal history of nicotine dependence: Secondary | ICD-10-CM | POA: Diagnosis not present

## 2023-10-04 DIAGNOSIS — I10 Essential (primary) hypertension: Secondary | ICD-10-CM | POA: Diagnosis not present

## 2023-10-04 DIAGNOSIS — E876 Hypokalemia: Secondary | ICD-10-CM | POA: Diagnosis not present

## 2023-10-04 LAB — BASIC METABOLIC PANEL WITH GFR
Anion gap: 12 (ref 5–15)
BUN: 7 mg/dL — ABNORMAL LOW (ref 8–23)
CO2: 26 mmol/L (ref 22–32)
Calcium: 8.8 mg/dL — ABNORMAL LOW (ref 8.9–10.3)
Chloride: 97 mmol/L — ABNORMAL LOW (ref 98–111)
Creatinine, Ser: 0.48 mg/dL (ref 0.44–1.00)
GFR, Estimated: >60 mL/min (ref >60)
Glucose, Bld: 122 mg/dL — ABNORMAL HIGH (ref 70–99)
Potassium: 2.9 mmol/L — ABNORMAL LOW (ref 3.5–5.1)
Sodium: 135 mmol/L (ref 135–145)

## 2023-10-04 LAB — EXPECTORATED SPUTUM ASSESSMENT W GRAM STAIN, RFLX TO RESP C

## 2023-10-04 LAB — CBC WITH DIFFERENTIAL/PLATELET
Abs Immature Granulocytes: 0.1 K/uL — ABNORMAL HIGH (ref 0.00–0.07)
Basophils Absolute: 0 K/uL (ref 0.0–0.1)
Basophils Relative: 0 %
Eosinophils Absolute: 0 K/uL (ref 0.0–0.5)
Eosinophils Relative: 0 %
HCT: 31.3 % — ABNORMAL LOW (ref 36.0–46.0)
Hemoglobin: 10.3 g/dL — ABNORMAL LOW (ref 12.0–15.0)
Immature Granulocytes: 1 %
Lymphocytes Relative: 6 %
Lymphs Abs: 0.8 K/uL (ref 0.7–4.0)
MCH: 30.7 pg (ref 26.0–34.0)
MCHC: 32.9 g/dL (ref 30.0–36.0)
MCV: 93.2 fL (ref 80.0–100.0)
Monocytes Absolute: 0.9 K/uL (ref 0.1–1.0)
Monocytes Relative: 6 %
Neutro Abs: 12.3 K/uL — ABNORMAL HIGH (ref 1.7–7.7)
Neutrophils Relative %: 87 %
Platelets: 213 K/uL (ref 150–400)
RBC: 3.36 MIL/uL — ABNORMAL LOW (ref 3.87–5.11)
RDW: 16 % — ABNORMAL HIGH (ref 11.5–15.5)
WBC: 14.1 K/uL — ABNORMAL HIGH (ref 4.0–10.5)
nRBC: 0 % (ref 0.0–0.2)

## 2023-10-04 LAB — MAGNESIUM: Magnesium: 1.9 mg/dL (ref 1.7–2.4)

## 2023-10-04 MED ORDER — BISACODYL 10 MG RE SUPP
10.0000 mg | Freq: Once | RECTAL | Status: AC
  Administered 2023-10-04: 10 mg via RECTAL
  Filled 2023-10-04: qty 1

## 2023-10-04 MED ORDER — ORAL CARE MOUTH RINSE
15.0000 mL | OROMUCOSAL | Status: DC | PRN
Start: 2023-10-04 — End: 2023-10-08

## 2023-10-04 MED ORDER — POTASSIUM CHLORIDE 20 MEQ PO PACK
40.0000 meq | PACK | ORAL | Status: AC
  Administered 2023-10-04 (×4): 40 meq via ORAL
  Filled 2023-10-04 (×4): qty 2

## 2023-10-04 MED ORDER — AZITHROMYCIN 250 MG PO TABS
500.0000 mg | ORAL_TABLET | Freq: Every day | ORAL | Status: AC
  Administered 2023-10-05 – 2023-10-07 (×3): 500 mg via ORAL
  Filled 2023-10-04 (×3): qty 2

## 2023-10-04 NOTE — Plan of Care (Signed)
  Problem: Education: Goal: Knowledge of General Education information will improve Description: Including pain rating scale, medication(s)/side effects and non-pharmacologic comfort measures Outcome: Progressing   Problem: Clinical Measurements: Goal: Ability to maintain clinical measurements within normal limits will improve Outcome: Progressing   Problem: Activity: Goal: Risk for activity intolerance will decrease Outcome: Progressing   Problem: Nutrition: Goal: Adequate nutrition will be maintained Outcome: Progressing   Problem: Elimination: Goal: Will not experience complications related to bowel motility Outcome: Progressing   Problem: Pain Managment: Goal: General experience of comfort will improve and/or be controlled Outcome: Progressing

## 2023-10-04 NOTE — Progress Notes (Signed)
 NAME:  Shawna Smith, MRN:  [id number], DOB:  September 12, 1955, LOS: 3 ADMISSION DATE:  09/30/2023, CONSULTATION DATE: 10/03/2023 REFERRING MD: Dr. Vernon, CHIEF COMPLAINT: Hypoxemia  History of Present Illness:  68 year old woman with MS on Aubagio  ([facility]), debilitating left trigeminal neuralgia that failed gamma knife surgery 08/10/2023, chronic pain for which she has uptitrated oxcarbazepine , requires narcotics.  Admitted initially for severe hypokalemia and hypomagnesemia.  Pain control has been difficult, has received Dilaudid  IV, oxycodone  p.o., tramadol .  She has developed an oxygen requirement over the last 48 hours.  This has been uptitrated, now on 15 L/min.  BiPAP was considered overnight 8/2-8/3 but she could not tolerate it due to left facial pain.  Chest x-ray 8/3 confirmed some left lower lobe atelectasis, significant right middle lobe atelectasis with partial collapse, no other real infiltrates. Temp this am 100.42F.   Pertinent  Medical History   Past Medical History:  Diagnosis Date   Hypertension    MS (multiple sclerosis) (HCC)    Osteoporosis   Left trigeminal neuralgia  Significant Hospital Events: Including procedures, antibiotic start and stop dates in addition to other pertinent events   8/3 diuresed, [facility]NC  Interim History / Subjective:  Reports SOB better, does have creamy productive sputum at times, remains on HHFNC 80%, 50L, sats better when asleep  Afebrile Labs pending  Objective    Blood pressure (!) 157/78, pulse 81, temperature 99.1 F (37.3 C), temperature source Oral, resp. rate (!) 23, height 5' 3 (1.6 m), weight 44.4 kg, SpO2 (S) 93%.    FiO2 (%):  [80 %-90 %] 80 %   Intake/Output Summary (Last 24 hours) at 10/04/2023 0831 Last data filed at 10/04/2023 0302 Gross per 24 hour  Intake 841 ml  Output 1350 ml  Net -509 ml   Filed Weights   09/30/23 2108 10/01/23 0513 10/04/23 0600  Weight: 41.3 kg 42.3 kg 44.4 kg    Examination: General:  Thin older  female, sleeping in NAD  HEENT: MM pink/moist, no JVD Neuro:  easily awakens, oriented x3, slowed at times and stoic, MAE CV: rr, NSR PULM:  non labored on HHFNC 80%/ 50L, clear anteriorly, bibasilar rales/ diminished, shallow at times, able to take deeper breaths on request, O2 sats better while sleeping GI: soft, bs+, NT, purwick Extremities: warm/dry, no LE edema  Skin: no rashes   UOP 1.3L/ 24hrs Net -2.9L   Resolved problem list   Assessment and Plan   Acute hypoxemic respiratory failure in setting of presumed CAP, BLL atelectasis with partial RML collapse, pulmonary edema, hypoventilation in setting of narcotics, and presumed restrictive component r/t MS and muscle weakness History of tobacco use (quit 5 months ago) and presumed COPD, no PFTs available, no suspected AE Possible OSA, underwent a split-night sleep study 03/31/2022 at [facility], unknown results Left trigeminal neuralgia with severe pain Hypertension GERD History left occipital PCA CVA P:  - cont to wean HHFNC for goal sat > 90%, no WOB currently - cont to aggressively push Pulmonary hygiene- IS, flutter, OOB as able  - send expectorated sputum for cx, check urine strep and legionella.  Remains afebrile, PCT yest neg, MRSA PCR neg - cont ceftriaxone / azithro - cont BD, no current wheeze, defer steroids - diuretics as renally/ hemodynamically tolerated - minimize narcotics as able - PPI  - remainder per primary team, PCCM will continue to follow  Best Practice (right click and Reselect all SmartList Selections daily)  Per primary team  Labs   CBC: Recent Labs  Lab 09/30/23 2128 10/02/23 0343 10/03/23 0758  WBC 4.6 8.2 11.4*  NEUTROABS 2.0  --  9.2*  HGB 12.7 12.2 11.1*  HCT 36.4 36.9 33.6*  MCV 87.5 92.0 93.3  PLT 309 305 265    Basic Metabolic Panel: Recent Labs  Lab 09/30/23 2128 10/01/23 0105 10/01/23 0633 10/01/23 1533 10/02/23 0343 10/02/23 0409 10/02/23 0954 10/03/23 0758  10/03/23 1541  NA 136 136  --  139 139  --  138 141  --   K <2.0* 2.2*  --  2.8* 2.8*  --  2.9* 2.5* 2.9*  CL 94* 96*  --  105 103  --  101 99  --   CO2 25 27  --  23 26  --  25 27  --   GLUCOSE 149* 99  --  113* 141*  --  148* 114*  --   BUN 6* 7*  --  <5* <5*  --  <5* <5*  --   CREATININE 0.79 0.68  --  0.73 0.62  --  0.43* 0.61  --   CALCIUM  9.5 9.2  --  8.2* 8.6*  --  8.7* 8.8*  --   MG 1.2*  --  1.8 1.8  --  1.7  --  1.6*  --   PHOS  --   --  2.8 2.9  --   --   --   --   --    GFR: Estimated Creatinine Clearance: 49.1 mL/min (by C-G formula based on SCr of 0.61 mg/dL). Recent Labs  Lab 09/30/23 2128 10/02/23 0343 10/03/23 0758 10/03/23 1021  PROCALCITON  --   --   --  <0.10  WBC 4.6 8.2 11.4*  --     Liver Function Tests: No results for input(s): AST, ALT, ALKPHOS, BILITOT, PROT, ALBUMIN in the last 168 hours. No results for input(s): LIPASE, AMYLASE in the last 168 hours. No results for input(s): AMMONIA in the last 168 hours.  ABG    Component Value Date/Time   HCO3 30.8 (H) 05/28/2020 1720   O2SAT 26.7 05/28/2020 1720     Coagulation Profile: No results for input(s): INR, PROTIME in the last 168 hours.  Cardiac Enzymes: No results for input(s): CKTOTAL, CKMB, CKMBINDEX, TROPONINI in the last 168 hours.  HbA1C: No results found for: HGBA1C  CBG: No results for input(s): GLUCAP in the last 168 hours.   Allergies Allergies  Allergen Reactions   Ace Inhibitors Itching, Swelling and Other (See Comments)    Site of swelling not noted   Lisinopril Anaphylaxis, Swelling and Other (See Comments)    Tongue swells   Penicillins Shortness Of Breath, Itching and Swelling    Tolerates Cephalosporins   Amoxicillin Itching and Swelling   Sulfa Antibiotics Itching and Swelling   Tape Itching and Other (See Comments)    CAUSES EXTREME ITCHING     Home Medications  Prior to Admission medications   Medication Sig Start Date End  Date Taking? Authorizing Provider  acetaminophen  (TYLENOL ) 500 MG tablet Take 500 mg by mouth every 4 (four) hours as needed (pain).   Yes [provider]  amLODipine  (NORVASC ) 10 MG tablet Take 10 mg by mouth at bedtime. 05/02/20  Yes [provider]  aspirin  81 MG EC tablet Take 81 mg by mouth daily. 04/25/15  Yes [provider]  EPINEPHrine  0.3 mg/0.3 mL IJ SOAJ injection Inject 0.3 mg into the muscle once as needed for anaphylaxis. 12/23/21  Yes Raenelle Coria, MD  metoprolol  succinate (TOPROL -XL) 100 MG 24 hr tablet Take 100 mg by mouth daily. 03/05/20  Yes [provider]  Oxcarbazepine  (TRILEPTAL ) 300 MG tablet Take 300 mg by mouth 2 (two) times daily. 09/29/23  Yes [provider]  oxyCODONE  (ROXICODONE ) 5 MG immediate release tablet Take 1 tablet (5 mg total) by mouth every 4 (four) hours as needed for severe pain (pain score 7-10). 04/05/23  Yes Dreama Longs, MD  pantoprazole  (PROTONIX ) 40 MG tablet Take 40 mg by mouth daily. 06/29/23  Yes [provider]  rosuvastatin  (CRESTOR ) 5 MG tablet Take 5 mg by mouth daily. 07/23/23  Yes [provider]  Teriflunomide  7 MG TABS Take 7 mg by mouth at bedtime. 03/30/22  Yes [provider]  traZODone  (DESYREL ) 50 MG tablet Take 100 mg by mouth at bedtime as needed for sleep.   Yes [provider]  albuterol  (VENTOLIN  HFA) 108 (90 Base) MCG/ACT inhaler Inhale 2 puffs into the lungs 3 (three) times daily as needed for wheezing or shortness of breath. 12/23/21 10/01/23  Ghimire, Kuber, MD  amitriptyline (ELAVIL) 50 MG tablet Take 50 mg by mouth at bedtime. Patient not taking: Reported on 10/01/2023 03/13/19   [provider]  Ensure Plus (ENSURE PLUS) LIQD Take 237 mLs by mouth 3 (three) times daily between meals. Patient not taking: Reported on 10/01/2023    [provider]  ergocalciferol (VITAMIN D2) 1.25 MG (50000 UT) capsule Take 50,000 Units by mouth every  Thursday. Patient not taking: Reported on 10/01/2023    [provider]  hydrALAZINE  (APRESOLINE ) 50 MG tablet Take 50 mg by mouth 2 (two) times daily. Patient not taking: Reported on 10/01/2023    [provider]  losartan -hydrochlorothiazide  (HYZAAR) 50-12.5 MG tablet Take 1 tablet by mouth daily. Patient not taking: Reported on 10/01/2023 03/05/20   [provider]  Omega-3 Fatty Acids (FISH OIL) 1000 MG CAPS Take 1,000 mg by mouth daily. Patient not taking: Reported on 10/01/2023    [provider]  OXcarbazepine  (TRILEPTAL ) 150 MG tablet Take 150 mg by mouth in the morning and at bedtime. Patient not taking: Reported on 10/01/2023 04/08/20   [provider]  potassium chloride  SA (KLOR-CON  M) 20 MEQ tablet Take 20 mEq by mouth daily. Patient not taking: Reported on 10/01/2023    [provider]  tamsulosin (FLOMAX) 0.4 MG CAPS capsule Take 0.4 mg by mouth daily after breakfast. Patient not taking: Reported on 10/01/2023    [provider]  traMADol  (ULTRAM -ER) 100 MG 24 hr tablet Take 100 mg by mouth in the morning. Patient not taking: Reported on 10/01/2023    [provider]     Critical care time: NA       Lyle Pesa, MSN, AG-ACNP-BC [facility] 10/04/2023, 8:31 AM  See Amion for pager If no response to pager , please call [phone] until 7pm After 7:00 pm call Elink  [phone]

## 2023-10-04 NOTE — Progress Notes (Signed)
 PROGRESS NOTE    Shawna Smith  FMW:[id number] DOB: Feb 21, 1956 DOA: 09/30/2023 PCP: Tonuzi, Lirim, MD   Brief Narrative:  Shawna Smith is a 68 y.o. female with medical history significant for MS, presumed trigeminal neuralgia with uncontrolled pain, hypertension admitted to the hospital for hypomagnesemia and hypokalemia.  She is followed by neurology Dr. Charlyne at [facility], takes Aubagio  for her MS has left-sided facial pain presumptive trigeminal neuralgia had gamma knife surgery 08/10/2023 unfortunately without improvement.  Her neurologist has been uptitrating her oxcarbazepine  in an attempt to manage her pain.  She had follow-up appointment with her PCP on 7/30, lab work was drawn and patient was noted to have significant hypokalemia and hypomagnesemia, was called and told to come to the ER.  Patient denies fevers, nausea, or abdominal pain.  She was admitted for electrolyte abnormalities however she then developed pneumonia, acute hypoxic respiratory failure, critical care consulted.  Details below.  Assessment & Plan:   Principal Problem:   Hypokalemia  Severe hypokalemia and hypomagnesemia/nausea-despite receiving several rounds of potassium, potassium remains low.  Magnesium  is normal today.  I will now aggressively replenish potassium and recheck labs in the morning.  Acute hypoxic respiratory failure secondary to community-acquired ammonia, not POA: Starting late afternoon 10/02/2023, patient was noted to require oxygen.  Required more oxygen support the same night.  Chest x-ray 10/03/2023 shows possibility of bilateral pneumonia.  Also appeared to have some vascular congestion, started on Rocephin , Zithromax  and diuretics, critical care consulted.  She is still on 30% of high flow oxygen.  PCCM on board and defer to them for the management of this.    Acute COPD exacerbation/past smoker/passive smoking: Patient used to be a smoker, she says that she quitted 5 months ago.  She further tells me  that she has a diagnosis of COPD however I could not find any records of that.  Despite of her stopping smoking 5 months ago, she has exposure to passive smoking constantly.  She does not have coarse breath sounds today as she had yesterday.  I gave her 1 dose of Solu-Medrol  yesterday.  I will defer further management to PCCM.  Diarrhea-patient without abdominal discomfort, bleeding, fevers or other evidence of complication.  Doubt infectious diarrhea.  Diarrhea has resolved.   History of hypertension but now with hypertensive urgency-blood pressure was significantly elevated in the beginning, she was on amlodipine  and Toprol -XL.  It appeared that she was also supposed to be on hydralazine  and hydrochlorothiazide  however I added her on hydralazine  and losartan .  Gradually we increased hydralazine  to 50 mg 3 times daily.  Now her blood pressure is running low.  I am now going to discontinue hydralazine  as well as losartan .  Continue amlodipine  and Toprol -XL with hold with parameters.   GERD-Protonix    trigeminal neuralgia-complains of left eye pain but she further explains that she has been having that pain since about a year.  Continue oxcarbazepine  300 mg p.o. twice daily.  Artificial tears.  Continue pain medications as above.   Left occipital (PCA distribution) stroke-Daily ASA, Crestor   DVT prophylaxis: enoxaparin  (LOVENOX ) injection 30 mg Start: 10/01/23 1030   Code Status: Full Code  Family Communication:  None present at bedside.  Plan of care discussed with patient in length and he/she verbalized understanding and agreed with it.  Status is: Inpatient Remains inpatient appropriate because: Hypoxic   Estimated body mass index is 17.34 kg/m as calculated from the following:   Height as of this encounter: 5' 3 (1.6  m).   Weight as of this encounter: 44.4 kg.    Nutritional Assessment: Body mass index is 17.34 kg/m.SABRA Seen by dietician.  I agree with the assessment and plan as  outlined below: Nutrition Status:        . Skin Assessment: I have examined the patient's skin and I agree with the wound assessment as performed by the wound care RN as outlined below:    Consultants:  None  Procedures:  None  Antimicrobials:  Anti-infectives (From admission, onward)    Start     Dose/Rate Route Frequency Ordered Stop   10/03/23 1100  cefTRIAXone  (ROCEPHIN ) 1 g in sodium chloride  0.9 % 100 mL IVPB        1 g 200 mL/hr over 30 Minutes Intravenous Every 24 hours 10/03/23 0955     10/03/23 1030  levofloxacin  (LEVAQUIN ) IVPB 750 mg  Status:  Discontinued        750 mg 100 mL/hr over 90 Minutes Intravenous Every 24 hours 10/03/23 0938 10/03/23 0954   10/03/23 1030  azithromycin  (ZITHROMAX ) 500 mg in sodium chloride  0.9 % 250 mL IVPB        500 mg 250 mL/hr over 60 Minutes Intravenous Every 24 hours 10/03/23 0938           Subjective: Seen and examined.  She says that she is now feeling better since the morning nurse adjusted the oxygen very well, she was very upset with how she was scared overnight by the nurse.  Objective: Vitals:   10/04/23 0738 10/04/23 0800 10/04/23 0855 10/04/23 0918  BP:    (!) 114/57  Pulse:    81  Resp:    17  Temp:  99.7 F (37.6 C)    TempSrc:  Axillary    SpO2: (S) 93%  98% 95%  Weight:      Height:        Intake/Output Summary (Last 24 hours) at 10/04/2023 1017 Last data filed at 10/04/2023 0302 Gross per 24 hour  Intake 841 ml  Output 1350 ml  Net -509 ml   Filed Weights   09/30/23 2108 10/01/23 0513 10/04/23 0600  Weight: 41.3 kg 42.3 kg 44.4 kg    Examination:  General exam: Appears calm and comfortable, appears cachectic and chronically sick Respiratory system: Diminished breath sounds with poor inspiratory effort, no wheezes or crackles. Cardiovascular system: S1 & S2 heard, RRR. No JVD, murmurs, rubs, gallops or clicks. No pedal edema. Gastrointestinal system: Abdomen is nondistended, soft and nontender.  No organomegaly or masses felt. Normal bowel sounds heard. Central nervous system: Alert and oriented. No focal neurological deficits. Extremities: Symmetric 5 x 5 power. Skin: No rashes, lesions or ulcers.  Psychiatry: Judgement and insight appear normal. Mood & affect appropriate.    Data Reviewed: I have personally reviewed following labs and imaging studies  CBC: Recent Labs  Lab 09/30/23 2128 10/02/23 0343 10/03/23 0758 10/04/23 0901  WBC 4.6 8.2 11.4* 14.1*  NEUTROABS 2.0  --  9.2* 12.3*  HGB 12.7 12.2 11.1* 10.3*  HCT 36.4 36.9 33.6* 31.3*  MCV 87.5 92.0 93.3 93.2  PLT 309 305 265 213   Basic Metabolic Panel: Recent Labs  Lab 10/01/23 0633 10/01/23 1533 10/02/23 0343 10/02/23 0409 10/02/23 0954 10/03/23 0758 10/03/23 1541 10/04/23 0901  NA  --  139 139  --  138 141  --  135  K  --  2.8* 2.8*  --  2.9* 2.5* 2.9* 2.9*  CL  --  105 103  --  101 99  --  97*  CO2  --  23 26  --  25 27  --  26  GLUCOSE  --  113* 141*  --  148* 114*  --  122*  BUN  --  <5* <5*  --  <5* <5*  --  7*  CREATININE  --  0.73 0.62  --  0.43* 0.61  --  0.48  CALCIUM   --  8.2* 8.6*  --  8.7* 8.8*  --  8.8*  MG 1.8 1.8  --  1.7  --  1.6*  --  1.9  PHOS 2.8 2.9  --   --   --   --   --   --    GFR: Estimated Creatinine Clearance: 49.1 mL/min (by C-G formula based on SCr of 0.48 mg/dL). Liver Function Tests: No results for input(s): AST, ALT, ALKPHOS, BILITOT, PROT, ALBUMIN in the last 168 hours. No results for input(s): LIPASE, AMYLASE in the last 168 hours. No results for input(s): AMMONIA in the last 168 hours. Coagulation Profile: No results for input(s): INR, PROTIME in the last 168 hours. Cardiac Enzymes: No results for input(s): CKTOTAL, CKMB, CKMBINDEX, TROPONINI in the last 168 hours. BNP (last 3 results) No results for input(s): PROBNP in the last 8760 hours. HbA1C: No results for input(s): HGBA1C in the last 72 hours. CBG: No results for  input(s): GLUCAP in the last 168 hours. Lipid Profile: No results for input(s): CHOL, HDL, LDLCALC, TRIG, CHOLHDL, LDLDIRECT in the last 72 hours. Thyroid Function Tests: No results for input(s): TSH, T4TOTAL, FREET4, T3FREE, THYROIDAB in the last 72 hours. Anemia Panel: No results for input(s): VITAMINB12, FOLATE, FERRITIN, TIBC, IRON, RETICCTPCT in the last 72 hours. Sepsis Labs: Recent Labs  Lab 10/03/23 1021  PROCALCITON <0.10    Recent Results (from the past 240 hours)  MRSA Next Gen by PCR, Nasal     Status: None   Collection Time: 10/01/23  4:54 AM   Specimen: Nasal Mucosa; Nasal Swab  Result Value Ref Range Status   MRSA by PCR Next Gen NOT DETECTED NOT DETECTED Final    Comment: (NOTE) The GeneXpert MRSA Assay (FDA approved for NASAL specimens only), is one component of a comprehensive MRSA colonization surveillance program. It is not intended to diagnose MRSA infection nor to guide or monitor treatment for MRSA infections. Test performance is not FDA approved in patients less than 18 years old. Performed at [facility], [address], [place], [place] [address]      Radiology Studies: DG Chest Port 1 View Result Date: 10/04/2023 EXAM: 1 VIEW XRAY OF THE CHEST 10/04/2023 04:57:00 AM COMPARISON: 10/03/2023 CLINICAL HISTORY: Respiratory failure (HCC) FINDINGS: LUNGS AND PLEURA: Scattered interstitial and airspace opacities in both lung bases, left greater than right, with some decrease in the more dense consolidation seen previously. Improved lung volumes. Blunting of left lateral costophrenic angle. HEART AND MEDIASTINUM: Atheromatous aorta. BONES AND SOFT TISSUES: No acute osseous abnormality. IMPRESSION: 1. Scattered interstitial and airspace opacities in both lung bases, left greater than right, with some decrease in the more dense consolidation seen previously. 2. Blunting of left lateral costophrenic angle.  Electronically signed by: Dayne Hassell MD 10/04/2023 07:28 AM EDT RP Workstation: [id number]   DG CHEST PORT 1 VIEW Result Date: 10/03/2023 EXAM: 1 VIEW XRAY OF THE CHEST 10/03/2023 08:30:00 AM COMPARISON: 04/28/2023 CLINICAL HISTORY: Acute on chronic respiratory failure with hypoxia. Per chart pt has had watery  nonbloody diarrhea up to 3 times a day for the last couple of weeks. FINDINGS: LUNGS AND PLEURA: New airspace opacities at both lung bases with possible associated small pleural effusions. HEART AND MEDIASTINUM: Atheromatous aorta. BONES AND SOFT TISSUES: No acute osseous abnormality. IMPRESSION: 1. New airspace opacities at both lung bases with possible associated small pleural effusions, compared to 04/28/2023. 2. Atheromatous aorta. Electronically signed by: Dayne Hassell MD 10/03/2023 09:00 AM EDT RP Workstation: [id number]    Scheduled Meds:  amLODipine   10 mg Oral Daily   aspirin  EC  81 mg Oral Daily   Chlorhexidine  Gluconate Cloth  6 each Topical Daily   enoxaparin  (LOVENOX ) injection  30 mg Subcutaneous Daily   hydrALAZINE   50 mg Oral TID   ipratropium-albuterol   3 mL Nebulization QID   lidocaine   15 mL Mouth/Throat Once   losartan   50 mg Oral Daily   magic mouthwash  5 mL Oral TID   metoprolol  succinate  100 mg Oral Daily   Oxcarbazepine   300 mg Oral BID   pantoprazole   40 mg Oral Daily   pneumococcal 20-valent conjugate vaccine  0.5 mL Intramuscular Tomorrow-1000   potassium chloride   40 mEq Oral Q4H   rosuvastatin   5 mg Oral Daily   Teriflunomide   7 mg Oral QHS   Continuous Infusions:  azithromycin  500 mg (10/04/23 0942)   cefTRIAXone  (ROCEPHIN )  IV Stopped (10/03/23 1219)     LOS: 3 days   Fredia Skeeter, MD [facility]  10/04/2023, 10:17 AM   *Please note that this is a verbal dictation therefore any spelling or grammatical errors are due to the Dragon Medical One system interpretation.  Please page via Amion and do not message via secure chat for  urgent patient care matters. Secure chat can be used for non urgent patient care matters.  How to contact the [facility] Attending or Consulting provider 7A - 7P or covering provider during after hours 7P -7A, for this patient?  Check the care team in [facility] and look for a) attending/consulting [facility] provider listed and b) the [facility] team listed. Page or secure chat 7A-7P. Log into [email or web address] and use [place]'s universal password to access. If you do not have the password, please contact the hospital operator. Locate the [facility] provider you are looking for under [facility] and page to a number that you can be directly reached. If you still have difficulty reaching the provider, please page the [facility] (Director on Call) for the Hospitalists listed on amion for assistance.

## 2023-10-05 DIAGNOSIS — I10 Essential (primary) hypertension: Secondary | ICD-10-CM | POA: Diagnosis not present

## 2023-10-05 DIAGNOSIS — J9601 Acute respiratory failure with hypoxia: Secondary | ICD-10-CM | POA: Diagnosis not present

## 2023-10-05 DIAGNOSIS — E876 Hypokalemia: Secondary | ICD-10-CM | POA: Diagnosis not present

## 2023-10-05 DIAGNOSIS — Z87891 Personal history of nicotine dependence: Secondary | ICD-10-CM | POA: Diagnosis not present

## 2023-10-05 LAB — CBC WITH DIFFERENTIAL/PLATELET
Abs Immature Granulocytes: 0.06 K/uL (ref 0.00–0.07)
Basophils Absolute: 0 K/uL (ref 0.0–0.1)
Basophils Relative: 0 %
Eosinophils Absolute: 0 K/uL (ref 0.0–0.5)
Eosinophils Relative: 0 %
HCT: 30.4 % — ABNORMAL LOW (ref 36.0–46.0)
Hemoglobin: 9.7 g/dL — ABNORMAL LOW (ref 12.0–15.0)
Immature Granulocytes: 1 %
Lymphocytes Relative: 8 %
Lymphs Abs: 0.9 K/uL (ref 0.7–4.0)
MCH: 29.9 pg (ref 26.0–34.0)
MCHC: 31.9 g/dL (ref 30.0–36.0)
MCV: 93.8 fL (ref 80.0–100.0)
Monocytes Absolute: 0.9 K/uL (ref 0.1–1.0)
Monocytes Relative: 8 %
Neutro Abs: 9 K/uL — ABNORMAL HIGH (ref 1.7–7.7)
Neutrophils Relative %: 83 %
Platelets: 190 K/uL (ref 150–400)
RBC: 3.24 MIL/uL — ABNORMAL LOW (ref 3.87–5.11)
RDW: 16.3 % — ABNORMAL HIGH (ref 11.5–15.5)
WBC: 10.9 K/uL — ABNORMAL HIGH (ref 4.0–10.5)
nRBC: 0 % (ref 0.0–0.2)

## 2023-10-05 LAB — MAGNESIUM: Magnesium: 1.8 mg/dL (ref 1.7–2.4)

## 2023-10-05 LAB — BASIC METABOLIC PANEL WITH GFR
Anion gap: 12 (ref 5–15)
BUN: 8 mg/dL (ref 8–23)
CO2: 22 mmol/L (ref 22–32)
Calcium: 8.9 mg/dL (ref 8.9–10.3)
Chloride: 101 mmol/L (ref 98–111)
Creatinine, Ser: 0.52 mg/dL (ref 0.44–1.00)
GFR, Estimated: >60 mL/min (ref >60)
Glucose, Bld: 116 mg/dL — ABNORMAL HIGH (ref 70–99)
Potassium: 5.1 mmol/L (ref 3.5–5.1)
Sodium: 135 mmol/L (ref 135–145)

## 2023-10-05 MED ORDER — MAGNESIUM SULFATE 2 GM/50ML IV SOLN
2.0000 g | Freq: Once | INTRAVENOUS | Status: AC
  Administered 2023-10-05: 2 g via INTRAVENOUS
  Filled 2023-10-05: qty 50

## 2023-10-05 MED ORDER — FUROSEMIDE 10 MG/ML IJ SOLN
40.0000 mg | Freq: Once | INTRAMUSCULAR | Status: AC
  Administered 2023-10-05: 40 mg via INTRAVENOUS
  Filled 2023-10-05: qty 4

## 2023-10-05 NOTE — Plan of Care (Signed)
   Problem: Education: Goal: Knowledge of General Education information will improve Description: Including pain rating scale, medication(s)/side effects and non-pharmacologic comfort measures Outcome: Progressing   Problem: Nutrition: Goal: Adequate nutrition will be maintained Outcome: Progressing   Problem: Coping: Goal: Level of anxiety will decrease Outcome: Progressing   Problem: Elimination: Goal: Will not experience complications related to bowel motility Outcome: Progressing Goal: Will not experience complications related to urinary retention Outcome: Progressing   Problem: Pain Managment: Goal: General experience of comfort will improve and/or be controlled Outcome: Progressing   Problem: Safety: Goal: Ability to remain free from injury will improve Outcome: Progressing

## 2023-10-05 NOTE — Progress Notes (Signed)
 NAME:  Shawna Smith, MRN:  [id number], DOB:  November 05, 1955, LOS: 4 ADMISSION DATE:  09/30/2023, CONSULTATION DATE: 10/03/2023 REFERRING MD: Dr. Vernon, CHIEF COMPLAINT: Hypoxemia  History of Present Illness:  68 year old woman with MS on Aubagio  ([facility]), debilitating left trigeminal neuralgia that failed gamma knife surgery 08/10/2023, chronic pain for which she has uptitrated oxcarbazepine , requires narcotics.  Admitted initially for severe hypokalemia and hypomagnesemia.  Pain control has been difficult, has received Dilaudid  IV, oxycodone  p.o., tramadol .  She has developed an oxygen requirement over the last 48 hours.  This has been uptitrated, now on 15 L/min.  BiPAP was considered overnight 8/2-8/3 but she could not tolerate it due to left facial pain.  Chest x-ray 8/3 confirmed some left lower lobe atelectasis, significant right middle lobe atelectasis with partial collapse, no other real infiltrates. Temp this am 100.29F.   Pertinent  Medical History   Past Medical History:  Diagnosis Date   Hypertension    MS (multiple sclerosis) (HCC)    Osteoporosis   Left trigeminal neuralgia  Significant Hospital Events: Including procedures, antibiotic start and stop dates in addition to other pertinent events   8/3 diuresed, HHFNC, did not tolerate Bipap 8/4 weaning down HHFNC  Interim History / Subjective:  Weaned to 4L salter HFNC this am Overall breathing is better, occasionally still productive cough at times  Objective    Blood pressure (!) 178/97, pulse 97, temperature 99.7 F (37.6 C), temperature source Axillary, resp. rate (!) 21, height 5' 3 (1.6 m), weight 44.4 kg, SpO2 96%.    FiO2 (%):  [50 %] 50 %   Intake/Output Summary (Last 24 hours) at 10/05/2023 0942 Last data filed at 10/05/2023 9291 Gross per 24 hour  Intake 759.87 ml  Output 150 ml  Net 609.87 ml   Filed Weights   09/30/23 2108 10/01/23 0513 10/04/23 0600  Weight: 41.3 kg 42.3 kg 44.4 kg     Examination: General:  Thin older female sitting up in bed eating breakfast in NAD HEENT: MM pink/moist Neuro: Alert, oriented x 3, MAE, stoic at times CV: rr, NSR PULM:  non labored, clear anteriorly, few posteriorly bibasilar rales  GI: soft, bs+ Extremities: warm/dry, no LE edema  Skin: no rashes  UOP 325 ml/ 24hrs Net -2.5L Labs reviewed> K 5.1, sCr 0.52, Mag 1.8  Resolved problem list   Assessment and Plan   Acute hypoxemic respiratory failure in setting of presumed CAP, BLL atelectasis with partial RML collapse with shunting, possible pulmonary edema, hypoventilation in setting of narcotics,  History of tobacco use (quit 5 months ago)  Presumed COPD, no PFTs available, no suspected AE Possible OSA, underwent a split-night sleep study 03/31/2022 at [facility], unknown results Left trigeminal neuralgia with severe pain Hypertension GERD History left occipital PCA CVA P:  - off HHFNC down to salter.  Cont to wean for sat goal > 88% - pending urine strep, legionella and expectorated sputum - cont CAP coverage- azithro/ ctx - push Pulmonary hygiene- IS, add flutter, OOB today> hopefully ambulate - cont BD, no current wheeze, defer steroids - lasix  today  - minimize narcotics as able  - PPI    - Nothing further to add.  PCCM will sign off.  Please call us  back if we can be of any further assistance.   Best Practice (right click and Reselect all SmartList Selections daily)  Per primary team  Labs   CBC: Recent Labs  Lab 09/30/23 2128 10/02/23 9656 10/03/23 9241 10/04/23 0901 10/05/23 0309  WBC 4.6 8.2 11.4* 14.1* 10.9*  NEUTROABS 2.0  --  9.2* 12.3* 9.0*  HGB 12.7 12.2 11.1* 10.3* 9.7*  HCT 36.4 36.9 33.6* 31.3* 30.4*  MCV 87.5 92.0 93.3 93.2 93.8  PLT 309 305 265 213 190    Basic Metabolic Panel: Recent Labs  Lab 10/01/23 0633 10/01/23 1533 10/02/23 0343 10/02/23 0409 10/02/23 0954 10/03/23 0758 10/03/23 1541 10/04/23 0901 10/05/23 0309  NA   --  139 139  --  138 141  --  135 135  K  --  2.8* 2.8*  --  2.9* 2.5* 2.9* 2.9* 5.1  CL  --  105 103  --  101 99  --  97* 101  CO2  --  23 26  --  25 27  --  26 22  GLUCOSE  --  113* 141*  --  148* 114*  --  122* 116*  BUN  --  <5* <5*  --  <5* <5*  --  7* 8  CREATININE  --  0.73 0.62  --  0.43* 0.61  --  0.48 0.52  CALCIUM   --  8.2* 8.6*  --  8.7* 8.8*  --  8.8* 8.9  MG 1.8 1.8  --  1.7  --  1.6*  --  1.9 1.8  PHOS 2.8 2.9  --   --   --   --   --   --   --    GFR: Estimated Creatinine Clearance: 49.1 mL/min (by C-G formula based on SCr of 0.52 mg/dL). Recent Labs  Lab 10/02/23 0343 10/03/23 0758 10/03/23 1021 10/04/23 0901 10/05/23 0309  PROCALCITON  --   --  <0.10  --   --   WBC 8.2 11.4*  --  14.1* 10.9*    Liver Function Tests: No results for input(s): AST, ALT, ALKPHOS, BILITOT, PROT, ALBUMIN in the last 168 hours. No results for input(s): LIPASE, AMYLASE in the last 168 hours. No results for input(s): AMMONIA in the last 168 hours.  ABG    Component Value Date/Time   HCO3 30.8 (H) 05/28/2020 1720   O2SAT 26.7 05/28/2020 1720     Coagulation Profile: No results for input(s): INR, PROTIME in the last 168 hours.  Cardiac Enzymes: No results for input(s): CKTOTAL, CKMB, CKMBINDEX, TROPONINI in the last 168 hours.  HbA1C: No results found for: HGBA1C  CBG: No results for input(s): GLUCAP in the last 168 hours.   Allergies Allergies  Allergen Reactions   Ace Inhibitors Itching, Swelling and Other (See Comments)    Site of swelling not noted   Lisinopril Anaphylaxis, Swelling and Other (See Comments)    Tongue swells   Penicillins Shortness Of Breath, Itching and Swelling    Tolerates Cephalosporins   Amoxicillin Itching and Swelling   Sulfa Antibiotics Itching and Swelling   Tape Itching and Other (See Comments)    CAUSES EXTREME ITCHING     Home Medications  Prior to Admission medications   Medication Sig Start Date  End Date Taking? Authorizing Provider  acetaminophen  (TYLENOL ) 500 MG tablet Take 500 mg by mouth every 4 (four) hours as needed (pain).   Yes [provider]  amLODipine  (NORVASC ) 10 MG tablet Take 10 mg by mouth at bedtime. 05/02/20  Yes [provider]  aspirin  81 MG EC tablet Take 81 mg by mouth daily. 04/25/15  Yes [provider]  EPINEPHrine  0.3 mg/0.3 mL IJ SOAJ injection Inject 0.3 mg into the muscle once as needed for  anaphylaxis. 12/23/21  Yes Ghimire, Kuber, MD  metoprolol  succinate (TOPROL -XL) 100 MG 24 hr tablet Take 100 mg by mouth daily. 03/05/20  Yes [provider]  Oxcarbazepine  (TRILEPTAL ) 300 MG tablet Take 300 mg by mouth 2 (two) times daily. 09/29/23  Yes [provider]  oxyCODONE  (ROXICODONE ) 5 MG immediate release tablet Take 1 tablet (5 mg total) by mouth every 4 (four) hours as needed for severe pain (pain score 7-10). 04/05/23  Yes Dreama Longs, MD  pantoprazole  (PROTONIX ) 40 MG tablet Take 40 mg by mouth daily. 06/29/23  Yes [provider]  rosuvastatin  (CRESTOR ) 5 MG tablet Take 5 mg by mouth daily. 07/23/23  Yes [provider]  Teriflunomide  7 MG TABS Take 7 mg by mouth at bedtime. 03/30/22  Yes [provider]  traZODone  (DESYREL ) 50 MG tablet Take 100 mg by mouth at bedtime as needed for sleep.   Yes [provider]  albuterol  (VENTOLIN  HFA) 108 (90 Base) MCG/ACT inhaler Inhale 2 puffs into the lungs 3 (three) times daily as needed for wheezing or shortness of breath. 12/23/21 10/01/23  Ghimire, Kuber, MD  amitriptyline (ELAVIL) 50 MG tablet Take 50 mg by mouth at bedtime. Patient not taking: Reported on 10/01/2023 03/13/19   [provider]  Ensure Plus (ENSURE PLUS) LIQD Take 237 mLs by mouth 3 (three) times daily between meals. Patient not taking: Reported on 10/01/2023    [provider]  ergocalciferol (VITAMIN D2) 1.25 MG (50000 UT) capsule Take 50,000 Units by mouth every  Thursday. Patient not taking: Reported on 10/01/2023    [provider]  hydrALAZINE  (APRESOLINE ) 50 MG tablet Take 50 mg by mouth 2 (two) times daily. Patient not taking: Reported on 10/01/2023    [provider]  losartan -hydrochlorothiazide  (HYZAAR) 50-12.5 MG tablet Take 1 tablet by mouth daily. Patient not taking: Reported on 10/01/2023 03/05/20   [provider]  Omega-3 Fatty Acids (FISH OIL) 1000 MG CAPS Take 1,000 mg by mouth daily. Patient not taking: Reported on 10/01/2023    [provider]  OXcarbazepine  (TRILEPTAL ) 150 MG tablet Take 150 mg by mouth in the morning and at bedtime. Patient not taking: Reported on 10/01/2023 04/08/20   [provider]  potassium chloride  SA (KLOR-CON  M) 20 MEQ tablet Take 20 mEq by mouth daily. Patient not taking: Reported on 10/01/2023    [provider]  tamsulosin (FLOMAX) 0.4 MG CAPS capsule Take 0.4 mg by mouth daily after breakfast. Patient not taking: Reported on 10/01/2023    [provider]  traMADol  (ULTRAM -ER) 100 MG 24 hr tablet Take 100 mg by mouth in the morning. Patient not taking: Reported on 10/01/2023    [provider]     Critical care time: NA       Lyle Pesa, MSN, AG-ACNP-BC [facility] 10/05/2023, 9:42 AM  See Amion for pager If no response to pager , please call [phone] until 7pm After 7:00 pm call Elink  [phone]

## 2023-10-05 NOTE — Progress Notes (Signed)
 PROGRESS NOTE    Shawna Smith  FMW:[id number] DOB: December 08, 1955 DOA: 09/30/2023 PCP: Tonuzi, Lirim, MD   Brief Narrative:  Shawna Smith is a 68 y.o. female with medical history significant for MS, presumed trigeminal neuralgia with uncontrolled pain, hypertension admitted to the hospital for hypomagnesemia and hypokalemia.  She is followed by neurology Dr. Charlyne at [facility], takes Aubagio  for her MS has left-sided facial pain presumptive trigeminal neuralgia had gamma knife surgery 08/10/2023 unfortunately without improvement.  Her neurologist has been uptitrating her oxcarbazepine  in an attempt to manage her pain.  She had follow-up appointment with her PCP on 7/30, lab work was drawn and patient was noted to have significant hypokalemia and hypomagnesemia, was called and told to come to the ER.  Patient denies fevers, nausea, or abdominal pain.  She was admitted for electrolyte abnormalities however she then developed pneumonia, acute hypoxic respiratory failure, critical care consulted.  Details below.  Assessment & Plan:   Principal Problem:   Hypokalemia  Severe hypokalemia and hypomagnesemia/nausea-no more nausea.  Finally potassium has normalized.  Magnesium  low which is replenished.  Acute hypoxic respiratory failure secondary to community-acquired ammonia, not POA: Starting late afternoon 10/02/2023, patient was noted to require oxygen.  Required more oxygen support the same night.  Chest x-ray 10/03/2023 shows possibility of bilateral pneumonia.  Also appeared to have some vascular congestion, given 1 dose of Lasix , started on Rocephin , Zithromax  and critical care consulted.  She is still on 30 L / 50% of high flow oxygen.  PCCM on board and defer to them for the management of this.    Acute COPD exacerbation/past smoker/passive smoking: Patient used to be a smoker, she says that she quitted 5 months ago.  She further tells me that she has a diagnosis of COPD however I could not find any records of  that.  Despite of her stopping smoking 5 months ago, she has exposure to passive smoking constantly.  She does not have coarse breath sounds today as she had yesterday.  I gave her 1 dose of Solu-Medrol  yesterday.  I will defer further management to PCCM.  Diarrhea-patient without abdominal discomfort, bleeding, fevers or other evidence of complication.  Doubt infectious diarrhea.  Diarrhea has resolved.   History of hypertension but now with hypertensive urgency-blood pressure was significantly elevated in the beginning, she was on amlodipine  and Toprol -XL.  It appeared that she was also supposed to be on losartan  and hydrochlorothiazide  however I added her on hydralazine  and losartan .  Gradually we increased hydralazine  to 50 mg 3 times daily.  Then she had low blood pressure, we discontinued losartan  and hydralazine , blood pressure now fairly controlled.  Continue amlodipine  and Toprol -XL.   GERD-Protonix    trigeminal neuralgia-complains of left eye pain but she further explains that she has been having that pain since about a year.  Continue oxcarbazepine  300 mg p.o. twice daily.  Artificial tears.  Continue pain medications as above.   Left occipital (PCA distribution) stroke-Daily ASA, Crestor   DVT prophylaxis: enoxaparin  (LOVENOX ) injection 30 mg Start: 10/01/23 1030   Code Status: Full Code  Family Communication:  None present at bedside.  Plan of care discussed with patient in length and he/she verbalized understanding and agreed with it.  Status is: Inpatient Remains inpatient appropriate because: Hypoxic   Estimated body mass index is 17.34 kg/m as calculated from the following:   Height as of this encounter: 5' 3 (1.6 m).   Weight as of this encounter: 44.4 kg.  Nutritional Assessment: Body mass index is 17.34 kg/m.SABRA Seen by dietician.  I agree with the assessment and plan as outlined below: Nutrition Status:        . Skin Assessment: I have examined the patient's  skin and I agree with the wound assessment as performed by the wound care RN as outlined below:    Consultants:  None  Procedures:  None  Antimicrobials:  Anti-infectives (From admission, onward)    Start     Dose/Rate Route Frequency Ordered Stop   10/05/23 1000  azithromycin  (ZITHROMAX ) tablet 500 mg        500 mg Oral Daily 10/04/23 1319 10/08/23 0959   10/03/23 1100  cefTRIAXone  (ROCEPHIN ) 1 g in sodium chloride  0.9 % 100 mL IVPB        1 g 200 mL/hr over 30 Minutes Intravenous Every 24 hours 10/03/23 0955     10/03/23 1030  levofloxacin  (LEVAQUIN ) IVPB 750 mg  Status:  Discontinued        750 mg 100 mL/hr over 90 Minutes Intravenous Every 24 hours 10/03/23 0938 10/03/23 0954   10/03/23 1030  azithromycin  (ZITHROMAX ) 500 mg in sodium chloride  0.9 % 250 mL IVPB  Status:  Discontinued        500 mg 250 mL/hr over 60 Minutes Intravenous Every 24 hours 10/03/23 0938 10/04/23 1319         Subjective: Seen and examined.  Still complains of left-sided facial pain due to trigeminal neuralgia.  Respiratory symptoms have interaction, she appears comfortable and denies any shortness of breath.  Objective: Vitals:   10/05/23 0300 10/05/23 0400 10/05/23 0500 10/05/23 0600  BP: (!) 148/86 (!) 151/81 (!) 157/73 (!) 150/74  Pulse: 96 97 91 88  Resp: (!) 25 19 (!) 23 (!) 21  Temp:  99.7 F (37.6 C)    TempSrc:  Axillary    SpO2: 98% 100% 94% 94%  Weight:      Height:        Intake/Output Summary (Last 24 hours) at 10/05/2023 0736 Last data filed at 10/05/2023 0708 Gross per 24 hour  Intake 759.87 ml  Output 325 ml  Net 434.87 ml   Filed Weights   09/30/23 2108 10/01/23 0513 10/04/23 0600  Weight: 41.3 kg 42.3 kg 44.4 kg    Examination:  General exam: Appears calm and comfortable, appears chronically sick and cachectic Respiratory system: Faint rhonchi bilaterally with diminished breath sounds and poor respiratory effort Cardiovascular system: S1 & S2 heard, RRR. No JVD,  murmurs, rubs, gallops or clicks. No pedal edema. Gastrointestinal system: Abdomen is nondistended, soft and nontender. No organomegaly or masses felt. Normal bowel sounds heard. Central nervous system: Alert and oriented. No focal neurological deficits. Extremities: Symmetric 5 x 5 power. Skin: No rashes, lesions or ulcers.  Psychiatry: Judgement and insight appear normal. Mood & affect appropriate.    Data Reviewed: I have personally reviewed following labs and imaging studies  CBC: Recent Labs  Lab 09/30/23 2128 10/02/23 0343 10/03/23 0758 10/04/23 0901 10/05/23 0309  WBC 4.6 8.2 11.4* 14.1* 10.9*  NEUTROABS 2.0  --  9.2* 12.3* 9.0*  HGB 12.7 12.2 11.1* 10.3* 9.7*  HCT 36.4 36.9 33.6* 31.3* 30.4*  MCV 87.5 92.0 93.3 93.2 93.8  PLT 309 305 265 213 190   Basic Metabolic Panel: Recent Labs  Lab 10/01/23 0633 10/01/23 1533 10/02/23 0343 10/02/23 0409 10/02/23 0954 10/03/23 0758 10/03/23 1541 10/04/23 0901 10/05/23 0309  NA  --  139 139  --  138 141  --  135 135  K  --  2.8* 2.8*  --  2.9* 2.5* 2.9* 2.9* 5.1  CL  --  105 103  --  101 99  --  97* 101  CO2  --  23 26  --  25 27  --  26 22  GLUCOSE  --  113* 141*  --  148* 114*  --  122* 116*  BUN  --  <5* <5*  --  <5* <5*  --  7* 8  CREATININE  --  0.73 0.62  --  0.43* 0.61  --  0.48 0.52  CALCIUM   --  8.2* 8.6*  --  8.7* 8.8*  --  8.8* 8.9  MG 1.8 1.8  --  1.7  --  1.6*  --  1.9 1.8  PHOS 2.8 2.9  --   --   --   --   --   --   --    GFR: Estimated Creatinine Clearance: 49.1 mL/min (by C-G formula based on SCr of 0.52 mg/dL). Liver Function Tests: No results for input(s): AST, ALT, ALKPHOS, BILITOT, PROT, ALBUMIN in the last 168 hours. No results for input(s): LIPASE, AMYLASE in the last 168 hours. No results for input(s): AMMONIA in the last 168 hours. Coagulation Profile: No results for input(s): INR, PROTIME in the last 168 hours. Cardiac Enzymes: No results for input(s): CKTOTAL,  CKMB, CKMBINDEX, TROPONINI in the last 168 hours. BNP (last 3 results) No results for input(s): PROBNP in the last 8760 hours. HbA1C: No results for input(s): HGBA1C in the last 72 hours. CBG: No results for input(s): GLUCAP in the last 168 hours. Lipid Profile: No results for input(s): CHOL, HDL, LDLCALC, TRIG, CHOLHDL, LDLDIRECT in the last 72 hours. Thyroid Function Tests: No results for input(s): TSH, T4TOTAL, FREET4, T3FREE, THYROIDAB in the last 72 hours. Anemia Panel: No results for input(s): VITAMINB12, FOLATE, FERRITIN, TIBC, IRON, RETICCTPCT in the last 72 hours. Sepsis Labs: Recent Labs  Lab 10/03/23 1021  PROCALCITON <0.10    Recent Results (from the past 240 hours)  MRSA Next Gen by PCR, Nasal     Status: None   Collection Time: 10/01/23  4:54 AM   Specimen: Nasal Mucosa; Nasal Swab  Result Value Ref Range Status   MRSA by PCR Next Gen NOT DETECTED NOT DETECTED Final    Comment: (NOTE) The GeneXpert MRSA Assay (FDA approved for NASAL specimens only), is one component of a comprehensive MRSA colonization surveillance program. It is not intended to diagnose MRSA infection nor to guide or monitor treatment for MRSA infections. Test performance is not FDA approved in patients less than 89 years old. Performed at [facility], [address], [place], [place] [address]   Expectorated Sputum Assessment w Gram Stain, Rflx to Resp Cult     Status: None   Collection Time: 10/04/23  5:16 PM   Specimen: Sputum  Result Value Ref Range Status   Specimen Description SPU  Final   Special Requests NONE  Final   Sputum evaluation   Final    Sputum specimen not acceptable for testing.  Please recollect.   NOTIFIED DEBARAH SAILOR RN AT 8164 ON 10/04/2023 BY PRUDY POUR Performed at [facility], [address] [place], [place] [address]    Report Status 10/04/2023 FINAL  Final     Radiology Studies: DG  Chest Port 1 View Result Date: 10/04/2023 EXAM: 1 VIEW XRAY OF THE CHEST 10/04/2023  04:57:00 AM COMPARISON: 10/03/2023 CLINICAL HISTORY: Respiratory failure (HCC) FINDINGS: LUNGS AND PLEURA: Scattered interstitial and airspace opacities in both lung bases, left greater than right, with some decrease in the more dense consolidation seen previously. Improved lung volumes. Blunting of left lateral costophrenic angle. HEART AND MEDIASTINUM: Atheromatous aorta. BONES AND SOFT TISSUES: No acute osseous abnormality. IMPRESSION: 1. Scattered interstitial and airspace opacities in both lung bases, left greater than right, with some decrease in the more dense consolidation seen previously. 2. Blunting of left lateral costophrenic angle. Electronically signed by: Dayne Hassell MD 10/04/2023 07:28 AM EDT RP Workstation: [id number]   DG CHEST PORT 1 VIEW Result Date: 10/03/2023 EXAM: 1 VIEW XRAY OF THE CHEST 10/03/2023 08:30:00 AM COMPARISON: 04/28/2023 CLINICAL HISTORY: Acute on chronic respiratory failure with hypoxia. Per chart pt has had watery nonbloody diarrhea up to 3 times a day for the last couple of weeks. FINDINGS: LUNGS AND PLEURA: New airspace opacities at both lung bases with possible associated small pleural effusions. HEART AND MEDIASTINUM: Atheromatous aorta. BONES AND SOFT TISSUES: No acute osseous abnormality. IMPRESSION: 1. New airspace opacities at both lung bases with possible associated small pleural effusions, compared to 04/28/2023. 2. Atheromatous aorta. Electronically signed by: Dayne Hassell MD 10/03/2023 09:00 AM EDT RP Workstation: [id number]    Scheduled Meds:  amLODipine   10 mg Oral Daily   aspirin  EC  81 mg Oral Daily   azithromycin   500 mg Oral Daily   Chlorhexidine  Gluconate Cloth  6 each Topical Daily   enoxaparin  (LOVENOX ) injection  30 mg Subcutaneous Daily   ipratropium-albuterol   3 mL Nebulization QID   magic mouthwash  5 mL Oral TID   metoprolol  succinate  100 mg Oral Daily    Oxcarbazepine   300 mg Oral BID   pantoprazole   40 mg Oral Daily   pneumococcal 20-valent conjugate vaccine  0.5 mL Intramuscular Tomorrow-1000   rosuvastatin   5 mg Oral Daily   Teriflunomide   7 mg Oral QHS   Continuous Infusions:  cefTRIAXone  (ROCEPHIN )  IV Stopped (10/04/23 1142)     LOS: 4 days   Fredia Skeeter, MD [facility]  10/05/2023, 7:36 AM   *Please note that this is a verbal dictation therefore any spelling or grammatical errors are due to the Dragon Medical One system interpretation.  Please page via Amion and do not message via secure chat for urgent patient care matters. Secure chat can be used for non urgent patient care matters.  How to contact the [facility] Attending or Consulting provider 7A - 7P or covering provider during after hours 7P -7A, for this patient?  Check the care team in [facility] and look for a) attending/consulting [facility] provider listed and b) the [facility] team listed. Page or secure chat 7A-7P. Log into [email or web address] and use [facility] universal password to access. If you do not have the password, please contact the hospital operator. Locate the [facility] provider you are looking for under [facility] and page to a number that you can be directly reached. If you still have difficulty reaching the provider, please page the [facility] (Director on Call) for the Hospitalists listed on amion for assistance.

## 2023-10-05 NOTE — Progress Notes (Signed)
 eLink Physician-Brief Progress Note Patient Name: Shawna Smith DOB: 1955-12-25 MRN: [id number]   Date of Service  10/05/2023  HPI/Events of Note  Low normal magnesium  on a.m. labs.  eICU Interventions  Magnesium  sulfate 2 g IV ordered.     Intervention Category Minor Interventions: Electrolytes abnormality - evaluation and management  Jerilynn Berg 10/05/2023, 5:21 AM

## 2023-10-06 DIAGNOSIS — E876 Hypokalemia: Secondary | ICD-10-CM | POA: Diagnosis not present

## 2023-10-06 LAB — CBC WITH DIFFERENTIAL/PLATELET
Abs Immature Granulocytes: 0.05 K/uL (ref 0.00–0.07)
Basophils Absolute: 0 K/uL (ref 0.0–0.1)
Basophils Relative: 1 %
Eosinophils Absolute: 0.2 K/uL (ref 0.0–0.5)
Eosinophils Relative: 2 %
HCT: 27.9 % — ABNORMAL LOW (ref 36.0–46.0)
Hemoglobin: 9.2 g/dL — ABNORMAL LOW (ref 12.0–15.0)
Immature Granulocytes: 1 %
Lymphocytes Relative: 17 %
Lymphs Abs: 1.3 K/uL (ref 0.7–4.0)
MCH: 29.7 pg (ref 26.0–34.0)
MCHC: 33 g/dL (ref 30.0–36.0)
MCV: 90 fL (ref 80.0–100.0)
Monocytes Absolute: 0.9 K/uL (ref 0.1–1.0)
Monocytes Relative: 11 %
Neutro Abs: 5.6 K/uL (ref 1.7–7.7)
Neutrophils Relative %: 68 %
Platelets: 196 K/uL (ref 150–400)
RBC: 3.1 MIL/uL — ABNORMAL LOW (ref 3.87–5.11)
RDW: 16.1 % — ABNORMAL HIGH (ref 11.5–15.5)
WBC: 8 K/uL (ref 4.0–10.5)
nRBC: 0 % (ref 0.0–0.2)

## 2023-10-06 LAB — BASIC METABOLIC PANEL WITH GFR
Anion gap: 13 (ref 5–15)
BUN: 8 mg/dL (ref 8–23)
CO2: 27 mmol/L (ref 22–32)
Calcium: 9 mg/dL (ref 8.9–10.3)
Chloride: 92 mmol/L — ABNORMAL LOW (ref 98–111)
Creatinine, Ser: 0.4 mg/dL — ABNORMAL LOW (ref 0.44–1.00)
GFR, Estimated: >60 mL/min (ref >60)
Glucose, Bld: 104 mg/dL — ABNORMAL HIGH (ref 70–99)
Potassium: 3.3 mmol/L — ABNORMAL LOW (ref 3.5–5.1)
Sodium: 132 mmol/L — ABNORMAL LOW (ref 135–145)

## 2023-10-06 LAB — MAGNESIUM: Magnesium: 1.8 mg/dL (ref 1.7–2.4)

## 2023-10-06 LAB — LEGIONELLA PNEUMOPHILA SEROGP 1 UR AG: L. pneumophila Serogp 1 Ur Ag: NEGATIVE

## 2023-10-06 LAB — STREP PNEUMONIAE URINARY ANTIGEN: Strep Pneumo Urinary Antigen: NEGATIVE

## 2023-10-06 MED ORDER — POTASSIUM CHLORIDE CRYS ER 20 MEQ PO TBCR
40.0000 meq | EXTENDED_RELEASE_TABLET | Freq: Two times a day (BID) | ORAL | Status: AC
  Administered 2023-10-06 (×2): 40 meq via ORAL
  Filled 2023-10-06 (×2): qty 2

## 2023-10-06 MED ORDER — BOOST / RESOURCE BREEZE PO LIQD CUSTOM
1.0000 | Freq: Three times a day (TID) | ORAL | Status: DC
  Administered 2023-10-06 – 2023-10-08 (×5): 1 via ORAL

## 2023-10-06 MED ORDER — ARFORMOTEROL TARTRATE 15 MCG/2ML IN NEBU
15.0000 ug | INHALATION_SOLUTION | Freq: Two times a day (BID) | RESPIRATORY_TRACT | Status: DC
  Administered 2023-10-06 – 2023-10-08 (×5): 15 ug via RESPIRATORY_TRACT
  Filled 2023-10-06 (×5): qty 2

## 2023-10-06 MED ORDER — GUAIFENESIN ER 600 MG PO TB12
1200.0000 mg | ORAL_TABLET | Freq: Two times a day (BID) | ORAL | Status: DC
  Administered 2023-10-06 – 2023-10-08 (×5): 1200 mg via ORAL
  Filled 2023-10-06 (×5): qty 2

## 2023-10-06 MED ORDER — BUDESONIDE 0.25 MG/2ML IN SUSP
0.2500 mg | Freq: Two times a day (BID) | RESPIRATORY_TRACT | Status: DC
  Administered 2023-10-06 – 2023-10-08 (×5): 0.25 mg via RESPIRATORY_TRACT
  Filled 2023-10-06 (×5): qty 2

## 2023-10-06 MED ORDER — ADULT MULTIVITAMIN W/MINERALS CH
1.0000 | ORAL_TABLET | Freq: Every day | ORAL | Status: DC
  Administered 2023-10-07 – 2023-10-08 (×2): 1 via ORAL
  Filled 2023-10-06 (×2): qty 1

## 2023-10-06 NOTE — Progress Notes (Signed)
 PROGRESS NOTE    Shawna Smith  FMW:[id number] DOB: 21-Oct-1955 DOA: 09/30/2023 PCP: Tonuzi, Lirim, MD   Brief Narrative: 68 year old with past medical history significant for MS, presumed trigeminal neuralgia with uncontrolled pain, hypertension presents to the hospital with hypomagnesemia, hypokalemia.  Who had gamma knife surgery 08/13/2023 for trigeminal neuralgia unfortunately without improvement.  Her medication has been uptitrating (oxcarbazepine ).  She saw her PCP on 7/30, lab work noted significant hypokalemia hypomagnesemia she was advised to present to the ED.  Patient was admitted for electrolytes abnormality however she developed pneumonia, acute hypoxic respiratory failure.  Critical care was consulted.     Assessment & Plan:   Principal Problem:   Hypokalemia   1-Severe electrolyte abnormality, hypokalemia hypomagnesemia -Continue with supplement.  -Monitor labs daily  -in setting of diarrhea.  Okay.  Keep  Acute hypoxic respiratory failure in the setting of community-acquired pneumonia - Patient was noted to require oxygen supplementation on 8/2 night, chest x-ray 80/3 showed possible bilateral pneumonia. - She was treated with IV antibiotics and received IV Lasix .  She required 30 L of oxygen high flow. - CCM was consulted and was treated with antibiotics, supportive management - Will need to avoid oversedation - Continue IV ceftriaxone , she will complete 3 days of azithromycin  - Oxygen requirement down to 1 L of oxygen.  Stable to transfer to telemetry - Will hold IV Lasix  for today  Acute COPD exacerbation - Continue with DuoNeb - Will add guaifenesin  - Still having shortness of breath, start Pulmicort  and Brovana    Diarrhea - Resolved  Hypertensive urgency Continue metoprolol  and Norvasc  - Developed low blood pressure hydralazine  was discontinued  GERD - Continue Protonix    Trigeminal neuralgia - Has chronic left eye pain - Continue  oxcarbazepine  Continue pain medication  History of left occipital PCA distribution and stroke: Continue aspirin  and Crestor    Estimated body mass index is 17.34 kg/m as calculated from the following:   Height as of this encounter: 5' 3 (1.6 m).   Weight as of this encounter: 44.4 kg.   DVT prophylaxis: Lovenox  Code Status: Full code Family Communication: Care discussed with patient Disposition Plan:  Status is: Inpatient Remains inpatient appropriate because: Management of pneumonia and electrolyte abnormalities    Consultants:  CCM  Procedures:    Antimicrobials:    Subjective: She still reports shortness of breath and chest tightness, reports cough with thick phlegm, diarrhea has resolved.   Objective: Vitals:   10/06/23 0427 10/06/23 0500 10/06/23 0600 10/06/23 0700  BP:  139/69 (!) 156/60 (!) 114/53  Pulse:  83  80  Resp:  16 (!) 21 15  Temp: 98.3 F (36.8 C)     TempSrc: Axillary     SpO2:  97%  91%  Weight:      Height:        Intake/Output Summary (Last 24 hours) at 10/06/2023 0733 Last data filed at 10/06/2023 9390 Gross per 24 hour  Intake 200 ml  Output 2550 ml  Net -2350 ml   Filed Weights   09/30/23 2108 10/01/23 0513 10/04/23 0600  Weight: 41.3 kg 42.3 kg 44.4 kg    Examination:  General exam: Appears calm and comfortable  Respiratory system: Bilateral rhonchorous respiratory effort normal. Cardiovascular system: S1 & S2 heard, RRR. No JVD, murmurs, rubs, gallops or clicks. No pedal edema. Gastrointestinal system: Abdomen is nondistended, soft and nontender. No organomegaly or masses felt. Normal bowel sounds heard. Central nervous system: Alert and oriented. No focal neurological deficits. Extremities:  Symmetric 5 x 5 power.   Data Reviewed: I have personally reviewed following labs and imaging studies  CBC: Recent Labs  Lab 09/30/23 2128 10/02/23 0343 10/03/23 0758 10/04/23 0901 10/05/23 0309 10/06/23 0310  WBC 4.6 8.2 11.4*  14.1* 10.9* 8.0  NEUTROABS 2.0  --  9.2* 12.3* 9.0* 5.6  HGB 12.7 12.2 11.1* 10.3* 9.7* 9.2*  HCT 36.4 36.9 33.6* 31.3* 30.4* 27.9*  MCV 87.5 92.0 93.3 93.2 93.8 90.0  PLT 309 305 265 213 190 196   Basic Metabolic Panel: Recent Labs  Lab 10/01/23 0633 10/01/23 1533 10/02/23 0343 10/02/23 0409 10/02/23 0954 10/03/23 0758 10/03/23 1541 10/04/23 0901 10/05/23 0309 10/06/23 0310  NA  --  139   < >  --  138 141  --  135 135 132*  K  --  2.8*   < >  --  2.9* 2.5* 2.9* 2.9* 5.1 3.3*  CL  --  105   < >  --  101 99  --  97* 101 92*  CO2  --  23   < >  --  25 27  --  26 22 27   GLUCOSE  --  113*   < >  --  148* 114*  --  122* 116* 104*  BUN  --  <5*   < >  --  <5* <5*  --  7* 8 8  CREATININE  --  0.73   < >  --  0.43* 0.61  --  0.48 0.52 0.40*  CALCIUM   --  8.2*   < >  --  8.7* 8.8*  --  8.8* 8.9 9.0  MG 1.8 1.8  --  1.7  --  1.6*  --  1.9 1.8 1.8  PHOS 2.8 2.9  --   --   --   --   --   --   --   --    < > = values in this interval not displayed.   GFR: Estimated Creatinine Clearance: 49.1 mL/min (A) (by C-G formula based on SCr of 0.4 mg/dL (L)). Liver Function Tests: No results for input(s): AST, ALT, ALKPHOS, BILITOT, PROT, ALBUMIN in the last 168 hours. No results for input(s): LIPASE, AMYLASE in the last 168 hours. No results for input(s): AMMONIA in the last 168 hours. Coagulation Profile: No results for input(s): INR, PROTIME in the last 168 hours. Cardiac Enzymes: No results for input(s): CKTOTAL, CKMB, CKMBINDEX, TROPONINI in the last 168 hours. BNP (last 3 results) No results for input(s): PROBNP in the last 8760 hours. HbA1C: No results for input(s): HGBA1C in the last 72 hours. CBG: No results for input(s): GLUCAP in the last 168 hours. Lipid Profile: No results for input(s): CHOL, HDL, LDLCALC, TRIG, CHOLHDL, LDLDIRECT in the last 72 hours. Thyroid Function Tests: No results for input(s): TSH, T4TOTAL, FREET4,  T3FREE, THYROIDAB in the last 72 hours. Anemia Panel: No results for input(s): VITAMINB12, FOLATE, FERRITIN, TIBC, IRON, RETICCTPCT in the last 72 hours. Sepsis Labs: Recent Labs  Lab 10/03/23 1021  PROCALCITON <0.10    Recent Results (from the past 240 hours)  MRSA Next Gen by PCR, Nasal     Status: None   Collection Time: 10/01/23  4:54 AM   Specimen: Nasal Mucosa; Nasal Swab  Result Value Ref Range Status   MRSA by PCR Next Gen NOT DETECTED NOT DETECTED Final    Comment: (NOTE) The GeneXpert MRSA Assay (FDA approved for NASAL specimens only), is one component of a  comprehensive MRSA colonization surveillance program. It is not intended to diagnose MRSA infection nor to guide or monitor treatment for MRSA infections. Test performance is not FDA approved in patients less than 4 years old. Performed at [facility], [address] [place], [place] [address]   Expectorated Sputum Assessment w Gram Stain, Rflx to Resp Cult     Status: None   Collection Time: 10/04/23  5:16 PM   Specimen: Sputum  Result Value Ref Range Status   Specimen Description SPU  Final   Special Requests NONE  Final   Sputum evaluation   Final    Sputum specimen not acceptable for testing.  Please recollect.   NOTIFIED DEBARAH SAILOR RN AT 8164 ON 10/04/2023 BY PRUDY POUR Performed at [facility], [address] [place], [place] [address]    Report Status 10/04/2023 FINAL  Final         Radiology Studies: No results found.      Scheduled Meds:  amLODipine   10 mg Oral Daily   aspirin  EC  81 mg Oral Daily   azithromycin   500 mg Oral Daily   Chlorhexidine  Gluconate Cloth  6 each Topical Daily   enoxaparin  (LOVENOX ) injection  30 mg Subcutaneous Daily   ipratropium-albuterol   3 mL Nebulization QID   magic mouthwash  5 mL Oral TID   metoprolol  succinate  100 mg Oral Daily   Oxcarbazepine   300 mg Oral BID   pantoprazole   40 mg Oral Daily    pneumococcal 20-valent conjugate vaccine  0.5 mL Intramuscular Tomorrow-1000   potassium chloride   40 mEq Oral BID   rosuvastatin   5 mg Oral Daily   Teriflunomide   7 mg Oral QHS   Continuous Infusions:  cefTRIAXone  (ROCEPHIN )  IV Stopped (10/05/23 1225)     LOS: 5 days    Time spent: 35 minutes    Shawna Rapaport A Curby Carswell, MD [facility]   If 7PM-7AM, please contact night-coverage [email or web address]  10/06/2023, 7:33 AM

## 2023-10-06 NOTE — Evaluation (Signed)
 Clinical/Bedside Swallow Evaluation Patient Details  Name: Shawna Smith MRN: [id number] Date of Birth: 05/15/1955  Today's Date: 10/06/2023 Time: SLP Start Time (ACUTE ONLY): 1525 SLP Stop Time (ACUTE ONLY): 1615 SLP Time Calculation (min) (ACUTE ONLY): 50 min  Past Medical History:  Past Medical History:  Diagnosis Date   Hypertension    MS (multiple sclerosis) (HCC)    Osteoporosis    Past Surgical History:  Past Surgical History:  Procedure Laterality Date   OPEN REDUCTION INTERNAL FIXATION (ORIF) METACARPAL Left 06/18/2018   Procedure: OPEN REDUCTION INTERNAL FIXATION (ORIF) LEFT HAND, MUTIPLE LEFT METACARPAL FRACTURES, AND OPEN REDUCTION OF MCP JOINT OF LEFT INDEX FINGER;  Surgeon: Camella Fallow, MD;  Location: [facility] OR;  Service: Orthopedics;  Laterality: Left;   HPI:  Per Md note 68 yo female with debilitating left trigeminal neuralgia that failed gamma knife surgery 08/10/2023, chronic pain for which she has uptitrated oxcarbazepine , requires narcotics.  Admitted initially for severe hypokalemia and hypomagnesemia. now on 15 L/min.  BiPAP was considered overnight 8/2-8/3 but she could not tolerate it due to left facial pain.  Chest x-ray 8/3 confirmed some left lower lobe atelectasis, significant right middle lobe atelectasis with partial collapse, no other real infiltrates. SHe is being treated for pna with ABX.  Pt with PMH + for MS, trigeminal neuralgia, osteoporosis, HTN, glaucoma, cystitis.  Swallow eval ordered.  She was on HHFNC now on [place] - CCM wants to minize narcotic use, encourage OOB, IS and she is now on a PPI. Prior cervical spine imaging showed straightening of normal lordosis.  Pt reports she was vomiting while sleeping prior to admission - ? if this could contribute to her respiratory issues.    Assessment / Plan / Recommendation  Clinical Impression  Patient demonstrates clinical indication of dysphagia due to her trigeminal neuralgia impacting her mandibular ROM,  mastication ability and ability to conduct dental brushing/dental care.  She was able to feed herself and consume moistened graham crackers, applesauce, Boost Breeze and water. No S/S of aspiration nor oral pocketing.  SLP provided pt with toothbrush/paste and was able to brush her right side of her mouth/teeth without pain. She also rinsed and expectorated warm water after intake.  She reports chewing foods on her right side due to pain. SLP encouraged her to tilt her head to the right if helpful. She admits she does not brush her teeth - encouraged her to try for dental health - as important for removal of biofilm.  Pt eats softer foods at home including Oodles of noodles and puts chicken to soften meat. Encouraged her to order chicken noodle soup with every meal.  She does endorse occasionally having to vomit back up pills  accompanied by frothy secretions - causing SLP to question potential esophageal source of difficulties at times.  She can manage large pills crushed per her statement - recommended she assure approved by her pharmacist.   SLP will follow up x1 to determine if compensations discussed have been helpful for her.  Thanks for this consult. SLP Visit Diagnosis: Dysphagia, oral phase (R13.11)    Aspiration Risk  Mild aspiration risk    Diet Recommendation Regular;Thin liquid    Liquid Administration via: Cup;Straw Medication Administration: Other (Comment) (pt to administer her meds to herself due to her trigeminal neuralgia) Compensations: Slow rate;Small sips/bites (rinse with warm water after meals/po) Postural Changes: Other (Comment)    Other  Recommendations Oral Care Recommendations: Other (Comment)     Assistance Recommended at Discharge  Functional Status Assessment Patient has had a recent decline in their functional status and/or demonstrates limited ability to make significant improvements in function in a reasonable and predictable amount of time  Frequency and  Duration min 1 x/week  1 week       Prognosis Prognosis for improved oropharyngeal function: Good Barriers to Reach Goals: Other (Comment);Time post onset      Swallow Study   General Date of Onset: 10/05/23 HPI: Per Md note 68 yo female with debilitating left trigeminal neuralgia that failed gamma knife surgery 08/10/2023, chronic pain for which she has uptitrated oxcarbazepine , requires narcotics.  Admitted initially for severe hypokalemia and hypomagnesemia. now on 15 L/min.  BiPAP was considered overnight 8/2-8/3 but she could not tolerate it due to left facial pain.  Chest x-ray 8/3 confirmed some left lower lobe atelectasis, significant right middle lobe atelectasis with partial collapse, no other real infiltrates. SHe is being treated for pna with ABX.  Pt with PMH + for MS, trigeminal neuralgia, osteoporosis, HTN, glaucoma, cystitis.  Swallow eval ordered.  She was on HHFNC now on [place] - CCM wants to minize narcotic use, encourage OOB, IS and she is now on a PPI. Prior cervical spine imaging showed straightening of normal lordosis.  Pt reports she was vomiting while sleeping prior to admission - ? if this could contribute to her respiratory issues. Previous Swallow Assessment: none in the chart Diet Prior to this Study: Regular;Thin liquids (Level 0) History of Recent Intubation: No Behavior/Cognition: Alert Oral Care Completed by SLP:  (pt able to use toothbrush to brush the right side of her mouth , does not brush her teeth at home due to pain) Oral Cavity - Dentition: Adequate natural dentition;Other (Comment);Poor condition (pt reports she recently broke a front tooth, appearance of ? decay, can't go to dentist due to pain) Baseline Vocal Quality: Normal Volitional Cough: Other (Comment) (DNT due to her pain) Volitional Swallow: Able to elicit    Oral/Motor/Sensory Function Overall Oral Motor/Sensory Function: Other (comment) (limited mandibular ROM due to her trigeminal  neuralgia)   Ice Chips Ice chips: Not tested   Thin Liquid Thin Liquid: Within functional limits Presentation: Straw    Nectar Thick Nectar Thick Liquid: Not tested   Honey Thick Honey Thick Liquid: Not tested   Puree Puree: Within functional limits Presentation: Self Fed;Spoon   Solid     Solid: Within functional limits Presentation: Spoon;Self Fed Other Comments: pt able to masticate soft foods using her right oral cavity with adequate clearance      Nicolas Emmie Caldron 10/06/2023,4:42 PM  Madelin POUR, MS [facility] SLP Acute Rehab Services Office [phone]

## 2023-10-06 NOTE — Progress Notes (Signed)
 Initial Nutrition Assessment  DOCUMENTATION CODES:   Underweight  INTERVENTION:  -Regular diet.   - Assistance with ordering meals to allow patient to better choose soft foods she can more easily chew. - SLP eval due to patient reporting swallowing difficulty.  - Boost Breeze po TID, each supplement provides 250 kcal and 9 grams of protein - Encourage intake at all meals and of supplements.  - Multivitamin with minerals daily - Monitor weight trends.   NUTRITION DIAGNOSIS:   Underweight related to chronic illness (MS) as evidenced by other (comment) (BMI <18.5).  GOAL:   Patient will meet greater than or equal to 90% of their needs  MONITOR:   PO intake, Supplement acceptance, Weight trends  REASON FOR ASSESSMENT:   Malnutrition Screening Tool (low BMI)    ASSESSMENT:   68 y.o. female with PMH significant for MS, presumed trigeminal neuralgia with uncontrolled pain, HTN who is admitted to the hospital for hypomagnesemia and hypokalemia. Now found to have respiratory failure due to CAP.  Patient eating breakfast at time of visit.  She reports a UBW of 107# and weight loss over the past 2 months since she started having eye pain from her MS.  Per chart review, patient weighed at 107# in November, 120# on 2/3, and then 94# on 2/26. Weight from 2/3 is an ED weight and question if this was a reported weight. She has since been weighed at 91# in March as well as this admission.   Patient reports typically eating 2 meals a day at home. Has continued to eat 2 meals but they have been smaller portions as she endorses chewing foods make her eye pain worse. She has Ensure at home but has not been consuming it.   She reports eating better this admission than she has for several weeks prior. Documented to be consuming 40-50% of meals. She notes her eye pain continues to be worse with chewing. Encouraged intake of soft foods to help alleviate this. Discussed adding assistance to ordering  meals to help patient choose soft foods at meals and patient agreeable. Informed RN of this. Also having some trouble with swallowing, informed MD who ordered SLP eval.  Patient doesn't love traditional milky supplements, but interested in trying [employer]. Encouraged intake of 3 meals a day in addition to supplements.   Admit weight: 91# Current weight: 97# I&O's: -4.6L  Medications reviewed and include: -  Labs reviewed:  Na 132 K+ 3.3   NUTRITION - FOCUSED PHYSICAL EXAM:  Patient began working with RT at end of visit, unable to obtain at this time  Diet Order:   Diet Order             Diet regular Room service appropriate? Yes; Fluid consistency: Thin  Diet effective now                   EDUCATION NEEDS:  Education needs have been addressed  Skin:  Skin Assessment: Reviewed RN Assessment  Last BM:  8/4  Height:  Ht Readings from Last 1 Encounters:  10/01/23 5' 3 (1.6 m)   Weight:  Wt Readings from Last 1 Encounters:  10/04/23 44.4 kg   BMI:  Body mass index is 17.34 kg/m.  Estimated Nutritional Needs:  Kcal:  1650-1850 kcals Protein:  75-90 grams Fluid:  >/= 1.7L    Trude Ned RD, LDN Contact via Secure Chat.

## 2023-10-07 DIAGNOSIS — E876 Hypokalemia: Secondary | ICD-10-CM | POA: Diagnosis not present

## 2023-10-07 LAB — BASIC METABOLIC PANEL WITH GFR
Anion gap: 11 (ref 5–15)
BUN: 8 mg/dL (ref 8–23)
CO2: 27 mmol/L (ref 22–32)
Calcium: 9 mg/dL (ref 8.9–10.3)
Chloride: 95 mmol/L — ABNORMAL LOW (ref 98–111)
Creatinine, Ser: 0.67 mg/dL (ref 0.44–1.00)
GFR, Estimated: >60 mL/min (ref >60)
Glucose, Bld: 156 mg/dL — ABNORMAL HIGH (ref 70–99)
Potassium: 3.4 mmol/L — ABNORMAL LOW (ref 3.5–5.1)
Sodium: 133 mmol/L — ABNORMAL LOW (ref 135–145)

## 2023-10-07 LAB — MAGNESIUM: Magnesium: 1.5 mg/dL — ABNORMAL LOW (ref 1.7–2.4)

## 2023-10-07 MED ORDER — POTASSIUM CHLORIDE CRYS ER 20 MEQ PO TBCR
40.0000 meq | EXTENDED_RELEASE_TABLET | ORAL | Status: AC
  Administered 2023-10-07 (×2): 40 meq via ORAL
  Filled 2023-10-07 (×2): qty 2

## 2023-10-07 MED ORDER — MAGNESIUM OXIDE -MG SUPPLEMENT 400 (240 MG) MG PO TABS
200.0000 mg | ORAL_TABLET | Freq: Two times a day (BID) | ORAL | Status: DC
  Administered 2023-10-07 – 2023-10-08 (×2): 200 mg via ORAL
  Filled 2023-10-07 (×2): qty 1

## 2023-10-07 MED ORDER — MAGNESIUM SULFATE 2 GM/50ML IV SOLN
2.0000 g | Freq: Once | INTRAVENOUS | Status: AC
  Administered 2023-10-07: 2 g via INTRAVENOUS
  Filled 2023-10-07: qty 50

## 2023-10-07 NOTE — Evaluation (Signed)
 Physical Therapy Evaluation Patient Details Name: Shawna Smith MRN: [id number] DOB: 1955-04-17 Today's Date: 10/07/2023  History of Present Illness  Shawna Smith is a 68 y.o. female admitted 09/30/23 with medical history significant for MS, presumed trigeminal neuralgia with uncontrolled pain, hypertension admitted to the hospital for hypomagnesemia and hypokalemia.  Clinical Impression  Pt admitted with above diagnosis. Pt in bed, agreeable to session. States that she lives with her sister who helps with ADLs and upkeep of home. She is able to come to sit EOB without physical assist, HOB elevated. Completes Sit to Stand with RW at supervision assist, reports some light headedness upon standing but remains unchanged throughout, O2 sats >93% throughout. Pt amb x55ft with RW and good demonstration of use. Pt currently with functional limitations due to the deficits listed below (see PT Problem List). Pt will benefit from acute skilled PT to increase their independence and safety with mobility to allow discharge.           If plan is discharge home, recommend the following: Assistance with cooking/housework;Assist for transportation;Help with stairs or ramp for entrance;A little help with bathing/dressing/bathroom;A little help with walking and/or transfers   Can travel by private vehicle        Equipment Recommendations None recommended by PT  Recommendations for Other Services       Functional Status Assessment Patient has had a recent decline in their functional status and demonstrates the ability to make significant improvements in function in a reasonable and predictable amount of time.     Precautions / Restrictions Precautions Precautions: Fall Recall of Precautions/Restrictions: Intact Restrictions Weight Bearing Restrictions Per Provider Order: No      Mobility  Bed Mobility Overal bed mobility: Modified Independent             General bed mobility comments: HOB elevated,  no physical assist required    Transfers Overall transfer level: Needs assistance Equipment used: Rolling walker (2 wheels) Transfers: Sit to/from Stand, Bed to chair/wheelchair/BSC Sit to Stand: Supervision   Step pivot transfers: Supervision       General transfer comment: inc time and use of RW, cues for hand placement    Ambulation/Gait Ambulation/Gait assistance: Contact guard assist Gait Distance (Feet): 60 Feet Assistive device: Rolling walker (2 wheels) Gait Pattern/deviations: Step-through pattern, Wide base of support, Decreased stride length Gait velocity: dec     General Gait Details: lack of clearance during stride bilaterally, inc time for task and dec velocity noted. Pt able to complete wide directional changes, inc use of BUE on RW  Stairs Stairs:  (pt demonstrates functional strength to negotiate steps at home with HR use, cane use, and support from sister although unlikely to need all these in place for successful entry/exit)          Wheelchair Mobility     Tilt Bed    Modified Rankin (Stroke Patients Only)       Balance Overall balance assessment: Needs assistance Sitting-balance support: Feet supported, No upper extremity supported Sitting balance-Leahy Scale: Good     Standing balance support: During functional activity, No upper extremity supported Standing balance-Leahy Scale: Fair                               Pertinent Vitals/Pain Pain Assessment Pain Assessment: 0-10 Pain Score: 9  Pain Location: left facial from trigeminal neuralgia Pain Descriptors / Indicators: Sharp, Throbbing Pain Intervention(s): Limited activity within patient's tolerance,  Monitored during session, Repositioned    Home Living Family/patient expects to be discharged to:: Private residence Living Arrangements: Other relatives Available Help at Discharge: Available 24 hours/day Type of Home: House Home Access: Stairs to enter Entrance  Stairs-Rails: Lawyer of Steps: 2   Home Layout: One level Home Equipment: Agricultural consultant (2 wheels);Rollator (4 wheels);Shower seat;Cane - single point Additional Comments: pt lives with her sister who helps with cooking, cleaning, walking at home    Prior Function                       Extremity/Trunk Assessment   Upper Extremity Assessment Upper Extremity Assessment: Defer to OT evaluation    Lower Extremity Assessment Lower Extremity Assessment: Generalized weakness    Cervical / Trunk Assessment Cervical / Trunk Assessment: Normal  Communication   Communication Communication: No apparent difficulties    Cognition Arousal: Alert Behavior During Therapy: WFL for tasks assessed/performed   PT - Cognitive impairments: No apparent impairments                         Following commands: Intact       Cueing Cueing Techniques: Verbal cues, Gestural cues     General Comments General comments (skin integrity, edema, etc.): pre session HR 107, O2 93%. post session HR 94, O2 94% on 2.5-3L/min    Exercises     Assessment/Plan    PT Assessment Patient needs continued PT services  PT Problem List Decreased strength;Decreased cognition;Decreased balance;Decreased mobility;Decreased activity tolerance       PT Treatment Interventions DME instruction;Functional mobility training;Balance training;Patient/family education;Gait training;Therapeutic activities;Stair training;Therapeutic exercise    PT Goals (Current goals can be found in the Care Plan section)  Acute Rehab PT Goals Patient Stated Goal: improve strength and decrease pain PT Goal Formulation: With patient Time For Goal Achievement: 10/21/23 Potential to Achieve Goals: Good    Frequency Min 3X/week     Co-evaluation               AM-PAC PT 6 Clicks Mobility  Outcome Measure Help needed turning from your back to your side while in a flat bed without  using bedrails?: A Little Help needed moving from lying on your back to sitting on the side of a flat bed without using bedrails?: A Little Help needed moving to and from a bed to a chair (including a wheelchair)?: A Little Help needed standing up from a chair using your arms (e.g., wheelchair or bedside chair)?: A Little Help needed to walk in hospital room?: A Little Help needed climbing 3-5 steps with a railing? : A Little 6 Click Score: 18    End of Session Equipment Utilized During Treatment: Gait belt;Oxygen Activity Tolerance: Patient tolerated treatment well Patient left: in chair;with call bell/phone within reach;with chair alarm set Nurse Communication: Mobility status PT Visit Diagnosis: Difficulty in walking, not elsewhere classified (R26.2);Muscle weakness (generalized) (M62.81)    Time: 9072-8994 PT Time Calculation (min) (ACUTE ONLY): 38 min   Charges:   PT Evaluation $PT Eval Low Complexity: 1 Low PT Treatments $Gait Training: 8-22 mins $Therapeutic Activity: 8-22 mins PT General Charges $$ ACUTE PT VISIT: 1 Visit         Stann, PT Acute Rehabilitation Services Office: [phone] 10/07/2023   Stann DELENA Ohara 10/07/2023, 10:16 AM

## 2023-10-07 NOTE — Evaluation (Signed)
 Occupational Therapy Evaluation Patient Details Name: Shawna Smith MRN: [id number] DOB: 09/23/1955 Today's Date: 10/07/2023   History of Present Illness   Shawna Smith is a 68 y.o. female admitted 09/30/23 with medical history significant for MS, presumed trigeminal neuralgia with uncontrolled pain, hypertension admitted to the hospital for hypomagnesemia and hypokalemia.     Clinical Impressions Pt presents with decline in function and safety with ADLs and ADL mobility with impaired strength, balance and endurance. PTA pt lives with sister (her primary caretaker) and sister assists with LB ADLs, pt Ind with grooming/hygiene and toileting, can prep light meals/snacks.uses cane or RW, reports wall surfing. Pt's sister also helps with cooking, cleaning, walking at home, provides transportation. Pt currently requires min A/CGA with LB ADLs, CGA/Sup with mobility/transfers using RW, CGA with toileting tasks. OT will follow acutely to maximize level of function and safety      If plan is discharge home, recommend the following:   A little help with bathing/dressing/bathroom;A little help with walking and/or transfers;Assistance with cooking/housework;Assist for transportation;Help with stairs or ramp for entrance     Functional Status Assessment   Patient has had a recent decline in their functional status and demonstrates the ability to make significant improvements in function in a reasonable and predictable amount of time.     Equipment Recommendations   None recommended by OT     Recommendations for Other Services         Precautions/Restrictions   Precautions Precautions: Fall Recall of Precautions/Restrictions: Intact Restrictions Weight Bearing Restrictions Per Provider Order: No     Mobility Bed Mobility               General bed mobility comments: pt in chair    Transfers Overall transfer level: Needs assistance Equipment used: Rolling walker (2  wheels) Transfers: Sit to/from Stand, Bed to chair/wheelchair/BSC Sit to Stand: Contact guard assist, Supervision     Step pivot transfers: Supervision     General transfer comment: increased time and use of RW, cues for hand placement      Balance Overall balance assessment: Needs assistance Sitting-balance support: Feet supported, No upper extremity supported Sitting balance-Leahy Scale: Good     Standing balance support: During functional activity, No upper extremity supported Standing balance-Leahy Scale: Fair                             ADL either performed or assessed with clinical judgement   ADL Overall ADL's : Needs assistance/impaired Eating/Feeding: Independent Eating/Feeding Details (indicate cue type and reason): difficultly with intake due to nerve pain Grooming: Wash/dry hands;Wash/dry face;Oral care;Contact guard assist;Supervision/safety;Set up;Standing;Sitting   Upper Body Bathing: Set up;Supervision/ safety   Lower Body Bathing: Minimal assistance;Contact guard assist;Sit to/from stand   Upper Body Dressing : Set up;Supervision/safety   Lower Body Dressing: Minimal assistance;Contact guard assist;Sit to/from stand   Toilet Transfer: Contact guard assist;Supervision/safety;Ambulation;Rolling walker (2 wheels);Cueing for safety   Toileting- Clothing Manipulation and Hygiene: Contact guard assist;Sit to/from stand       Functional mobility during ADLs: Contact guard assist;Supervision/safety;Rolling walker (2 wheels);Cueing for safety       Vision Baseline Vision/History: 1 Wears glasses Ability to See in Adequate Light: 0 Adequate Patient Visual Report: No change from baseline       Perception         Praxis         Pertinent Vitals/Pain Pain Assessment Pain Assessment: Faces Faces Pain Scale:  Hurts whole lot Pain Location: left facial from trigeminal neuralgia Pain Descriptors / Indicators: Sharp, Throbbing Pain  Intervention(s): Limited activity within patient's tolerance, Monitored during session, Repositioned     Extremity/Trunk Assessment Upper Extremity Assessment Upper Extremity Assessment: Generalized weakness   Lower Extremity Assessment Lower Extremity Assessment: Defer to PT evaluation   Cervical / Trunk Assessment Cervical / Trunk Assessment: Normal   Communication Communication Communication: No apparent difficulties   Cognition Arousal: Alert Behavior During Therapy: WFL for tasks assessed/performed Cognition: No apparent impairments                               Following commands: Intact       Cueing  General Comments   Cueing Techniques: Verbal cues  pre session HR 107, O2 93%. post session HR 94, O2 94% on 2.5-3L/min   Exercises     Shoulder Instructions      Home Living Family/patient expects to be discharged to:: Private residence Living Arrangements: Other relatives Available Help at Discharge: Available 24 hours/day Type of Home: House Home Access: Stairs to enter [employer] of Steps: 2 Entrance Stairs-Rails: Left;Right Home Layout: One level     Bathroom Shower/Tub: Chief Strategy Officer: Standard Bathroom Accessibility: Yes   Home Equipment: Agricultural consultant (2 wheels);Rollator (4 wheels);Shower seat;Cane - single point   Additional Comments: pt lives with her sister who helps with cooking, cleaning, walking at home, provides transportation      Prior Functioning/Environment Prior Level of Function : Independent/Modified Independent             Mobility Comments: uses cane or RW, reports wall surfing ADLs Comments: sister assists with LB ADLs, pt Ind with grooming/hygiene and toileting, can prep light meals/snacks.    OT Problem List: Impaired balance (sitting and/or standing);Decreased activity tolerance;Pain   OT Treatment/Interventions: Self-care/ADL training;Patient/family education;Therapeutic  exercise;Therapeutic activities;DME and/or AE instruction;Energy conservation      OT Goals(Current goals can be found in the care plan section)   Acute Rehab OT Goals Patient Stated Goal: go home OT Goal Formulation: With patient Time For Goal Achievement: 10/21/23 Potential to Achieve Goals: Good ADL Goals Pt Will Perform Grooming: with supervision;with set-up;standing Pt Will Perform Upper Body Bathing: with set-up;with modified independence;sitting Pt Will Perform Lower Body Bathing: with contact guard assist;with caregiver independent in assisting;sit to/from stand Pt Will Perform Upper Body Dressing: with set-up;with modified independence;sitting Pt Will Perform Lower Body Dressing: with contact guard assist;with caregiver independent in assisting;sit to/from stand Pt Will Transfer to Toilet: with supervision;with modified independence;ambulating Pt Will Perform Toileting - Clothing Manipulation and hygiene: with supervision;with modified independence;sitting/lateral leans;sit to/from stand Pt Will Perform Tub/Shower Transfer: with contact guard assist;with supervision;ambulating;with caregiver independent in assisting;rolling walker   OT Frequency:  Min 2X/week    Co-evaluation              AM-PAC OT 6 Clicks Daily Activity     Outcome Measure Help from another person eating meals?: None Help from another person taking care of personal grooming?: A Little Help from another person toileting, which includes using toliet, bedpan, or urinal?: A Little Help from another person bathing (including washing, rinsing, drying)?: A Little Help from another person to put on and taking off regular upper body clothing?: A Little Help from another person to put on and taking off regular lower body clothing?: A Little 6 Click Score: 19   End of Session  Equipment Utilized During Treatment: Gait belt;Rolling walker (2 wheels) Nurse Communication: Mobility status  Activity Tolerance:  Patient tolerated treatment well Patient left: in chair;with call bell/phone within reach;with chair alarm set  OT Visit Diagnosis: Unsteadiness on feet (R26.81);Other abnormalities of gait and mobility (R26.89);Pain Pain - Right/Left: Left Pain - part of body:  (facial/jaw area)                Time: 8992-8966 OT Time Calculation (min): 26 min Charges:  OT Evaluation $OT Eval Low Complexity: 1 Low OT Treatments $Therapeutic Activity: 8-22 mins    Jacques Karna Loose 10/07/2023, 10:48 AM

## 2023-10-07 NOTE — Progress Notes (Signed)
 PROGRESS NOTE    Shawna Smith  FMW:[id number] DOB: 12-Sep-1955 DOA: 09/30/2023 PCP: Tonuzi, Lirim, MD   Brief Narrative: 68 year old with past medical history significant for MS, presumed trigeminal neuralgia with uncontrolled pain, hypertension presents to the hospital with hypomagnesemia, hypokalemia.  Who had gamma knife surgery 08/13/2023 for trigeminal neuralgia unfortunately without improvement.  Her medication has been uptitrating (oxcarbazepine ).  She saw her PCP on 7/30, lab work noted significant hypokalemia hypomagnesemia she was advised to present to the ED.  Patient was admitted for electrolytes abnormality however she developed pneumonia, acute hypoxic respiratory failure.  Critical care was consulted.     Assessment & Plan:   Principal Problem:   Hypokalemia   1-Severe electrolyte abnormality, hypokalemia hypomagnesemia -Continue with supplement.  -Monitor labs daily  -in setting of diarrhea.  Replete Mg and K today   Acute hypoxic respiratory failure in the setting of community-acquired pneumonia - Patient was noted to require oxygen supplementation on 8/2 night, chest x-ray 80/3 showed possible bilateral pneumonia. - She was treated with IV antibiotics and received IV Lasix .  She required 30 L of oxygen high flow. - CCM was consulted and was treated with antibiotics, supportive management - Will need to avoid oversedation - Continue IV ceftriaxone , she will complete 3 days of azithromycin  - Oxygen requirement down to 1 L of oxygen.   Wean of oxygen as tolerated. Report breathing better.    Acute COPD exacerbation - Continue with DuoNeb - Continue with  guaifenesin  - Continue with  Pulmicort  and Brovana    Diarrhea - Resolved  Hypertensive urgency Continue metoprolol  and Norvasc  - Developed low blood pressure hydralazine  was discontinued  GERD - Continue Protonix    Trigeminal neuralgia - Has chronic left eye pain - Continue oxcarbazepine  Continue pain  medication  History of left occipital PCA distribution and stroke: Continue aspirin  and Crestor    Estimated body mass index is 17.34 kg/m as calculated from the following:   Height as of this encounter: 5' 3 (1.6 m).   Weight as of this encounter: 44.4 kg.   DVT prophylaxis: Lovenox  Code Status: Full code Family Communication: Care discussed with patient Disposition Plan:  Status is: Inpatient Remains inpatient appropriate because: Management of pneumonia and electrolyte abnormalities    Consultants:  CCM  Procedures:    Antimicrobials:    Subjective: Facial pain stable, pain meds help. Denies diarrhea.  Breathing better.   Objective: Vitals:   10/06/23 1954 10/06/23 2001 10/07/23 0437 10/07/23 1227  BP:  (!) 153/80 (!) 158/84 (!) 140/74  Pulse:  79 84 75  Resp:  18 18   Temp:  98.3 F (36.8 C) 98.3 F (36.8 C) 97.9 F (36.6 C)  TempSrc:  Oral Oral   SpO2: 97%     Weight:      Height:        Intake/Output Summary (Last 24 hours) at 10/07/2023 1547 Last data filed at 10/07/2023 1135 Gross per 24 hour  Intake 240 ml  Output 200 ml  Net 40 ml   Filed Weights   09/30/23 2108 10/01/23 0513 10/04/23 0600  Weight: 41.3 kg 42.3 kg 44.4 kg    Examination:  General exam: NAD Respiratory system: BL ronchus.  Cardiovascular system: S 1, S 2 RRR Gastrointestinal system: BS present, soft nt Central nervous system: Alert Extremities: no edema.    Data Reviewed: I have personally reviewed following labs and imaging studies  CBC: Recent Labs  Lab 09/30/23 2128 10/02/23 0343 10/03/23 0758 10/04/23 0901 10/05/23 0309 10/06/23  0310  WBC 4.6 8.2 11.4* 14.1* 10.9* 8.0  NEUTROABS 2.0  --  9.2* 12.3* 9.0* 5.6  HGB 12.7 12.2 11.1* 10.3* 9.7* 9.2*  HCT 36.4 36.9 33.6* 31.3* 30.4* 27.9*  MCV 87.5 92.0 93.3 93.2 93.8 90.0  PLT 309 305 265 213 190 196   Basic Metabolic Panel: Recent Labs  Lab 10/01/23 0633 10/01/23 1533 10/02/23 0343 10/03/23 0758  10/03/23 1541 10/04/23 0901 10/05/23 0309 10/06/23 0310 10/07/23 1133  NA  --  139   < > 141  --  135 135 132* 133*  K  --  2.8*   < > 2.5* 2.9* 2.9* 5.1 3.3* 3.4*  CL  --  105   < > 99  --  97* 101 92* 95*  CO2  --  23   < > 27  --  26 22 27 27   GLUCOSE  --  113*   < > 114*  --  122* 116* 104* 156*  BUN  --  <5*   < > <5*  --  7* 8 8 8   CREATININE  --  0.73   < > 0.61  --  0.48 0.52 0.40* 0.67  CALCIUM   --  8.2*   < > 8.8*  --  8.8* 8.9 9.0 9.0  MG 1.8 1.8   < > 1.6*  --  1.9 1.8 1.8 1.5*  PHOS 2.8 2.9  --   --   --   --   --   --   --    < > = values in this interval not displayed.   GFR: Estimated Creatinine Clearance: 49.1 mL/min (by C-G formula based on SCr of 0.67 mg/dL). Liver Function Tests: No results for input(s): AST, ALT, ALKPHOS, BILITOT, PROT, ALBUMIN in the last 168 hours. No results for input(s): LIPASE, AMYLASE in the last 168 hours. No results for input(s): AMMONIA in the last 168 hours. Coagulation Profile: No results for input(s): INR, PROTIME in the last 168 hours. Cardiac Enzymes: No results for input(s): CKTOTAL, CKMB, CKMBINDEX, TROPONINI in the last 168 hours. BNP (last 3 results) No results for input(s): PROBNP in the last 8760 hours. HbA1C: No results for input(s): HGBA1C in the last 72 hours. CBG: No results for input(s): GLUCAP in the last 168 hours. Lipid Profile: No results for input(s): CHOL, HDL, LDLCALC, TRIG, CHOLHDL, LDLDIRECT in the last 72 hours. Thyroid Function Tests: No results for input(s): TSH, T4TOTAL, FREET4, T3FREE, THYROIDAB in the last 72 hours. Anemia Panel: No results for input(s): VITAMINB12, FOLATE, FERRITIN, TIBC, IRON, RETICCTPCT in the last 72 hours. Sepsis Labs: Recent Labs  Lab 10/03/23 1021  PROCALCITON <0.10    Recent Results (from the past 240 hours)  MRSA Next Gen by PCR, Nasal     Status: None   Collection Time: 10/01/23  4:54 AM    Specimen: Nasal Mucosa; Nasal Swab  Result Value Ref Range Status   MRSA by PCR Next Gen NOT DETECTED NOT DETECTED Final    Comment: (NOTE) The GeneXpert MRSA Assay (FDA approved for NASAL specimens only), is one component of a comprehensive MRSA colonization surveillance program. It is not intended to diagnose MRSA infection nor to guide or monitor treatment for MRSA infections. Test performance is not FDA approved in patients less than 31 years old. Performed at [facility], [address], [place], [place] [address]   Expectorated Sputum Assessment w Gram Stain, Rflx to Resp Cult     Status: None  Collection Time: 10/04/23  5:16 PM   Specimen: Sputum  Result Value Ref Range Status   Specimen Description SPU  Final   Special Requests NONE  Final   Sputum evaluation   Final    Sputum specimen not acceptable for testing.  Please recollect.   NOTIFIED DEBARAH SAILOR RN AT 8164 ON 10/04/2023 BY PRUDY POUR Performed at [facility], [address], [place], [place] [address]    Report Status 10/04/2023 FINAL  Final         Radiology Studies: No results found.      Scheduled Meds:  amLODipine   10 mg Oral Daily   arformoterol   15 mcg Nebulization BID   aspirin  EC  81 mg Oral Daily   budesonide  (PULMICORT ) nebulizer solution  0.25 mg Nebulization BID   Chlorhexidine  Gluconate Cloth  6 each Topical Daily   enoxaparin  (LOVENOX ) injection  30 mg Subcutaneous Daily   feeding supplement  1 Container Oral TID BM   guaiFENesin   1,200 mg Oral BID   ipratropium-albuterol   3 mL Nebulization QID   magic mouthwash  5 mL Oral TID   magnesium  oxide  200 mg Oral BID   metoprolol  succinate  100 mg Oral Daily   multivitamin with minerals  1 tablet Oral Daily   Oxcarbazepine   300 mg Oral BID   pantoprazole   40 mg Oral Daily   potassium chloride   40 mEq Oral Q4H   rosuvastatin   5 mg Oral Daily   Teriflunomide   7 mg Oral QHS   Continuous Infusions:   cefTRIAXone  (ROCEPHIN )  IV 1 g (10/07/23 1129)   magnesium  sulfate bolus IVPB       LOS: 6 days    Time spent: 35 minutes    Dwyne Hasegawa A Tayquan Gassman, MD [facility]   If 7PM-7AM, please contact night-coverage [email or web address]  10/07/2023, 3:47 PM

## 2023-10-08 ENCOUNTER — Other Ambulatory Visit (HOSPITAL_COMMUNITY): Payer: Self-pay

## 2023-10-08 DIAGNOSIS — E876 Hypokalemia: Secondary | ICD-10-CM | POA: Diagnosis not present

## 2023-10-08 LAB — BASIC METABOLIC PANEL WITH GFR
Anion gap: 10 (ref 5–15)
BUN: 9 mg/dL (ref 8–23)
CO2: 25 mmol/L (ref 22–32)
Calcium: 9.1 mg/dL (ref 8.9–10.3)
Chloride: 97 mmol/L — ABNORMAL LOW (ref 98–111)
Creatinine, Ser: 0.61 mg/dL (ref 0.44–1.00)
GFR, Estimated: >60 mL/min (ref >60)
Glucose, Bld: 174 mg/dL — ABNORMAL HIGH (ref 70–99)
Potassium: 4.1 mmol/L (ref 3.5–5.1)
Sodium: 132 mmol/L — ABNORMAL LOW (ref 135–145)

## 2023-10-08 LAB — MAGNESIUM: Magnesium: 2.2 mg/dL (ref 1.7–2.4)

## 2023-10-08 MED ORDER — CEFDINIR 300 MG PO CAPS
300.0000 mg | ORAL_CAPSULE | Freq: Two times a day (BID) | ORAL | 0 refills | Status: AC
  Filled 2023-10-08: qty 4, 2d supply, fill #0

## 2023-10-08 MED ORDER — OXCARBAZEPINE 300 MG PO TABS
300.0000 mg | ORAL_TABLET | Freq: Two times a day (BID) | ORAL | 0 refills | Status: AC
  Filled 2023-10-08: qty 60, 30d supply, fill #0

## 2023-10-08 MED ORDER — MAGNESIUM OXIDE -MG SUPPLEMENT 400 (240 MG) MG PO TABS
200.0000 mg | ORAL_TABLET | Freq: Once | ORAL | Status: AC
  Administered 2023-10-08: 200 mg via ORAL
  Filled 2023-10-08: qty 1

## 2023-10-08 MED ORDER — ALBUTEROL SULFATE HFA 108 (90 BASE) MCG/ACT IN AERS
2.0000 | INHALATION_SPRAY | Freq: Three times a day (TID) | RESPIRATORY_TRACT | 0 refills | Status: AC | PRN
  Filled 2023-10-08: qty 6.7, 30d supply, fill #0

## 2023-10-08 MED ORDER — ADULT MULTIVITAMIN W/MINERALS CH
1.0000 | ORAL_TABLET | Freq: Every day | ORAL | 0 refills | Status: AC
  Filled 2023-10-08: qty 30, 30d supply, fill #0

## 2023-10-08 MED ORDER — AMLODIPINE BESYLATE 10 MG PO TABS
10.0000 mg | ORAL_TABLET | Freq: Every day | ORAL | 0 refills | Status: AC
  Filled 2023-10-08: qty 30, 30d supply, fill #0

## 2023-10-08 MED ORDER — MAGNESIUM OXIDE -MG SUPPLEMENT 400 (240 MG) MG PO TABS
400.0000 mg | ORAL_TABLET | Freq: Two times a day (BID) | ORAL | 0 refills | Status: AC
  Filled 2023-10-08: qty 30, 15d supply, fill #0

## 2023-10-08 MED ORDER — OXYCODONE HCL 5 MG PO TABS
5.0000 mg | ORAL_TABLET | ORAL | 0 refills | Status: AC | PRN
  Filled 2023-10-08: qty 5, 1d supply, fill #0

## 2023-10-08 MED ORDER — MAGNESIUM OXIDE -MG SUPPLEMENT 400 (240 MG) MG PO TABS: 400.0000 mg | ORAL_TABLET | Freq: Two times a day (BID) | ORAL | Status: DC

## 2023-10-08 MED ORDER — GUAIFENESIN ER 600 MG PO TB12
1200.0000 mg | ORAL_TABLET | Freq: Two times a day (BID) | ORAL | 0 refills | Status: AC
  Filled 2023-10-08: qty 20, 5d supply, fill #0

## 2023-10-08 MED ORDER — IPRATROPIUM-ALBUTEROL 0.5-2.5 (3) MG/3ML IN SOLN
3.0000 mL | Freq: Four times a day (QID) | RESPIRATORY_TRACT | 0 refills | Status: AC
  Filled 2023-10-08: qty 180, 15d supply, fill #0

## 2023-10-08 MED ORDER — MAGNESIUM SULFATE 2 GM/50ML IV SOLN
2.0000 g | Freq: Once | INTRAVENOUS | Status: AC
Start: 2023-10-08 — End: 2023-10-08
  Administered 2023-10-08: 2 g via INTRAVENOUS
  Filled 2023-10-08: qty 50

## 2023-10-08 MED ORDER — POTASSIUM CHLORIDE CRYS ER 20 MEQ PO TBCR
40.0000 meq | EXTENDED_RELEASE_TABLET | Freq: Every day | ORAL | Status: DC
  Administered 2023-10-08: 40 meq via ORAL
  Filled 2023-10-08: qty 2

## 2023-10-08 NOTE — Progress Notes (Addendum)
 Speech Language Pathology Treatment: Dysphagia  Patient Details Name: Shawna Smith MRN: [id number] DOB: August 07, 1955 Today's Date: 10/08/2023 Time: 8993-8982 SLP Time Calculation (min) (ACUTE ONLY): 11 min  Assessment / Plan / Recommendation Clinical Impression  Pt seen at bedside for skilled ST intervention targeting goals for education re: compensatory strategies. SLP reviewed education provided during BSE, including encouraging pt to brush her teeth. Pt reports focusing on the right side, as the left is so painful. SLP encouraged attention to left as much as possible to minimize bacteria. Signs placed in room encouraging ordering noodle soup. Pt verbalized awareness of the need to tilt her head to the right during PO intake, and indicated strategies are helpful.. SLP provided education re: management of esophageal strategies to encourage esophageal clearing. ST to sign off at this time. Please reconsult if needs arise.    HPI HPI: Per Md note 68 yo female with debilitating left trigeminal neuralgia that failed gamma knife surgery 08/10/2023, chronic pain for which she has uptitrated oxcarbazepine , requires narcotics.  Admitted initially for severe hypokalemia and hypomagnesemia. now on 15 L/min.  BiPAP was considered overnight 8/2-8/3 but she could not tolerate it due to left facial pain.  Chest x-ray 8/3 confirmed some left lower lobe atelectasis, significant right middle lobe atelectasis with partial collapse, no other real infiltrates. SHe is being treated for pna with ABX.  Pt with PMH + for MS, trigeminal neuralgia, osteoporosis, HTN, glaucoma, cystitis.  Swallow eval ordered.  She was on HHFNC now on [place] - CCM wants to minize narcotic use, encourage OOB, IS and she is now on a PPI. Prior cervical spine imaging showed straightening of normal lordosis.  Pt reports she was vomiting while sleeping prior to admission - ? if this could contribute to her respiratory issues.      SLP Plan  Discharge  SLP treatment due to goals met          Recommendations  Diet recommendations: Regular;Thin liquid Liquids provided via: Cup;Straw Medication Administration: Other (Comment) (pt to self administer meds due to trigeminal neuralgia) Supervision: Patient able to self feed Compensations: Slow rate;Small sips/bites Postural Changes and/or Swallow Maneuvers: Upright 30-60 min after meal;Seated upright 90 degrees             (PRN) Dysphagia, oral phase (R13.11)     Discharge SLP treatment due to goals met    Shawna Smith B. Shawna, MSP, CCC-SLP Speech Language Pathologist  Shawna Smith 10/08/2023, 10:21 AM

## 2023-10-08 NOTE — Progress Notes (Signed)
 Discharge meds in a secure bag delivered to patient in room by this RN

## 2023-10-08 NOTE — Progress Notes (Signed)
 Occupational Therapy Treatment Patient Details Name: Shawna Smith MRN: [id number] DOB: 09/07/1955 Today's Date: 10/08/2023   History of present illness Shawna Smith is a 68 yr old female admitted 09/30/23 with medical history significant for MS, presumed trigeminal neuralgia with uncontrolled pain, hypertension admitted to the hospital for hypomagnesemia and hypokalemia.   OT comments  The pt was seen for progression of ADL participation, functional strengthening, and ADL instruction. She required SBA to CGA for tasks, including sit to stand using a RW, toileting at bathroom level, and upper body grooming in standing at the sink. She reported having 8/10 facial pain and feelings of slight generalized weakness. She stated her sister is her caregiver & is able to assist her as needed at home. Continue OT plan of care. Home health OT is recommended.       If plan is discharge home, recommend the following:  A little help with bathing/dressing/bathroom;A little help with walking and/or transfers;Assistance with cooking/housework;Assist for transportation;Help with stairs or ramp for entrance   Equipment Recommendations  None recommended by OT    Recommendations for Other Services      Precautions / Restrictions Precautions Precautions: Fall Restrictions Weight Bearing Restrictions Per Provider Order: No       Mobility Bed Mobility Overal bed mobility: Needs Assistance, Modified Independent Bed Mobility: Supine to Sit, Sit to Supine     Supine to sit: Modified independent (Device/Increase time), Used rails Sit to supine: Modified independent (Device/Increase time)        Transfers Overall transfer level: Needs assistance Equipment used: Rolling walker (2 wheels) Transfers: Sit to/from Stand Sit to Stand: Contact guard assist            Balance     Sitting balance-Leahy Scale: Good       Standing balance-Leahy Scale: Fair            ADL either performed or assessed  with clinical judgement   ADL Overall ADL's : Needs assistance/impaired     Grooming: Contact guard assist;Standing;Bed level;Set up Grooming Details (indicate cue type and reason): The pt performed hand washing in standing at sink level with SBA to occasional light CGA. She further requested to perform further grooming at bed level; she subsequently performed teeth brushing while long-sitting in bed.               Lower Body Dressing Details (indicate cue type and reason): The pt reported her sister assists her with lower body dressing at her baseline. Specifically, her sister assists with donning and doffing her shoes, socks, and lower body clothing articles. Toilet Transfer: Contact guard assist;Rolling walker (2 wheels);Ambulation;Grab bars Toilet Transfer Details (indicate cue type and reason): She ambulated to & from the bathroom in her room using a RW. She transferred onto and off the toilet, requiring light steadying assist and use of grab bar for added support. Toileting- Clothing Manipulation and Hygiene: Contact guard assist;Sit to/from stand Toileting - Clothing Manipulation Details (indicate cue type and reason): The pt had a bowel movement & was able to perform posterior peri-hygiene in sitting with SBA assist. She required intermittent CGA while she stood to pull her underwear up over her hips.                      Communication Communication Communication: No apparent difficulties   Cognition Arousal: Alert Behavior During Therapy: WFL for tasks assessed/performed Cognition: No apparent impairments        Following commands: Intact  Pertinent Vitals/ Pain       Pain Assessment Pain Assessment: 0-10 Pain Score: 8  Pain Location: left facial from trigeminal neuralgia Pain Intervention(s): Monitored during session, Limited activity within patient's tolerance   Frequency  Min 2X/week        Progress Toward Goals  OT  Goals(current goals can now be found in the care plan section)  Progress towards OT goals: Progressing toward goals  Acute Rehab OT Goals Patient Stated Goal: to get some rest and to return home soon OT Goal Formulation: With patient Time For Goal Achievement: 10/21/23 Potential to Achieve Goals: Good  Plan         AM-PAC OT 6 Clicks Daily Activity     Outcome Measure   Help from another person eating meals?: None Help from another person taking care of personal grooming?: A Little Help from another person toileting, which includes using toliet, bedpan, or urinal?: A Little Help from another person bathing (including washing, rinsing, drying)?: None Help from another person to put on and taking off regular upper body clothing?: A Little Help from another person to put on and taking off regular lower body clothing?: A Little 6 Click Score: 20    End of Session Equipment Utilized During Treatment: Gait belt;Rolling walker (2 wheels)  OT Visit Diagnosis: Unsteadiness on feet (R26.81);Pain Pain - Right/Left: Left Pain - part of body:  (face)   Activity Tolerance Patient tolerated treatment well   Patient Left in bed;with call bell/phone within reach;with bed alarm set   Nurse Communication Other (comment) (pt requesting to get a few hours of rest)        Time: 8971-8948 OT Time Calculation (min): 23 min  Charges: OT General Charges $OT Visit: 1 Visit OT Treatments $Self Care/Home Management : 8-22 mins $Therapeutic Activity: 8-22 mins     Shawna Smith, OTR/L 10/08/2023, 1:32 PM

## 2023-10-08 NOTE — Progress Notes (Signed)
 SATURATION QUALIFICATIONS: (This note is used to comply with regulatory documentation for home oxygen)  Patient Saturations on Room Air at Rest = 99 %  Patient Saturations on Room Air while Ambulating = 98 %

## 2023-10-08 NOTE — Plan of Care (Signed)
  Problem: Education: Goal: Knowledge of General Education information will improve Description: Including pain rating scale, medication(s)/side effects and non-pharmacologic comfort measures Outcome: Progressing   Problem: Health Behavior/Discharge Planning: Goal: Ability to manage health-related needs will improve Outcome: Progressing   Problem: Clinical Measurements: Goal: Ability to maintain clinical measurements within normal limits will improve Outcome: Progressing Goal: Will remain free from infection Outcome: Progressing Goal: Diagnostic test results will improve Outcome: Progressing Goal: Respiratory complications will improve Outcome: Progressing Goal: Cardiovascular complication will be avoided Outcome: Progressing   Problem: Activity: Goal: Risk for activity intolerance will decrease Outcome: Progressing   Problem: Coping: Goal: Level of anxiety will decrease Outcome: Progressing   Problem: Elimination: Goal: Will not experience complications related to bowel motility Outcome: Progressing Goal: Will not experience complications related to urinary retention Outcome: Progressing   Problem: Safety: Goal: Ability to remain free from injury will improve Outcome: Progressing   Problem: Skin Integrity: Goal: Risk for impaired skin integrity will decrease Outcome: Progressing   Problem: Nutrition: Goal: Adequate nutrition will be maintained Outcome: Not Progressing   Problem: Pain Managment: Goal: General experience of comfort will improve and/or be controlled Outcome: Not Progressing

## 2023-10-08 NOTE — Progress Notes (Signed)
 PT Cancellation Note  Patient Details Name: [name] MRN: [id number] DOB: 01-03-1956   Cancelled Treatment:    Reason Eval/Treat Not Completed: Other (comment) RN reports she already ambulated with pt and checked saturations.  Pt set to d/c today.   Kati L Payson 10/08/2023, 1:16 PM

## 2023-10-08 NOTE — Discharge Summary (Addendum)
 Physician Discharge Summary   Patient: Shawna Smith MRN: [id number] DOB: 1955-11-01  Admit date:     09/30/2023  Discharge date: 10/08/23  Discharge Physician: Owen DELENA Lore   PCP: Tonuzi, Lirim, MD   Recommendations at discharge:    Follow up with PCP for resolution of PNA.  Follow up with neurologist for trigeminal neuralgia management.  Needs Bmet to follow up Electrolytes.   Discharge Diagnoses: Principal Problem:   Hypokalemia  Resolved Problems:   * No resolved hospital problems. *  Hospital Course: 68 year old with past medical history significant for MS, presumed trigeminal neuralgia with uncontrolled pain, hypertension presents to the hospital with hypomagnesemia, hypokalemia. Who had gamma knife surgery 08/13/2023 for trigeminal neuralgia unfortunately without improvement. Her medication has been uptitrating (oxcarbazepine ). She saw her PCP on 7/30, lab work noted significant hypokalemia hypomagnesemia she was advised to present to the ED. Patient was admitted for electrolytes abnormality however she developed pneumonia, acute hypoxic respiratory failure. Critical care was consulted.   Assessment and Plan: 1-Severe electrolyte abnormality, hypokalemia hypomagnesemia -Continue with supplement.  -Monitor labs daily  -in setting of diarrhea.  Replete Mg and K today    Acute hypoxic respiratory failure in the setting of community-acquired pneumonia - Patient was noted to require oxygen supplementation on 8/2 night, chest x-ray 80/3 showed possible bilateral pneumonia. - She was treated with IV antibiotics and received IV Lasix .  She required 30 L of oxygen high flow. - CCM was consulted and was treated with antibiotics, supportive management - Will need to avoid oversedation - Continue IV ceftriaxone , she will complete 3 days of azithromycin  Discharge on Cefdinir .  On room air, styable for discharge   Acute COPD exacerbation - Continue with DuoNeb - Continue with   guaifenesin  -     Diarrhea - Resolved   Hypertensive urgency Continue metoprolol  and Norvasc  - Developed low blood pressure hydralazine  was discontinued   GERD - Continue Protonix      Trigeminal neuralgia - Has chronic left eye pain - Continue oxcarbazepine  Continue pain medication   History of left occipital PCA distribution and stroke: Continue aspirin  and Crestor      Estimated body mass index is 17.34 kg/m as calculated from the following:   Height as of this encounter: 5' 3 (1.6 m).   Weight as of this encounter: 44.4 kg.          Consultants: CCM Procedures performed: None  Disposition: Home Diet recommendation:  Discharge Diet Orders (From admission, onward)     Start     Ordered   10/08/23 0000  Diet - low sodium heart healthy        10/08/23 1138           Cardiac diet DISCHARGE MEDICATION: Allergies as of 10/08/2023       Reactions   Ace Inhibitors Itching, Swelling, Other (See Comments)   Site of swelling not noted   Lisinopril Anaphylaxis, Swelling, Other (See Comments)   Tongue swells   Penicillins Shortness Of Breath, Itching, Swelling   Tolerates Cephalosporins   Amoxicillin Itching, Swelling   Sulfa Antibiotics Itching, Swelling   Tape Itching, Other (See Comments)   CAUSES EXTREME ITCHING        Medication List     STOP taking these medications    amitriptyline 50 MG tablet Commonly known as: ELAVIL   ergocalciferol 1.25 MG (50000 UT) capsule Commonly known as: VITAMIN D2   Fish Oil 1000 MG Caps   hydrALAZINE  50 MG tablet Commonly known as:  APRESOLINE    losartan -hydrochlorothiazide  50-12.5 MG tablet Commonly known as: HYZAAR   potassium chloride  SA 20 MEQ tablet Commonly known as: KLOR-CON  M   tamsulosin 0.4 MG Caps capsule Commonly known as: FLOMAX   traMADol  100 MG 24 hr tablet Commonly known as: ULTRAM -ER       TAKE these medications    acetaminophen  500 MG tablet Commonly known as: TYLENOL  Take  500 mg by mouth every 4 (four) hours as needed (pain).   albuterol  108 (90 Base) MCG/ACT inhaler Commonly known as: VENTOLIN  HFA Inhale 2 puffs into the lungs 3 (three) times daily as needed for wheezing or shortness of breath.   amLODipine  10 MG tablet Commonly known as: NORVASC  Take 1 tablet (10 mg total) by mouth at bedtime.   aspirin  EC 81 MG tablet Take 81 mg by mouth daily.   cefdinir  300 MG capsule Commonly known as: OMNICEF  Take 1 capsule (300 mg total) by mouth 2 (two) times daily for 2 days.   Ensure Plus Liqd Take 237 mLs by mouth 3 (three) times daily between meals.   EPINEPHrine  0.3 mg/0.3 mL Soaj injection Commonly known as: EPI-PEN Inject 0.3 mg into the muscle once as needed for anaphylaxis.   guaiFENesin  600 MG 12 hr tablet Commonly known as: MUCINEX  Take 2 tablets (1,200 mg total) by mouth 2 (two) times daily.   ipratropium-albuterol  0.5-2.5 (3) MG/3ML Soln Commonly known as: DUONEB Take 3 mLs by nebulization 4 (four) times daily.   magnesium  oxide 400 (240 Mg) MG tablet Commonly known as: MAG-OX Take 1 tablet (400 mg total) by mouth 2 (two) times daily.   metoprolol  succinate 100 MG 24 hr tablet Commonly known as: TOPROL -XL Take 100 mg by mouth daily.   multivitamin with minerals Tabs tablet Take 1 tablet by mouth daily. Start taking on: October 09, 2023   Oxcarbazepine  300 MG tablet Commonly known as: TRILEPTAL  Take 1 tablet (300 mg total) by mouth 2 (two) times daily. What changed: Another medication with the same name was removed. Continue taking this medication, and follow the directions you see here.   oxyCODONE  5 MG immediate release tablet Commonly known as: Roxicodone  Take 1 tablet (5 mg total) by mouth every 4 (four) hours as needed for up to 3 days for severe pain (pain score 7-10).   pantoprazole  40 MG tablet Commonly known as: PROTONIX  Take 40 mg by mouth daily.   rosuvastatin  5 MG tablet Commonly known as: CRESTOR  Take 5 mg by  mouth daily.   Teriflunomide  7 MG Tabs Take 7 mg by mouth at bedtime.   traZODone  50 MG tablet Commonly known as: DESYREL  Take 100 mg by mouth at bedtime as needed for sleep.        Discharge Exam: General; NAD Lungs CTA  Condition at discharge: stable  The results of significant diagnostics from this hospitalization (including imaging, microbiology, ancillary and laboratory) are listed below for reference.   Imaging Studies: DG Chest Port 1 View Result Date: 10/04/2023 EXAM: 1 VIEW XRAY OF THE CHEST 10/04/2023 04:57:00 AM COMPARISON: 10/03/2023 CLINICAL HISTORY: Respiratory failure (HCC) FINDINGS: LUNGS AND PLEURA: Scattered interstitial and airspace opacities in both lung bases, left greater than right, with some decrease in the more dense consolidation seen previously. Improved lung volumes. Blunting of left lateral costophrenic angle. HEART AND MEDIASTINUM: Atheromatous aorta. BONES AND SOFT TISSUES: No acute osseous abnormality. IMPRESSION: 1. Scattered interstitial and airspace opacities in both lung bases, left greater than right, with some decrease in the more dense  consolidation seen previously. 2. Blunting of left lateral costophrenic angle. Electronically signed by: Dayne Hassell MD 10/04/2023 07:28 AM EDT RP Workstation: [id number]   DG CHEST PORT 1 VIEW Result Date: 10/03/2023 EXAM: 1 VIEW XRAY OF THE CHEST 10/03/2023 08:30:00 AM COMPARISON: 04/28/2023 CLINICAL HISTORY: Acute on chronic respiratory failure with hypoxia. Per chart pt has had watery nonbloody diarrhea up to 3 times a day for the last couple of weeks. FINDINGS: LUNGS AND PLEURA: New airspace opacities at both lung bases with possible associated small pleural effusions. HEART AND MEDIASTINUM: Atheromatous aorta. BONES AND SOFT TISSUES: No acute osseous abnormality. IMPRESSION: 1. New airspace opacities at both lung bases with possible associated small pleural effusions, compared to 04/28/2023. 2. Atheromatous aorta.  Electronically signed by: Katheleen Faes MD 10/03/2023 09:00 AM EDT RP Workstation: [id number]   US  EKG SITE RITE Result Date: 10/02/2023 If Site Rite image not attached, placement could not be confirmed due to current cardiac rhythm.   Microbiology: Results for orders placed or performed during the hospital encounter of 09/30/23  MRSA Next Gen by PCR, Nasal     Status: None   Collection Time: 10/01/23  4:54 AM   Specimen: Nasal Mucosa; Nasal Swab  Result Value Ref Range Status   MRSA by PCR Next Gen NOT DETECTED NOT DETECTED Final    Comment: (NOTE) The GeneXpert MRSA Assay (FDA approved for NASAL specimens only), is one component of a comprehensive MRSA colonization surveillance program. It is not intended to diagnose MRSA infection nor to guide or monitor treatment for MRSA infections. Test performance is not FDA approved in patients less than 50 years old. Performed at [facility], [address], [place], [place] [address]   Expectorated Sputum Assessment w Gram Stain, Rflx to Resp Cult     Status: None   Collection Time: 10/04/23  5:16 PM   Specimen: Sputum  Result Value Ref Range Status   Specimen Description SPU  Final   Special Requests NONE  Final   Sputum evaluation   Final    Sputum specimen not acceptable for testing.  Please recollect.   NOTIFIED DEBARAH SAILOR RN AT 8164 ON 10/04/2023 BY PRUDY POUR Performed at [facility], [address], [place], [place] [address]    Report Status 10/04/2023 FINAL  Final    Labs: CBC: Recent Labs  Lab 10/02/23 0343 10/03/23 0758 10/04/23 0901 10/05/23 0309 10/06/23 0310  WBC 8.2 11.4* 14.1* 10.9* 8.0  NEUTROABS  --  9.2* 12.3* 9.0* 5.6  HGB 12.2 11.1* 10.3* 9.7* 9.2*  HCT 36.9 33.6* 31.3* 30.4* 27.9*  MCV 92.0 93.3 93.2 93.8 90.0  PLT 305 265 213 190 196   Basic Metabolic Panel: Recent Labs  Lab 10/01/23 1533 10/02/23 0343 10/04/23 0901 10/05/23 0309 10/06/23 0310 10/07/23 1133  10/08/23 1009  NA 139   < > 135 135 132* 133* 132*  K 2.8*   < > 2.9* 5.1 3.3* 3.4* 4.1  CL 105   < > 97* 101 92* 95* 97*  CO2 23   < > 26 22 27 27 25   GLUCOSE 113*   < > 122* 116* 104* 156* 174*  BUN <5*   < > 7* 8 8 8 9   CREATININE 0.73   < > 0.48 0.52 0.40* 0.67 0.61  CALCIUM  8.2*   < > 8.8* 8.9 9.0 9.0 9.1  MG 1.8   < > 1.9 1.8 1.8 1.5* 2.2  PHOS 2.9  --   --   --   --   --   --    < > =  values in this interval not displayed.   Liver Function Tests: No results for input(s): AST, ALT, ALKPHOS, BILITOT, PROT, ALBUMIN in the last 168 hours. CBG: No results for input(s): GLUCAP in the last 168 hours.  Discharge time spent: greater than 30 minutes.  Signed: Owen DELENA Lore, MD [facility] 10/08/2023

## 2023-10-08 NOTE — TOC Transition Note (Signed)
 Transition of Care [facility]) - Discharge Note   Patient Details  Name: Shawna Smith MRN: [id number] Date of Birth: 1955/08/19  [facility]) CM/SW Contact:  NORMAN ASPEN, LCSW Phone Number: 10/08/2023, 2:40 PM   Clinical Narrative:     Pt medically cleared for dc home today.  Orders placed for [facility] and home nebulizer maching.  Pt aware and does not have any agency preferences.  Home nebulizer order placed with [facility] for delivery to home.  HHPT/OT/aide referral placed with [facility].  Pt aware.  No further TOC needs.  Final next level of care: Home w Home Health Services Barriers to Discharge: Barriers Resolved   Patient Goals and CMS Choice Patient states their goals for this hospitalization and ongoing recovery are:: return home   Choice offered to / list presented to : NA      Discharge Placement                       Discharge Plan and Services Additional resources added to the After Visit Summary for   In-house Referral: NA Discharge Planning Services: NA            DME Arranged: Nebulizer machine DME Agency: [facility] Date DME Agency Contacted: 10/08/23 Time DME Agency Contacted: [phone] Representative spoke with at DME Agency: Darlyn HH Arranged: PT, OT, Nurse's Aide HH Agency: [facility] Care Date [facility] Agency Contacted: 10/08/23 Time HH Agency Contacted: [phone] Representative spoke with at [facility] Agency: Darleene  Social Drivers of Health (SDOH) Interventions SDOH Screenings   Food Insecurity: No Food Insecurity (10/01/2023)  Housing: Low Risk  (10/01/2023)  Transportation Needs: No Transportation Needs (10/01/2023)  Utilities: Not At Risk (10/01/2023)  Financial Resource Strain: Medium Risk (03/18/2018)   Received from [facility] visits prior to 05/02/2022.  Social Connections: Socially Isolated (10/01/2023)  Tobacco Use: High Risk (10/01/2023)     Readmission Risk Interventions    10/02/2023    1:19 PM  Readmission Risk  Prevention Plan  Transportation Screening Complete  PCP or Specialist Appt within 5-7 Days Complete  Home Care Screening Complete  Medication Review (RN CM) Complete

## 2024-01-18 ENCOUNTER — Other Ambulatory Visit: Payer: Self-pay

## 2024-01-29 IMAGING — CT CT ABD-PELV W/ CM
2 of 5 series · 14 of 46 positions shown, 16 images · IV contrast (Omnipaque)
Comparison: CT abdomen pelvis 02/20/2013

CLINICAL DATA: RLQ abdominal pain (Age >= 14y). Four falls last
night

EXAM:
CT ABDOMEN AND PELVIS WITH CONTRAST
TECHNIQUE: Multidetector CT imaging of the abdomen and pelvis was performed
using the standard protocol following bolus administration of
intravenous contrast.

[Series 2: axial st · axial · 0.79mm/px · z∈[-460,-86]mm · 11 of 85 slices shown, 13 images]
[im 5/85  soft-tissue]
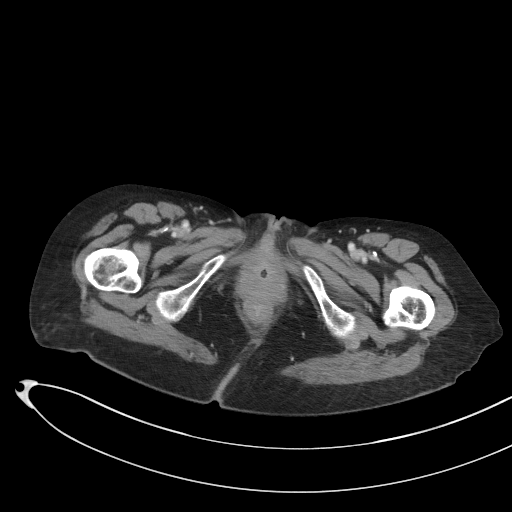
[im 5/85  bone]
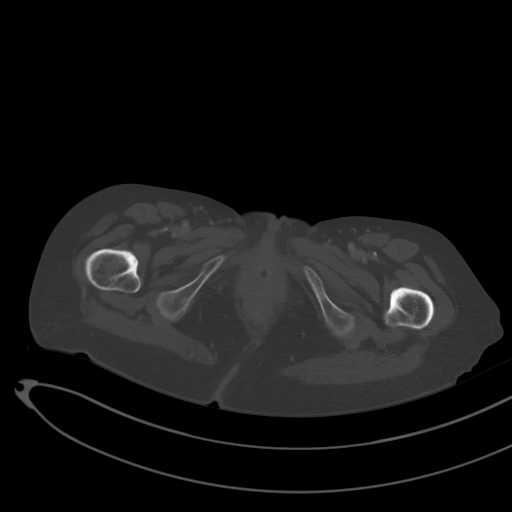
[im 15/85  soft-tissue]
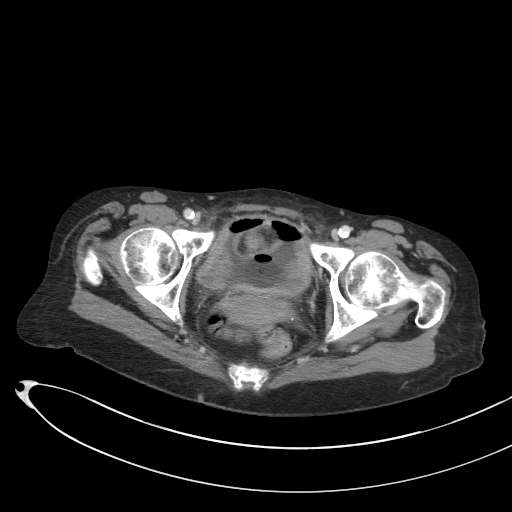
[im 19/85  soft-tissue]
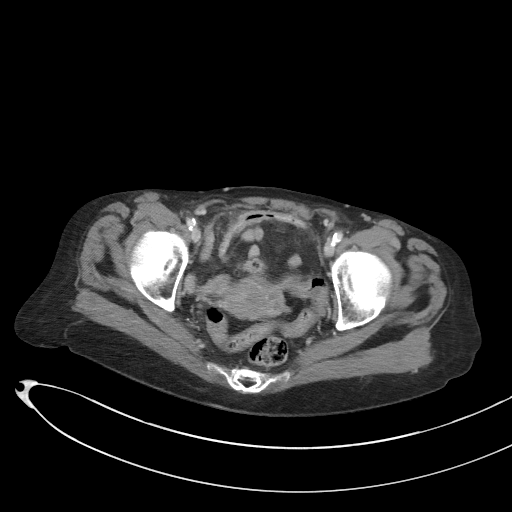
[im 29/85  soft-tissue]
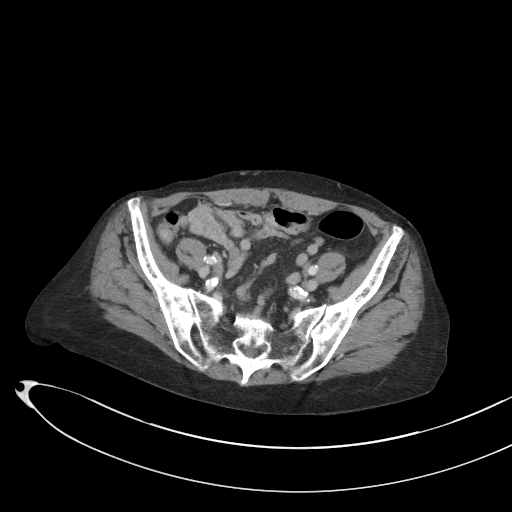
[im 33/85  soft-tissue]
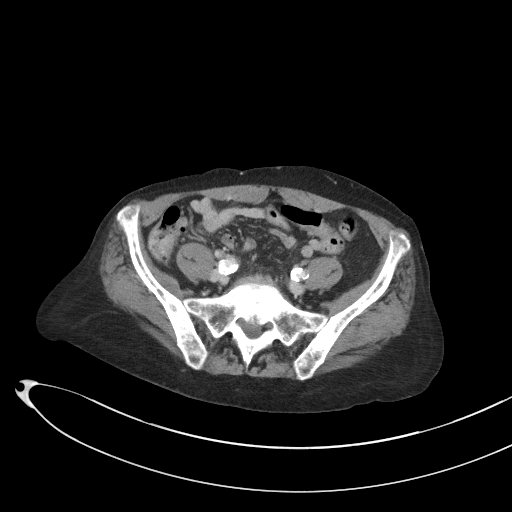
[im 43/85  soft-tissue]
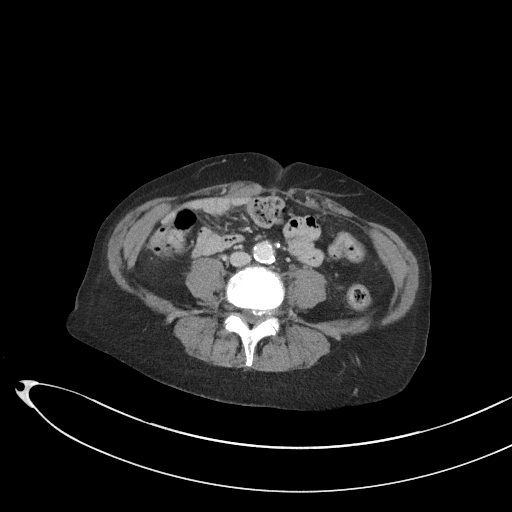
[im 52/85  soft-tissue]
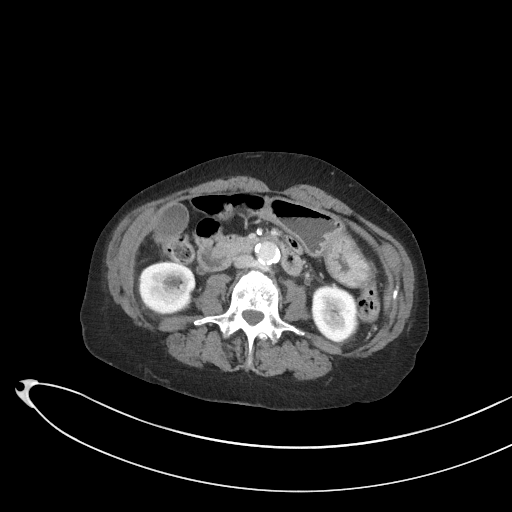
[im 57/85  soft-tissue]
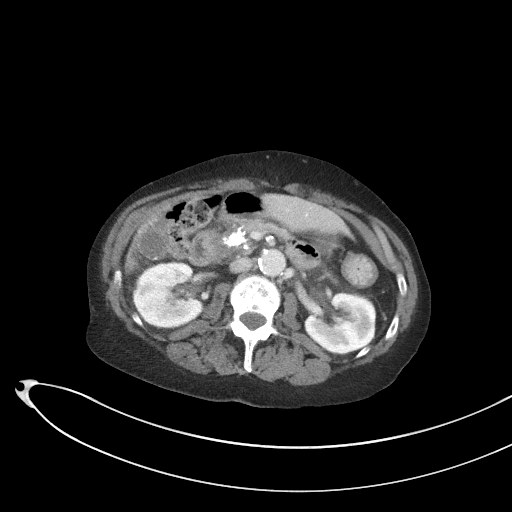
[im 66/85  soft-tissue]
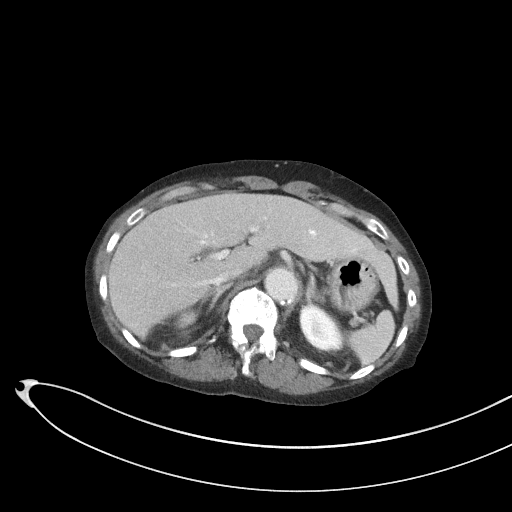
[im 66/85  bone]
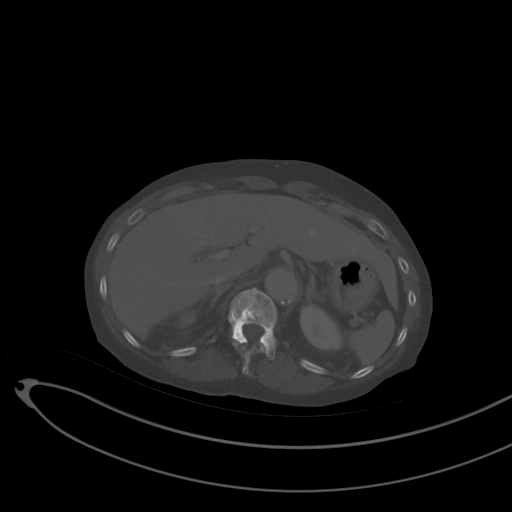
[im 71/85  soft-tissue]
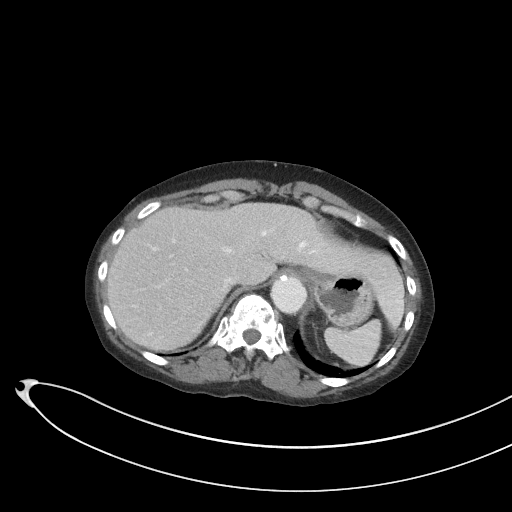
[im 80/85  soft-tissue]
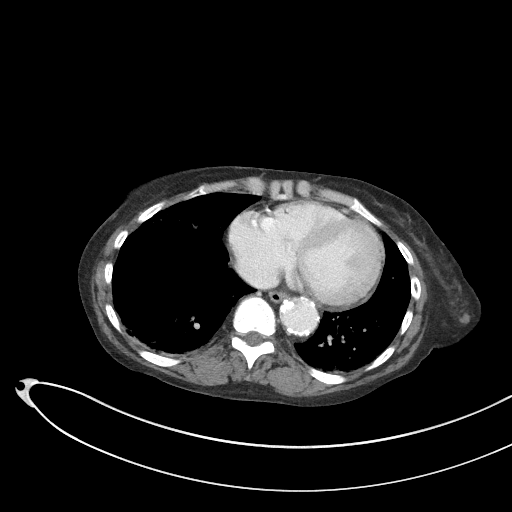

[Series 5: coronal st · coronal · 0.75mm/px · 3 of 80 slices shown]
[im 27/80  soft-tissue]
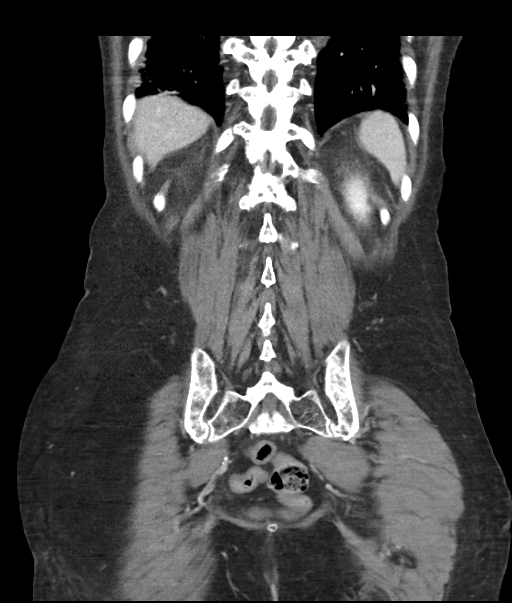
[im 36/80  soft-tissue]
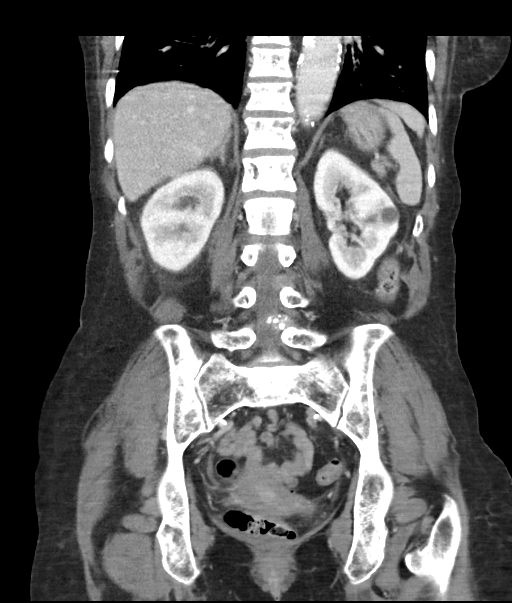
[im 44/80  soft-tissue]
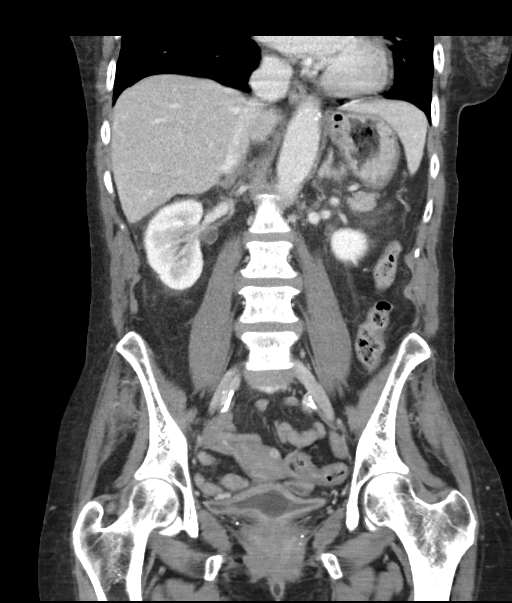

[14 of 46 positions shown; findings below may reference images not displayed]

RADIATION DOSE REDUCTION: This exam was performed according to the
departmental dose-optimization program which includes automated
exposure control, adjustment of the mA and/or kV according to
patient size and/or use of iterative reconstruction technique.

CONTRAST:  75mL OMNIPAQUE IOHEXOL 300 MG/ML  SOLN
FINDINGS: Lower chest: Subpleural right middle lobe consolidation.

Liver: Not enlarged. No focal lesion. No laceration or subcapsular
hematoma.

Biliary System: Calcified gallstones within the gallbladder lumen.
No gallbladder wall thickening or pericholecystic fluid. No biliary
ductal dilatation.

Pancreas: Interval increase of dystrophic calcification along the
proximal pancreas with associated query underlying hypodensity
likely represent focal pancreatic duct dilatation ([DATE] 7-31). No
main pancreatic duct dilatation.

Spleen: Not enlarged. No focal lesion. No laceration, subcapsular
hematoma, or vascular injury.

Adrenal Glands: No nodularity bilaterally.

Kidneys:

Bilateral kidneys enhance symmetrically. Bilateral urothelial
thickening. Interval increase in size of a 1.4 cm (from 0.9 cm)
hypodense lesion within left kidney with a density of 30 Hounsfield
units. No hydronephrosis. No contusion, laceration, or subcapsular
hematoma.

No injury to the vascular structures or collecting systems. No
hydroureter.

The urinary bladder is decompressed with Foley catheter terminating
within its lumen.

Bowel: No small or large bowel wall thickening or dilatation. The
appendix is unremarkable.

Mesentery, Omentum, and Peritoneum: No simple free fluid ascites. No
pneumoperitoneum. No hemoperitoneum. No mesenteric hematoma
identified. No organized fluid collection.

Pelvic Organs: Rounded hyperdensity within the uterus likely
represents a fibroid. Otherwise uterus and bilateral adnexal regions
unremarkable.

Lymph Nodes: No abdominal, pelvic, inguinal lymphadenopathy.

Vasculature: Severe atherosclerotic plaque. No abdominal aorta or
iliac aneurysm. No active contrast extravasation or pseudoaneurysm.

Musculoskeletal:

No significant soft tissue hematoma.

No acute pelvic fracture. Chronic sclerotic expansile lesion of the
right L1 pedicle that is likely congenital versus may represent a
benign lesion. Atrophic appearing associated right transverse
process.
IMPRESSION: 1. Subpleural right middle lobe consolidation. Recommend correlation
with chest x-ray.
2. Bilateral urothelial thickening. Correlate with urinalysis for
infection.
3. No acute traumatic injury to the abdomen or pelvis.

4. No acute fracture or traumatic malalignment of the  lumbar spine.

Other imaging findings of potential clinical significance

1. Interval increase of dystrophic calcification along the proximal
pancreas with associated query underlying hypodensity likely
represent focal pancreatic duct dilatation. Finding may be due to
chronic pancreatitis versus mass lesion. Recommend MRI pancreatic
protocol for further evaluation. No definite findings of acute
pancreatitis slightly limited evaluation due to streak artifact.
When the patient is clinically stable and able to follow directions
and hold their breath (preferably as an outpatient) further
evaluation with dedicated abdominal MRI should be considered.
2. Indeterminate 1.4 cm left renal lesion-finding could represent a
complex renal cyst versus solid mass. This can further be evaluated
on MRI pancreatic protocol obtained.
3. Cholelithiasis.
4. Uterine fibroid.
5.  Aortic Atherosclerosis ([id number]) - severe.

## 2024-02-02 ENCOUNTER — Other Ambulatory Visit: Payer: Self-pay
